# Patient Record
Sex: Female | Born: 1965
Health system: Southern US, Community
[De-identification: ages and names within clinical notes are randomized; demographics above are authoritative.]

## PROBLEM LIST (undated history)

## (undated) DIAGNOSIS — L209 Atopic dermatitis, unspecified: Secondary | ICD-10-CM

## (undated) DIAGNOSIS — D1803 Hemangioma of intra-abdominal structures: Secondary | ICD-10-CM

## (undated) DIAGNOSIS — N8003 Adenomyosis of the uterus: Secondary | ICD-10-CM

## (undated) DIAGNOSIS — G43909 Migraine, unspecified, not intractable, without status migrainosus: Secondary | ICD-10-CM

## (undated) DIAGNOSIS — I10 Essential (primary) hypertension: Secondary | ICD-10-CM

## (undated) DIAGNOSIS — R110 Nausea: Secondary | ICD-10-CM

## (undated) DIAGNOSIS — M7650 Patellar tendinitis, unspecified knee: Secondary | ICD-10-CM

## (undated) DIAGNOSIS — Z8742 Personal history of other diseases of the female genital tract: Secondary | ICD-10-CM

## (undated) DIAGNOSIS — I808 Phlebitis and thrombophlebitis of other sites: Secondary | ICD-10-CM

## (undated) DIAGNOSIS — E7800 Pure hypercholesterolemia, unspecified: Secondary | ICD-10-CM

## (undated) DIAGNOSIS — N8000 Endometriosis of the uterus, unspecified: Secondary | ICD-10-CM

## (undated) DIAGNOSIS — R140 Abdominal distension (gaseous): Secondary | ICD-10-CM

## (undated) DIAGNOSIS — E559 Vitamin D deficiency, unspecified: Secondary | ICD-10-CM

## (undated) DIAGNOSIS — G4730 Sleep apnea, unspecified: Secondary | ICD-10-CM

## (undated) DIAGNOSIS — K589 Irritable bowel syndrome without diarrhea: Secondary | ICD-10-CM

## (undated) DIAGNOSIS — R7303 Prediabetes: Secondary | ICD-10-CM

## (undated) DIAGNOSIS — R079 Chest pain, unspecified: Secondary | ICD-10-CM

## (undated) DIAGNOSIS — K769 Liver disease, unspecified: Secondary | ICD-10-CM

## (undated) DIAGNOSIS — Z8632 Personal history of gestational diabetes: Secondary | ICD-10-CM

## (undated) DIAGNOSIS — Z780 Asymptomatic menopausal state: Secondary | ICD-10-CM

## (undated) DIAGNOSIS — K5900 Constipation, unspecified: Secondary | ICD-10-CM

## (undated) DIAGNOSIS — Z7251 High risk heterosexual behavior: Secondary | ICD-10-CM

## (undated) DIAGNOSIS — K219 Gastro-esophageal reflux disease without esophagitis: Secondary | ICD-10-CM

## (undated) DIAGNOSIS — R202 Paresthesia of skin: Secondary | ICD-10-CM

## (undated) DIAGNOSIS — N809 Endometriosis, unspecified: Secondary | ICD-10-CM

## (undated) DIAGNOSIS — N393 Stress incontinence (female) (male): Secondary | ICD-10-CM

## (undated) DIAGNOSIS — Z9289 Personal history of other medical treatment: Secondary | ICD-10-CM

## (undated) DIAGNOSIS — R232 Flushing: Secondary | ICD-10-CM

## (undated) DIAGNOSIS — E119 Type 2 diabetes mellitus without complications: Secondary | ICD-10-CM

## (undated) DIAGNOSIS — N281 Cyst of kidney, acquired: Secondary | ICD-10-CM

## (undated) HISTORY — DX: Stress incontinence (female) (male): N39.3

## (undated) HISTORY — DX: Adenomyosis of the uterus: N80.03

## (undated) HISTORY — DX: Endometriosis of uterus: N80.0

## (undated) HISTORY — DX: Phlebitis and thrombophlebitis of other sites: I80.8

## (undated) HISTORY — DX: Essential (primary) hypertension: I10

## (undated) HISTORY — DX: Liver disease, unspecified: K76.9

## (undated) HISTORY — DX: Migraine, unspecified, not intractable, without status migrainosus: G43.909

## (undated) HISTORY — DX: Gastro-esophageal reflux disease without esophagitis: K21.9

## (undated) HISTORY — DX: Cyst of kidney, acquired: N28.1

## (undated) HISTORY — DX: Sleep apnea, unspecified: G47.30

## (undated) HISTORY — DX: Endometriosis of the uterus, unspecified: N80.00

## (undated) HISTORY — DX: Pure hypercholesterolemia, unspecified: E78.00

## (undated) HISTORY — DX: Personal history of other medical treatment: Z92.89

## (undated) HISTORY — DX: Asymptomatic menopausal state: Z78.0

## (undated) HISTORY — DX: Type 2 diabetes mellitus without complications: E11.9

## (undated) HISTORY — DX: Flushing: R23.2

## (undated) HISTORY — PX: OTHER SURGICAL HISTORY: SHX169

## (undated) HISTORY — DX: Irritable bowel syndrome, unspecified: K58.9

## (undated) HISTORY — DX: Constipation, unspecified: K59.00

## (undated) HISTORY — DX: Personal history of gestational diabetes: Z86.32

## (undated) HISTORY — DX: Patellar tendinitis, unspecified knee: M76.50

## (undated) HISTORY — DX: Atopic dermatitis, unspecified: L20.9

## (undated) HISTORY — DX: Personal history of other diseases of the female genital tract: Z87.42

## (undated) HISTORY — DX: Endometriosis, unspecified: N80.9

## (undated) HISTORY — DX: Prediabetes: R73.03

## (undated) HISTORY — DX: Chest pain, unspecified: R07.9

## (undated) HISTORY — PX: TUBAL LIGATION: SHX77

## (undated) HISTORY — DX: Vitamin D deficiency, unspecified: E55.9

## (undated) HISTORY — DX: Hemangioma of intra-abdominal structures: D18.03

---
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
## 1997-11-19 ENCOUNTER — Encounter: Admission: RE | Admit: 1997-11-19 | Discharge: 1997-11-19 | Payer: Self-pay | Admitting: Family Medicine

## 1997-12-11 ENCOUNTER — Encounter: Admission: RE | Admit: 1997-12-11 | Discharge: 1997-12-11 | Payer: Self-pay | Admitting: Family Medicine

## 1998-01-01 ENCOUNTER — Encounter: Admission: RE | Admit: 1998-01-01 | Discharge: 1998-01-01 | Payer: Self-pay | Admitting: Family Medicine

## 1998-01-06 ENCOUNTER — Encounter: Admission: RE | Admit: 1998-01-06 | Discharge: 1998-01-06 | Payer: Self-pay | Admitting: Family Medicine

## 1998-05-14 ENCOUNTER — Encounter: Admission: RE | Admit: 1998-05-14 | Discharge: 1998-05-14 | Payer: Self-pay | Admitting: Family Medicine

## 2000-04-04 DIAGNOSIS — Z9289 Personal history of other medical treatment: Secondary | ICD-10-CM

## 2000-04-04 HISTORY — DX: Personal history of other medical treatment: Z92.89

## 2000-05-05 HISTORY — PX: COLONOSCOPY: SHX174

## 2000-12-03 HISTORY — PX: CARDIOVASCULAR STRESS TEST: SHX262

## 2000-12-03 HISTORY — PX: SPIROMETRY: SHX456

## 2002-06-06 ENCOUNTER — Encounter: Admission: RE | Admit: 2002-06-06 | Discharge: 2002-06-06 | Payer: Self-pay | Admitting: Family Medicine

## 2002-06-19 ENCOUNTER — Encounter: Admission: RE | Admit: 2002-06-19 | Discharge: 2002-06-19 | Payer: Self-pay | Admitting: Family Medicine

## 2002-07-04 ENCOUNTER — Encounter: Admission: RE | Admit: 2002-07-04 | Discharge: 2002-07-04 | Payer: Self-pay | Admitting: Family Medicine

## 2002-07-11 ENCOUNTER — Encounter: Admission: RE | Admit: 2002-07-11 | Discharge: 2002-07-11 | Payer: Self-pay | Admitting: Family Medicine

## 2002-07-25 ENCOUNTER — Encounter: Admission: RE | Admit: 2002-07-25 | Discharge: 2002-07-25 | Payer: Self-pay | Admitting: Family Medicine

## 2002-11-11 ENCOUNTER — Encounter: Admission: RE | Admit: 2002-11-11 | Discharge: 2002-11-11 | Payer: Self-pay | Admitting: Family Medicine

## 2002-12-05 ENCOUNTER — Encounter: Admission: RE | Admit: 2002-12-05 | Discharge: 2002-12-05 | Payer: Self-pay | Admitting: Family Medicine

## 2003-08-07 ENCOUNTER — Encounter: Payer: Self-pay | Admitting: Family Medicine

## 2003-08-07 ENCOUNTER — Encounter: Admission: RE | Admit: 2003-08-07 | Discharge: 2003-08-07 | Payer: Self-pay | Admitting: Family Medicine

## 2004-01-15 ENCOUNTER — Ambulatory Visit: Payer: Self-pay | Admitting: Family Medicine

## 2005-02-02 ENCOUNTER — Encounter (INDEPENDENT_AMBULATORY_CARE_PROVIDER_SITE_OTHER): Payer: Self-pay | Admitting: *Deleted

## 2005-02-02 LAB — CONVERTED CEMR LAB

## 2005-02-04 ENCOUNTER — Ambulatory Visit: Payer: Self-pay | Admitting: Family Medicine

## 2005-02-21 ENCOUNTER — Encounter: Payer: Self-pay | Admitting: Family Medicine

## 2005-02-21 ENCOUNTER — Ambulatory Visit: Payer: Self-pay | Admitting: Family Medicine

## 2005-03-31 ENCOUNTER — Ambulatory Visit: Payer: Self-pay | Admitting: Family Medicine

## 2005-04-05 ENCOUNTER — Ambulatory Visit: Payer: Self-pay | Admitting: Sports Medicine

## 2005-04-19 ENCOUNTER — Encounter: Admission: RE | Admit: 2005-04-19 | Discharge: 2005-04-19 | Payer: Self-pay | Admitting: Sports Medicine

## 2006-02-20 ENCOUNTER — Ambulatory Visit: Payer: Self-pay | Admitting: Family Medicine

## 2006-06-01 DIAGNOSIS — E739 Lactose intolerance, unspecified: Secondary | ICD-10-CM | POA: Insufficient documentation

## 2006-06-01 DIAGNOSIS — I10 Essential (primary) hypertension: Secondary | ICD-10-CM | POA: Insufficient documentation

## 2006-06-01 DIAGNOSIS — G43909 Migraine, unspecified, not intractable, without status migrainosus: Secondary | ICD-10-CM | POA: Insufficient documentation

## 2006-06-01 DIAGNOSIS — K580 Irritable bowel syndrome with diarrhea: Secondary | ICD-10-CM | POA: Insufficient documentation

## 2006-06-01 DIAGNOSIS — K589 Irritable bowel syndrome without diarrhea: Secondary | ICD-10-CM | POA: Insufficient documentation

## 2006-06-01 DIAGNOSIS — E7800 Pure hypercholesterolemia, unspecified: Secondary | ICD-10-CM | POA: Insufficient documentation

## 2006-06-01 HISTORY — DX: Migraine, unspecified, not intractable, without status migrainosus: G43.909

## 2006-06-02 ENCOUNTER — Encounter (INDEPENDENT_AMBULATORY_CARE_PROVIDER_SITE_OTHER): Payer: Self-pay | Admitting: *Deleted

## 2006-10-02 ENCOUNTER — Telehealth (INDEPENDENT_AMBULATORY_CARE_PROVIDER_SITE_OTHER): Payer: Self-pay | Admitting: *Deleted

## 2006-10-09 ENCOUNTER — Ambulatory Visit: Payer: Self-pay | Admitting: Family Medicine

## 2006-10-09 ENCOUNTER — Ambulatory Visit (HOSPITAL_COMMUNITY): Admission: RE | Admit: 2006-10-09 | Discharge: 2006-10-09 | Payer: Self-pay | Admitting: Family Medicine

## 2006-10-11 ENCOUNTER — Encounter: Payer: Self-pay | Admitting: Family Medicine

## 2006-12-18 ENCOUNTER — Encounter: Payer: Self-pay | Admitting: *Deleted

## 2007-02-05 ENCOUNTER — Telehealth: Payer: Self-pay | Admitting: Family Medicine

## 2007-02-15 ENCOUNTER — Ambulatory Visit: Payer: Self-pay | Admitting: Family Medicine

## 2007-02-15 ENCOUNTER — Encounter: Payer: Self-pay | Admitting: Family Medicine

## 2007-02-15 LAB — CONVERTED CEMR LAB
Calcium: 9.7 mg/dL (ref 8.4–10.5)
Chlamydia, DNA Probe: NEGATIVE
GC Probe Amp, Genital: NEGATIVE
Hepatitis B Surface Ag: NEGATIVE
Potassium: 3.3 meq/L — ABNORMAL LOW (ref 3.5–5.3)
Whiff Test: POSITIVE

## 2007-02-16 ENCOUNTER — Encounter: Payer: Self-pay | Admitting: Family Medicine

## 2007-02-19 ENCOUNTER — Encounter: Payer: Self-pay | Admitting: Family Medicine

## 2007-06-04 ENCOUNTER — Emergency Department (HOSPITAL_COMMUNITY): Admission: EM | Admit: 2007-06-04 | Discharge: 2007-06-04 | Payer: Self-pay | Admitting: Emergency Medicine

## 2007-10-25 ENCOUNTER — Telehealth: Payer: Self-pay | Admitting: *Deleted

## 2007-10-26 ENCOUNTER — Ambulatory Visit: Payer: Self-pay | Admitting: Family Medicine

## 2007-11-01 ENCOUNTER — Encounter: Payer: Self-pay | Admitting: *Deleted

## 2008-01-11 ENCOUNTER — Encounter: Admission: RE | Admit: 2008-01-11 | Discharge: 2008-01-11 | Payer: Self-pay | Admitting: Family Medicine

## 2008-02-05 ENCOUNTER — Telehealth: Payer: Self-pay | Admitting: *Deleted

## 2008-02-22 DIAGNOSIS — E559 Vitamin D deficiency, unspecified: Secondary | ICD-10-CM | POA: Insufficient documentation

## 2008-02-22 LAB — CONVERTED CEMR LAB
Albumin: 4.6 g/dL
Alkaline Phosphatase: 44 units/L
CO2: 22 meq/L
Chlamydia, DNA Probe: NEGATIVE
Cholesterol: 256 mg/dL
Creatinine, Ser: 0.71 mg/dL
Glucose, Bld: 78 mg/dL
HDL: 85 mg/dL
Hep A IgM: NEGATIVE
Platelets: 259 10*3/uL
Potassium: 4.1 meq/L
RBC: 4.63 M/uL
RPR Ser Ql: NONREACTIVE
Sodium: 138 meq/L
T3 Uptake Ratio: 31 %
Total Protein: 8.1 g/dL
Triglycerides: 60 mg/dL

## 2008-03-03 ENCOUNTER — Ambulatory Visit: Payer: Self-pay | Admitting: Family Medicine

## 2008-09-04 ENCOUNTER — Ambulatory Visit: Payer: Self-pay | Admitting: Family Medicine

## 2008-09-04 ENCOUNTER — Telehealth (INDEPENDENT_AMBULATORY_CARE_PROVIDER_SITE_OTHER): Payer: Self-pay | Admitting: *Deleted

## 2008-09-04 ENCOUNTER — Telehealth: Payer: Self-pay | Admitting: *Deleted

## 2008-09-10 ENCOUNTER — Ambulatory Visit: Payer: Self-pay | Admitting: Family Medicine

## 2008-09-10 ENCOUNTER — Telehealth: Payer: Self-pay | Admitting: Family Medicine

## 2008-10-02 ENCOUNTER — Encounter: Payer: Self-pay | Admitting: Family Medicine

## 2008-10-02 ENCOUNTER — Ambulatory Visit: Payer: Self-pay | Admitting: Family Medicine

## 2009-06-30 ENCOUNTER — Telehealth: Payer: Self-pay | Admitting: Family Medicine

## 2009-07-15 ENCOUNTER — Encounter: Payer: Self-pay | Admitting: Family Medicine

## 2009-07-15 ENCOUNTER — Encounter: Admission: RE | Admit: 2009-07-15 | Discharge: 2009-07-15 | Payer: Self-pay | Admitting: Obstetrics and Gynecology

## 2009-07-31 ENCOUNTER — Ambulatory Visit: Payer: Self-pay | Admitting: Family Medicine

## 2009-07-31 ENCOUNTER — Encounter: Payer: Self-pay | Admitting: Family Medicine

## 2009-07-31 DIAGNOSIS — R5381 Other malaise: Secondary | ICD-10-CM | POA: Insufficient documentation

## 2009-07-31 DIAGNOSIS — R5383 Other fatigue: Secondary | ICD-10-CM

## 2009-07-31 DIAGNOSIS — M549 Dorsalgia, unspecified: Secondary | ICD-10-CM | POA: Insufficient documentation

## 2009-08-03 DIAGNOSIS — R7309 Other abnormal glucose: Secondary | ICD-10-CM | POA: Insufficient documentation

## 2009-08-03 LAB — CONVERTED CEMR LAB
Albumin: 4.2 g/dL (ref 3.5–5.2)
BUN: 15 mg/dL (ref 6–23)
Calcium: 9.4 mg/dL (ref 8.4–10.5)
Chloride: 103 meq/L (ref 96–112)
Creatinine, Ser: 0.95 mg/dL (ref 0.40–1.20)
MCHC: 34.1 g/dL (ref 30.0–36.0)
Potassium: 3.6 meq/L (ref 3.5–5.3)
Sodium: 139 meq/L (ref 135–145)
TSH: 0.471 microintl units/mL (ref 0.350–4.500)
Total Protein: 7.2 g/dL (ref 6.0–8.3)
WBC: 6.5 10*3/uL (ref 4.0–10.5)

## 2010-05-02 LAB — CONVERTED CEMR LAB
ALT: 16 units/L (ref 0–35)
BUN: 11 mg/dL (ref 6–23)
Bilirubin Urine: NEGATIVE
Chloride: 104 meq/L (ref 96–112)
Ketones, urine, test strip: NEGATIVE
Potassium: 4.3 meq/L (ref 3.5–5.3)
Total Protein: 7.5 g/dL (ref 6.0–8.3)
pH: 7

## 2010-05-04 NOTE — Progress Notes (Signed)
Summary: Rx Req  Phone Note Refill Request Call back at [phone] Message from:  Patient  Refills Requested: Medication #1:  HYDROCHLOROTHIAZIDE 25 MG TABS 1 tab by mouth daily. PT USES [facility] IN [place].  Initial call taken by: Clydell Hakim,  June 30, 2009 3:49 PM  Follow-up for Phone Call        to pcp Follow-up by: Theresia Lo RN,  June 30, 2009 4:04 PM    Prescriptions: HYDROCHLOROTHIAZIDE 25 MG TABS (HYDROCHLOROTHIAZIDE) 1 tab by mouth daily.  #30 x 5   Entered and Authorized by:   Tawanna Cooler Morrill Bomkamp MD   Signed by:   Tawanna Cooler Leibish Mcgregor MD on 07/01/2009   Method used:   Electronically to        [facility]. [phone]* (retail)       [address]       [place], [place]  [address]       Ph: [phone] or [phone]       Fax: [phone]   RxID:   [id number]

## 2010-05-04 NOTE — Assessment & Plan Note (Signed)
Summary: Mondor's Disease,tcb   Vital Signs:  Patient profile:   45 year old female Height:      64.5 inches Weight:      166 pounds BMI:     28.16 BSA:     1.82 Temp:     98.3 degrees F Pulse rate:   89 / minute BP sitting:   131 / 88  Vitals Entered By: Jone Baseman [name] (July 31, 2009 1:38 PM) CC: f/u mammogram, upper back pain, fatigue, HTN Is Patient Diabetic? No Pain Assessment Patient in pain? no        Primary Care Provider:  TODD MCDIARMID MD  CC:  f/u mammogram, upper back pain, fatigue, and HTN.  History of Present Illness: mammogram: dx with mondors disease and also rash on breasts.  rash is chronic - improved in past with cream which she doesn't remember the name of.  typically rash is itching and skin looks darker in the area of the rash. no longer having breast pain.  was instructed by mammographer to take asa 325 once daily.  she wishes to stop this if possible.  initially had severe breast pain which prompted assessment in the first place.  denies fevers, overall feeling well with regards to breast health.  knows she has 6 mo follow up of mammogram.   upper back pain: briefly mentioned by patient.  intermittent for about the past 4 months.  occurs randomly, lasts about 2 hrs or so at a time.  probably occurs every other day. denies association with food.  no associated nausea or fevers.  never had this before.  pain is kind of under shoulder blade on right side.  no associated abdominal pain.  no associated shortness of breath.    fatigue: also has noticed for several months.  remembers feeling like this when vitamin D level was low in past.  requesting lab work today to see if again vitamin D deficiency or other laboratory cause.  she denies feeling depressed.  HTN: taking HCTZ as prescribed.  states at home she has noticed some elevation of blood pressure but is suprised is fairly good today.  hasn't previously been keeping an eye on BP at home. denies any  chest pain or dizziness or leg swelling assocaited with antihypertensive meds.  Habits & Providers  Alcohol-Tobacco-Diet     Tobacco Status: never  Current Medications (verified): 1)  Hydrochlorothiazide 25 Mg Tabs (Hydrochlorothiazide) .[name].. 1 Tab By Mouth Daily. 2)  Nystatin 100000 Unit/gm Crea (Nystatin) .... Apply To Affected Area Two Times A Day Until 2-3 Days After Healed and Then As Needed. Disp 120g  Allergies (verified): 1)  Ace Inhibitors  Past History:  Past medical, surgical, family and social histories (including risk factors) reviewed for relevance to current acute and chronic problems.  Past Medical History: Reviewed history from 03/03/2008 and no changes required. Candida vaginitis and intertrigo, Eczema, nonspecific Dx by Vaughan Sine, possibly secondary to ACE Inhibitor [place] 10-yr CVE Risk <= 1%,  Gestational diabetes,  Hx colitis on flex sig Hodges Fam Prac. 2/02,  Overwieght, Snoring Serum Vitamen D 14 ng/mL LOW  (32-100) HSV I & II IgG < 0.9 which means Negative (11/09) FSH =  19.9 mIU/mL c/w ovulation (11/09) LH = 15.2 mIU/mL c/w ovulation (11/09)  Gyn care at [facility]  Past Surgical History: Reviewed history from 10/09/2006 and no changes required. Colonoscopy_DrGupta,Int hemorr. - 05/05/2000,  CXR normal - 12/03/2000,  Flexible Sigmoidoscopy - 05/05/2000, HBV vaccination 3-series completed `93 -  01/03/1992, Lipids 02/20/06 TC=239, LDL=157, HDL=59, Trig=72 - 02/21/2006 PFTs normal - 12/03/2000,  Stress Echocargiogram - 12/03/2000,  Tubal ligation -,  Ur. Bladder `stretched` -  Family History: Reviewed history from 03/03/2008 and no changes required. Aunt & first cousin with colorectal cancer,  Fa died Pancreatic Cancer at age 34 w/ Permanent PM in place; Family History Diabetes 1st degree relative: one brother with DMT2  Social History: Reviewed history from 09/10/2008 and no changes required. Married at age 19yrs old, [id number].    Patient has never had any other sexual partner other than husband though relation has not been mutually monogaqmous. 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 no tobacco no alcohol no illicit drugs  Review of Systems       per HPI  Physical Exam  General:  Well-developed,well-nourished,in no acute distress; alert,appropriate and cooperative throughout examination VS noted Head:  normocephalic and atraumatic.   Breasts:  No mass, nodules, thickening, tenderness, bulging, retraction, inflamation, nipple discharge or skin changes noted.   Lungs:  Normal respiratory effort, chest expands symmetrically. Lungs are clear to auscultation, no crackles or wheezes. Heart:  Normal rate and regular rhythm. S1 and S2 normal without gallop, murmur, click, rub or other extra sounds. Abdomen:  Bowel sounds positive,abdomen soft and non-tender without masses, organomegaly or hernias noted. Msk:  back nontender.  unable to elicit pain patient describes.  Skin:  intergrigo rash under breasts and between breasts bilaterally with associated hyperpigmentation.  Psych:  Oriented X3, normally interactive, good eye contact, not anxious appearing, and not depressed appearing.     Impression & Recommendations:  Problem # 1:  MONDOR'S DISEASE, RIGHT BREAST (ICD-451.89) Assessment Improved can stop ASA.  f/u mammogram as scheduled.  no other screening indicated at this time based on literature.  asa was for pain only.  Orders: [facility]- Est  Level 4 ([address]109)  Problem # 2:  HYPERTENSION, BENIGN SYSTEMIC (ICD-401.1) Assessment: Unchanged  currently at goal.  monitor at home to make sure stays there and doesn't need additional meds. check cmet today as well Her updated medication list for this problem includes:    Hydrochlorothiazide 25 Mg Tabs (Hydrochlorothiazide) .[name]... 1 tab by mouth daily.  Orders: Comp Met-[facility] [phone]) [facility]- Est  Level 4 (99214)  BP today: 131/88 Prior BP:  124/84 (10/02/2008)  Prior 10 Yr Risk Heart Disease: 2 % (10/02/2008)  Labs Reviewed: K+: 4.1 (02/22/2008) Creat: : 0.71 (02/22/2008)   Chol: 256 (02/22/2008)   HDL: 85 (02/22/2008)   LDL: 159 (02/22/2008)   TG: 60 (02/22/2008)  Problem # 3:  FATIGUE (ICD-780.79) Assessment: New check lab work as requested for first step in eval.   Orders: CBC-[facility] (91478) TSH-[facility] ([id number]) [facility]- Est  Level 4 (57846)  Problem # 4:  BACK PAIN, UPPER (ICD-724.5) Assessment: New ? if related to possible gall bladder disease.  have asked her to keep diary of when this occurs and see if there is anything she can relate it to. she agreed.  return if worsens acutely.  Orders: [facility]- Est  Level 4 ([address]295)  Complete Medication List: 1)  Hydrochlorothiazide 25 Mg Tabs (Hydrochlorothiazide) .[name].. 1 tab by mouth daily. 2)  Nystatin 100000 Unit/gm Crea (Nystatin) .... Apply to affected area two times a day until 2-3 days after healed and then as needed. disp 120g  Other Orders: Vit D, 25 [facility] ([id number])  Patient Instructions: 1)  Use the cream for your breast until 2-3 days after rash resolves and then as needed.  2)  Keep an eye on when your back pain occurs and see if you can relate it to anything.   3)  It is okay to stop the aspirin and to go back to work.  4)  We will call you if there is anything abnormal with your lab work today.  5)  It was nice to meet you. Prescriptions: NYSTATIN 100000 UNIT/GM CREA (NYSTATIN) apply to affected area two times a day until 2-3 days after healed and then as needed. Disp 120g  #120 x 3   Entered and Authorized by:   Ancil Boozer  MD   Signed by:   Ancil Boozer  MD on 07/31/2009   Method used:   Electronically to        [facility]. [phone]* (retail)       [address]       [place], [place]  [address]       Ph: [phone] or [phone]       Fax: [phone]   RxID:   [id number]   Prevention & Chronic  Care Immunizations   Influenza vaccine: Not documented    Tetanus booster: 02/03/2003: Done.   Tetanus booster due: 02/02/2013    Pneumococcal vaccine: Not documented  Other Screening   Pap smear: normal  (02/04/2008)   Pap smear due: 02/04/2011    Mammogram: abnormal  (07/27/2009)   Mammogram due: 01/26/2010   Smoking status: never  (07/31/2009)  Lipids   Total Cholesterol: 256  (02/22/2008)   LDL: 159  (02/22/2008)   LDL Direct: Not documented   HDL: 85  (02/22/2008)   Triglycerides: 60  (02/22/2008)    SGOT (AST): 24  (02/22/2008)   SGPT (ALT): 15  (02/22/2008) CMP ordered    Alkaline phosphatase: 44  (02/22/2008)   Total bilirubin: 1.3  (02/22/2008)    Lipid flowsheet reviewed?: Yes   Progress toward LDL goal: Unchanged  Hypertension   Last Blood Pressure: 131 / 88  (07/31/2009)   Serum creatinine: 0.71  (02/22/2008)   Serum potassium 4.1  (02/22/2008) CMP ordered     Hypertension flowsheet reviewed?: Yes   Progress toward BP goal: At goal  Self-Management Support :   Personal Goals (by the next clinic visit) :      Personal blood pressure goal: 140/90  (07/31/2009)     Personal LDL goal: 130  (07/31/2009)    Hypertension self-management support: Not documented    Hypertension self-management support not done because: Good outcomes  (07/31/2009)    Lipid self-management support: Not documented     Lipid self-management support not done because: Not indicated  (07/31/2009)    Self-management comments: patient knows she is due for FLP - plans to schedule appt to get labwork to see if medications are indicated at this point in time

## 2010-05-10 ENCOUNTER — Encounter: Payer: Self-pay | Admitting: *Deleted

## 2010-05-31 ENCOUNTER — Encounter: Payer: Self-pay | Admitting: Family Medicine

## 2010-05-31 ENCOUNTER — Ambulatory Visit (INDEPENDENT_AMBULATORY_CARE_PROVIDER_SITE_OTHER): Payer: PRIVATE HEALTH INSURANCE | Admitting: Family Medicine

## 2010-05-31 VITALS — BP 160/82 | HR 78 | Temp 98.3°F | Ht 64.0 in | Wt 161.8 lb

## 2010-05-31 DIAGNOSIS — N951 Menopausal and female climacteric states: Secondary | ICD-10-CM

## 2010-05-31 DIAGNOSIS — E739 Lactose intolerance, unspecified: Secondary | ICD-10-CM

## 2010-05-31 DIAGNOSIS — E559 Vitamin D deficiency, unspecified: Secondary | ICD-10-CM

## 2010-05-31 DIAGNOSIS — E7800 Pure hypercholesterolemia, unspecified: Secondary | ICD-10-CM

## 2010-05-31 DIAGNOSIS — I10 Essential (primary) hypertension: Secondary | ICD-10-CM

## 2010-05-31 DIAGNOSIS — N760 Acute vaginitis: Secondary | ICD-10-CM | POA: Insufficient documentation

## 2010-05-31 DIAGNOSIS — Z124 Encounter for screening for malignant neoplasm of cervix: Secondary | ICD-10-CM

## 2010-05-31 DIAGNOSIS — Z01419 Encounter for gynecological examination (general) (routine) without abnormal findings: Secondary | ICD-10-CM

## 2010-05-31 LAB — POCT WET PREP (WET MOUNT)

## 2010-05-31 MED ORDER — ERGOCALCIFEROL 1.25 MG (50000 UT) PO CAPS: 50000.0000 [IU] | ORAL_CAPSULE | ORAL | Status: DC

## 2010-05-31 MED ORDER — HYDROCHLOROTHIAZIDE 25 MG PO TABS: 25.0000 mg | ORAL_TABLET | Freq: Every day | ORAL | Status: DC

## 2010-05-31 NOTE — Patient Instructions (Addendum)
Please come by at your convenience to our [facility] lab for electrolytes and blood sugar test.   If your blood pressures are averaging over 140 /90, please let Dr Adalie Mand know, a low dose of a calcium channel blocker like amlodipine would be a good medication we could add to your HCTZ antihypertensive medication.   If your pre-menopausal symptoms become bad enough that you want to consider medication treatment, please let Dr Korinne Greenstein know.   Once you complete your load of ergocalciferol (Vitamin D2) for eight weeks, then start cholcalciferol Vitamin D of 1000 units a day by mouth.  It is available over-the-counter.   Dr Tayshun Gappa will call you with the results of your wet prep today.

## 2010-06-01 ENCOUNTER — Encounter: Payer: Self-pay | Admitting: Family Medicine

## 2010-06-01 NOTE — Assessment & Plan Note (Addendum)
Given family history of diabetes in both parents and patient's personal history of gestational diabetes and glucose intolerance, will check random serum glucose.

## 2010-06-01 NOTE — Progress Notes (Signed)
  Subjective:     Melanie Cole is a 45 y.o. female and is here for a comprehensive physical exam. The patient reports intermittent menstrual period for last year.  Last menses about three months ago. Having vasomotor symptoms. . CHRONIC HYPERTENSION  Disease Monitoring  Blood pressure range: on avergage 14 0/85 to 90 at home  Chest pain: no   Dyspnea: no   Claudication: no   Medication compliance: yes, but has not taken HCTZ consistently because she has difficulty taking diuretic when working b/c of difficulty getting to [place]. Medication Side Effects  Lightheadedness: no   Urinary frequency: yes   Edema: no     History   Social History  . Marital Status: Married    Spouse Name: N/A    Number of Children: N/A  . Years of Education: N/A   Occupational History  . Registered Nurse    Social History Main Topics  . Smoking status: Never Smoker   . Smokeless tobacco: Not on file  . Alcohol Use: No  . Drug Use: No  . Sexually Active: Not on file   Other Topics Concern  . Not on file   Social History Narrative   Married at age 54yrs old, [phone]. 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 Pap Smear  02/04/2011  . Tetanus/tdap  02/02/2013    The following portions of the patient's history were reviewed and updated as appropriate: allergies, current medications, past family history, past medical history, past social history, past surgical history and problem list.  Review of Systems A comprehensive review of systems was negative except for: Genitourinary: positive for abnormal menstrual periods, hot flashes and vaginal dryness   Objective:    BP 160/82  Pulse 78  Temp(Src) 98.3 F (36.8 C) (Oral)  Ht 5\' 4"  (1.626 m)  Wt 161 lb 12.8 oz (73.392 kg)  BMI 27.77 kg/m2  LMP 03/23/2010 General appearance: alert and no distress Head: Normocephalic, without obvious abnormality,  atraumatic Throat: normal findings: oropharynx pink & moist without lesions or evidence of thrush Neck: no adenopathy, no carotid bruit, no JVD, supple, symmetrical, trachea midline and thyroid not enlarged, symmetric, no tenderness/mass/nodules Lungs: clear to auscultation bilaterally Breasts: normal appearance, no masses or tenderness, Inspection negative, No nipple retraction or dimpling, No nipple discharge or bleeding, No axillary or supraclavicular adenopathy, Normal to palpation without dominant masses Heart: regular rate and rhythm, S1, S2 normal, no murmur, click, rub or gallop Abdomen: soft, non-tender; bowel sounds normal; no masses,  no organomegaly Pelvic: cervix normal in appearance, external genitalia normal, no adnexal masses or tenderness, no cervical motion tenderness, rectovaginal septum normal, uterus normal size, shape, and consistency and vagina normal without discharge    Assessment:    Healthy female exam. In Climacteric phase (pre-menopausal as evidenced by oligomenorrhea and vasomotor symptoms     Plan:     See After Visit Summary for Counseling Recommendations

## 2010-06-01 NOTE — Assessment & Plan Note (Signed)
Patient has not been taking Vitamin D supplement for some time.  Recommend that re-load with Ergocalciferol 50,000 units weekly for 8 weeks then 1000 units daily of OTC cholcalciferol indefinitely.

## 2010-06-01 NOTE — Assessment & Plan Note (Signed)
Patient reports average home BP of around 140/85.  She will continue to monitor at home, and if elevated > 140/90 on average she will contact our office for consideration of starting Amlodipine. Will check BMET given HCTZ therapy.

## 2010-06-02 ENCOUNTER — Other Ambulatory Visit: Payer: PRIVATE HEALTH INSURANCE

## 2010-06-02 DIAGNOSIS — I10 Essential (primary) hypertension: Secondary | ICD-10-CM

## 2010-06-02 LAB — BASIC METABOLIC PANEL
Creat: 0.8 mg/dL (ref 0.40–1.20)
Sodium: 138 mEq/L (ref 135–145)

## 2010-06-02 LAB — CONVERTED CEMR LAB
CO2: 25 meq/L (ref 19–32)
Calcium: 9.3 mg/dL (ref 8.4–10.5)
Chloride: 103 meq/L (ref 96–112)
Glucose, Bld: 80 mg/dL (ref 70–99)
Sodium: 138 meq/L (ref 135–145)

## 2010-06-03 ENCOUNTER — Encounter: Payer: Self-pay | Admitting: Family Medicine

## 2010-06-15 ENCOUNTER — Telehealth: Payer: Self-pay | Admitting: Family Medicine

## 2010-06-15 DIAGNOSIS — I10 Essential (primary) hypertension: Secondary | ICD-10-CM

## 2010-06-15 NOTE — Telephone Encounter (Signed)
Melanie Cole returned your call during her break from Orientation and wanted you to call her when possible and lv msg about meds.

## 2010-06-15 NOTE — Telephone Encounter (Signed)
Pt requesting to be put on an additional bp med, says it was discussed at last visit but pt told MD she wanted to try to work on it herself. Pts last 2 bp readings were 152/97 & 153/99, pt not feeling well.

## 2010-06-15 NOTE — Telephone Encounter (Signed)
Will route to Dr. McDiarmid.

## 2010-06-15 NOTE — Telephone Encounter (Signed)
Returned call.  No answer nor VM to leave a message.  Will try again later.

## 2010-06-15 NOTE — Telephone Encounter (Signed)
Patient saw Dr. Perley Jain on 2/27 where they discussed adding Amlodipine to her medication regime.  At the time patient declined but now feels that she needs it due to recent high BP readings.  BP yesterday was 152/97 and today was 153/99.  Pt also c/o HA and dizziness but she also reported these same sx to him on the 27th.   New sx over th past 3 weeks include intermittent, mild chest pain and left arm pain.  Most recent episode was last night and lasted about 5 minutes.  Expressed my concern over this and wanted her to come in to be evaluated.  Stated that she could not due to starting a new job 2 weeks ago.  Told her that she needed to present to [facility] or ED immediately if sx returned and worsened.  In the meantime I would route this note to Dr. McDiarmid and would make sure he sees it first thing tomorrow am.  Pt agreeable.

## 2010-06-16 ENCOUNTER — Telehealth: Payer: Self-pay | Admitting: Family Medicine

## 2010-06-16 MED ORDER — AMLODIPINE BESYLATE 5 MG PO TABS: 5.0000 mg | ORAL_TABLET | Freq: Every day | ORAL | Status: DC

## 2010-06-16 NOTE — Telephone Encounter (Signed)
Left message on pt's phone [phone]) that Rx for Amlodipine 5 mg sent to pharmacy where she gets her HCTZ.  If BP is still in 150s/90s range on amlodipine 5 mg daily after a week, then ask pt to self-titrate to two 5 mg tablets daily and to let us know of the increase in dose. If chest pain worsens or fails to improve, I asked patient to let us know.  If chest pain persists more than 10 minutes, she should go to ED. Pt had a reassuring Stress Echocardiogram but it was nearly 10 years ago.

## 2010-06-16 NOTE — Telephone Encounter (Signed)
Read patient  message from MD form this AM and she will call back in a week to let us know how BP is running.

## 2010-06-16 NOTE — Telephone Encounter (Signed)
Requesting to speak with RN re: her bp being high, spoke with RN yesterday.

## 2010-06-18 ENCOUNTER — Telehealth: Payer: Self-pay | Admitting: Family Medicine

## 2010-06-18 NOTE — Telephone Encounter (Signed)
Wants to know if she can come in today for EKG and labs - had talked with nurse this week about this.

## 2010-06-18 NOTE — Telephone Encounter (Signed)
Called and left message on voicemail for patient to call back. Advised on message if she is having chect pain she needs to go to ER. Advised she will need appointment with MD for  EKG and labs and at this current time we do have 2 work in appointment available today but to call back soon.

## 2010-06-18 NOTE — Telephone Encounter (Signed)
No answer at home phone.  Mobile phone was a Materials engineer. Left message asking Ms Whirley to increase her amlodipine to 2 tablets of Amlodipine 5 mg tablets to see if her blood pressure and headache will improve. I asked her to go to Urgent care or Ed  if she is having persistent chest pain. She can call on by Monday to let us know if she would like to be put into my Monday, 3/19, afternoon clinic. It would be OK to double book her an appointment in that clinic.

## 2010-06-18 NOTE — Telephone Encounter (Signed)
Spoke with patient and she advises that yesterday she did experience chest pain off and on. Today BP reading was 152/98.  Today is third day of amlopidine.  States today she has a tension pressure sensation behind eye. Offered work in appointment today  but she cannot come until after 4:00 . Advised again of Dr. McDiarmid's message from a few days ago and advised to go to ER or Urgent Care if symptoms persist .

## 2010-07-04 ENCOUNTER — Other Ambulatory Visit: Payer: Self-pay | Admitting: Family Medicine

## 2010-07-04 NOTE — Telephone Encounter (Signed)
Refill request

## 2010-07-05 ENCOUNTER — Other Ambulatory Visit: Payer: Self-pay | Admitting: Family Medicine

## 2010-07-05 MED ORDER — HYDROCHLOROTHIAZIDE 25 MG PO TABS: 25.0000 mg | ORAL_TABLET | Freq: Every day | ORAL | Status: DC

## 2010-07-05 NOTE — Telephone Encounter (Signed)
Refill request

## 2010-07-08 ENCOUNTER — Telehealth: Payer: Self-pay | Admitting: Family Medicine

## 2010-07-08 DIAGNOSIS — I10 Essential (primary) hypertension: Secondary | ICD-10-CM

## 2010-07-08 MED ORDER — AMLODIPINE BESYLATE 5 MG PO TABS: 10.0000 mg | ORAL_TABLET | Freq: Every day | ORAL | Status: DC

## 2010-07-08 NOTE — Telephone Encounter (Signed)
Pt was increased on her Amlodipine and now is about out, please call in another refill to [facility]  Dr Levonne Lapping. Is on vacation next week.

## 2010-07-08 NOTE — Telephone Encounter (Signed)
Patient states she is now taking two of the 5 mg amlopidine daily.  Chest pain has improved , headache are just mild now. BP ranging 140/84 she states. Rx refilled by Dr. Deirdre Priest and advised patient to follow up with MD.

## 2010-08-09 ENCOUNTER — Encounter: Payer: Self-pay | Admitting: Family Medicine

## 2010-08-09 ENCOUNTER — Ambulatory Visit (INDEPENDENT_AMBULATORY_CARE_PROVIDER_SITE_OTHER): Payer: 59 | Admitting: Family Medicine

## 2010-08-09 VITALS — BP 152/95 | HR 83 | Temp 98.0°F | Wt 162.3 lb

## 2010-08-09 DIAGNOSIS — I10 Essential (primary) hypertension: Secondary | ICD-10-CM

## 2010-08-09 DIAGNOSIS — R519 Headache, unspecified: Secondary | ICD-10-CM

## 2010-08-09 DIAGNOSIS — R51 Headache: Secondary | ICD-10-CM

## 2010-08-09 MED ORDER — METOPROLOL SUCCINATE ER 50 MG PO TB24: 50.0000 mg | ORAL_TABLET | Freq: Every day | ORAL | Status: DC

## 2010-08-09 NOTE — Patient Instructions (Addendum)
Your BP in Left Arm was 142/95, right Arm was 150/100. Your weight was 162 pounds. BMI is 28.9%.  Start Metoprolol XL 50 mg daily at bedtime to help with blood pressure and see if it helps decrease the frequency and intensity of your headaches.  Continue your Amlodipine and Hydrochlorothiazide.   Keep a Blood Pressure diary with readings scattered throughout the day.   Call the [facility] the day before the day you want to come by for your fasting cholesterol panel.([phone])

## 2010-08-10 ENCOUNTER — Encounter: Payer: Self-pay | Admitting: Family Medicine

## 2010-08-10 DIAGNOSIS — R519 Headache, unspecified: Secondary | ICD-10-CM | POA: Insufficient documentation

## 2010-08-10 NOTE — Progress Notes (Signed)
  Subjective:    Patient ID: Melanie Cole, female    DOB: [birth year], 45 y.o.   MRN: [id number]  HPI  CHRONIC HYPERTENSION  Disease Monitoring  Blood pressure range: Home monitor variable with 120 -150/ 80 - 110  Chest pain: no, improved with increase in amlodipine to 10 mg daily   Dyspnea: no   Claudication: no   Medication compliance: yes  Medication Side Effects  Lightheadedness: no   Urinary frequency: no   Edema: no     Preventitive Healthcare:  Exercise: yes   Diet Pattern: works on watching weight  Salt Restriction: yes  HEADACHE   Onset: several months, started before Amlodipine tx initiated Location: Sharp brief pains parietal, temporal,and frontal  & aching pain behind both eyes  Frequency: three times a week.  Aching pain can last for several hours Precipitating factors: aching headache may have worsened with Amlodipine therapy initiation Prior treatment: none  Associated Symptoms Nausea/vomiting: no  Photophobia/phonophobia: no  Tearing of eyes: no  Sinus pain/pressure: no  Can continue working with pain but prefers to lay down No worsening of headache with activity  Sleep may or may not relieve headache Has had only a handful of headaches in entire life until several months ago.   Personal stressors: yes  Relation to menstrual cycle: no.  Menses have become irregular.  Red Flags Fever: no  Neck pain/stiffness: no  Vision/speech/swallow/hearing difficulty: no  Focal weakness/numbness: no  Altered mental status: no  Trauma: no  New type of headache: yes  Anticoagulant use: no  H/o cancer/HIV/Pregnancy: no        Review of Systems See HPI Medications, past medical history,  family history, social history were reviewed and updated.      Objective:   Physical Exam  Constitutional: She appears well-developed and well-nourished. No distress.  HENT:  Head: Normocephalic.    Right Ear: External ear normal.  Left Ear: External ear normal.    Nose: Nose normal.  Mouth/Throat: Oropharynx is clear and moist.  Eyes: Conjunctivae and EOM are normal. Pupils are equal, round, and reactive to light.  Fundoscopic exam:      The right eye shows no papilledema.       The left eye shows no papilledema.    Cardiovascular: Normal rate, regular rhythm, normal heart sounds and intact distal pulses.   Pulmonary/Chest: Effort normal and breath sounds normal.  Musculoskeletal: She exhibits no edema.  Neurological: She is alert. She has normal strength. She is not disoriented. No cranial nerve deficit. Coordination and gait normal.  Psychiatric: She has a normal mood and affect. Her speech is normal and behavior is normal. Thought content normal. Cognition and memory are normal.          Assessment & Plan:

## 2010-08-10 NOTE — Assessment & Plan Note (Addendum)
Improved but inadequate BP control currently on maximum daily dosages of Amlodipine and HCTZ.  Will add Metoprolol XL 50 mg daily for BP control and possible prophylaxis of headache (likely TTH but may have migrainous component).  RTC in 3 weeks for recheck of BP and medication tolerance.  Amlodipine could be worsening the pre-existing headache, which will need to be kept in mind as a possible component of a multifactorial headache.  Melanie Cole is to come in to [facility] in near future for fasting Lipid panel.

## 2010-08-10 NOTE — Assessment & Plan Note (Addendum)
Historical findings of recurrent, non-throbbing, bilateral, mild-to-moderate, non-disabling headache less than 15 days a month without red flag findings fits the IHS criteria for episodic tension-type headache.    Plan: OTC Analgesic (less than 15 times a month).  Avoid caffeine. Avoid skipping meals.  While likely to have a neutral effect on tension headaches, will start metoprolol XL 50 mg daily for her hypertension that could have benefit if there is a migrainous component to headaches.   Interestingly, her problem list contains migraine that entered her list from an old EMR from before 05/2006.

## 2010-08-26 ENCOUNTER — Other Ambulatory Visit: Payer: 59

## 2010-08-27 ENCOUNTER — Other Ambulatory Visit: Payer: 59

## 2010-08-27 DIAGNOSIS — I10 Essential (primary) hypertension: Secondary | ICD-10-CM

## 2010-08-27 LAB — LIPID PANEL
HDL: 80 mg/dL (ref >39)
LDL Cholesterol: 187 mg/dL — ABNORMAL HIGH (ref 0–99)

## 2010-08-27 NOTE — Progress Notes (Signed)
flp done today Melanie Cole 

## 2010-08-29 ENCOUNTER — Other Ambulatory Visit: Payer: Self-pay | Admitting: Family Medicine

## 2010-08-29 DIAGNOSIS — I10 Essential (primary) hypertension: Secondary | ICD-10-CM

## 2010-08-30 NOTE — Telephone Encounter (Signed)
Refill request

## 2010-08-31 ENCOUNTER — Encounter: Payer: Self-pay | Admitting: Family Medicine

## 2010-08-31 MED ORDER — AMLODIPINE BESYLATE 10 MG PO TABS: 10.0000 mg | ORAL_TABLET | Freq: Every day | ORAL | Status: DC

## 2010-08-31 NOTE — Telephone Encounter (Signed)
This encounter was created in error - please disregard.

## 2010-09-06 ENCOUNTER — Ambulatory Visit (INDEPENDENT_AMBULATORY_CARE_PROVIDER_SITE_OTHER): Payer: 59 | Admitting: Family Medicine

## 2010-09-06 ENCOUNTER — Encounter: Payer: Self-pay | Admitting: Family Medicine

## 2010-09-06 DIAGNOSIS — R232 Flushing: Secondary | ICD-10-CM

## 2010-09-06 DIAGNOSIS — R109 Unspecified abdominal pain: Secondary | ICD-10-CM

## 2010-09-06 DIAGNOSIS — R1084 Generalized abdominal pain: Secondary | ICD-10-CM

## 2010-09-06 DIAGNOSIS — I10 Essential (primary) hypertension: Secondary | ICD-10-CM

## 2010-09-06 DIAGNOSIS — R0609 Other forms of dyspnea: Secondary | ICD-10-CM

## 2010-09-06 DIAGNOSIS — R0989 Other specified symptoms and signs involving the circulatory and respiratory systems: Secondary | ICD-10-CM

## 2010-09-06 DIAGNOSIS — R0600 Dyspnea, unspecified: Secondary | ICD-10-CM

## 2010-09-06 DIAGNOSIS — N915 Oligomenorrhea, unspecified: Secondary | ICD-10-CM

## 2010-09-06 DIAGNOSIS — N951 Menopausal and female climacteric states: Secondary | ICD-10-CM

## 2010-09-06 DIAGNOSIS — R1031 Right lower quadrant pain: Secondary | ICD-10-CM

## 2010-09-06 DIAGNOSIS — G8929 Other chronic pain: Secondary | ICD-10-CM | POA: Insufficient documentation

## 2010-09-07 ENCOUNTER — Telehealth: Payer: Self-pay | Admitting: *Deleted

## 2010-09-07 ENCOUNTER — Other Ambulatory Visit: Payer: 59

## 2010-09-07 ENCOUNTER — Encounter: Payer: Self-pay | Admitting: Family Medicine

## 2010-09-07 DIAGNOSIS — R232 Flushing: Secondary | ICD-10-CM

## 2010-09-07 DIAGNOSIS — N915 Oligomenorrhea, unspecified: Secondary | ICD-10-CM

## 2010-09-07 DIAGNOSIS — R0600 Dyspnea, unspecified: Secondary | ICD-10-CM

## 2010-09-07 DIAGNOSIS — R1031 Right lower quadrant pain: Secondary | ICD-10-CM | POA: Insufficient documentation

## 2010-09-07 LAB — LIPASE: Lipase: 35 U/L (ref 0–75)

## 2010-09-07 LAB — CBC
MCH: 31 pg (ref 26.0–34.0)
MCHC: 33.4 g/dL (ref 30.0–36.0)
MCV: 92.8 fL (ref 78.0–100.0)
Platelets: 255 10*3/uL (ref 150–400)
RBC: 4.32 MIL/uL (ref 3.87–5.11)
RDW: 13.6 % (ref 11.5–15.5)

## 2010-09-07 LAB — COMPREHENSIVE METABOLIC PANEL
ALT: 11 U/L (ref 0–35)
Albumin: 4.4 g/dL (ref 3.5–5.2)
Alkaline Phosphatase: 54 U/L (ref 39–117)
Glucose, Bld: 102 mg/dL — ABNORMAL HIGH (ref 70–99)
Potassium: 3.5 mEq/L (ref 3.5–5.3)
Sodium: 137 mEq/L (ref 135–145)
Total Bilirubin: 0.8 mg/dL (ref 0.3–1.2)
Total Protein: 7.1 g/dL (ref 6.0–8.3)

## 2010-09-07 NOTE — Progress Notes (Signed)
Subjective:    Patient ID: Melanie Cole, female    DOB: 1965/06/12, 45 y.o.   MRN: [id number]  HPI ABDOMINAL PAINS  1) Location: midabdomen Onset: Febraury 2012  Radiation: none Severity: mild Quality: pressure or rolling sensation Progression: intermittent to now daily Better with: nothing Worse with: nothing Feels bloated.   2)   Location: RLQ  Onset: last week  Radiation: none Severity: moderate Quality: aching Duration: one week  Better with: nothing Worse with: nothing   Symptoms Nausea/Vomiting: no  Diarrhea: no  Constipation: no  Melena/BRBPR: no  Hematemesis: no  Anorexia: no  Fever/Chills: no  Dysuria: no  Rash: no  Wt loss: no, Weight gain of ~ 6 pounds over last month  EtOH use: no  NSAIDs/ASA: no  LMP: Last month, irregular menses Vaginal bleeding: no  STD risk/hx: no, pt is in monogamous relation with husband and she is fairly sure it is currently mutually monogamous   Past Surgeries: Colonoscopy  Dr Chales Abrahams,  Internal hemorrhoids(+). - 05/05/2000;  TUBAL LIGATION; Bladder "Stretched"  Pt has IBS listed on problem list from tranisition to [employer] in 2008.  She reports a history of Uterine adenomyosis. FH significant for her Father dying from Pancreatic Cancer at age 37.   Dyspnea Onset several months ago.  Began with an episode while in [place] that seem to last about an hour. It has progressed to several times a week. She is able to cope with the events by leaning forward and trying to relax.  The current episodes usually last less than 10 minutes.  There is no clear precipitant.  It can occur while not engaged with any stressful activity, like just watching television.  She denies any anxiety or stressors in her life.  Assocaited symptoms of nausea and dizziness and hot flasshes.  Her menses have become irregular, with the most recent mense before her May menses was in February.  She is having nightly hot flashes that interfere with sleep.  PMH  significant for using an Albuterol inhaler for possible Asthma back in late 90s to earyl 2000s.   CHRONIC HYPERTENSION  Disease Monitoring  Blood pressure range: 9 home BP readings with SBP 123 to 154 ( mean 138) and DBP 80 to 99 (Mean 88)  Chest pain: no   Dyspnea: yes, see above     Medication compliance: yes. Started Metoprolol 50 mg XL daily.  Headaches have resolved.  Medication Side Effects  Lightheadedness: no   Urinary frequency: no   Edema: no  Preventitive Healthcare:  Exercise: yes          Review of Systems  Respiratory: Positive for shortness of breath (Intermittent-brief (<10 minutes)).   Gastrointestinal: Positive for abdominal distention.  Genitourinary: Negative for dysuria, urgency, frequency, vaginal bleeding, vaginal discharge and vaginal pain.   See HPI     Objective:   Physical Exam  Constitutional: She appears well-developed and well-nourished. No distress.  Eyes: Conjunctivae and EOM are normal. Pupils are equal, round, and reactive to light.  Neck: No thyromegaly present.  Cardiovascular: Normal rate, regular rhythm, normal heart sounds and intact distal pulses.   No murmur heard. Pulmonary/Chest: Effort normal and breath sounds normal.  Abdominal: Soft. Normal appearance, normal aorta and bowel sounds are normal. She exhibits no distension, no pulsatile liver, no fluid wave, no abdominal bruit and no pulsatile midline mass. There is no hepatosplenomegaly. There is tenderness in the right lower quadrant. There is no rigidity, no rebound, no guarding, no CVA  tenderness and negative Murphy's sign. No hernia.  Lymphadenopathy:       Right: No inguinal adenopathy present.       Left: No inguinal adenopathy present.  Skin: Skin is warm, dry and intact. No rash noted.  Psychiatric: She has a normal mood and affect. Her speech is normal and behavior is normal. Cognition and memory are normal.          Assessment & Plan:

## 2010-09-07 NOTE — Assessment & Plan Note (Signed)
Onset in February 12. Intermittent, relatively brief episodes of self-resolving dyspnea with associated autononmic symptoms of nausea, dizziness and hot flashes.  While patient may have used an Abuterol inhaler in past, she does not recall hx of asthma.  She is in the Climacteric period.  I suspect a functional process like Panic Disorder.  Melanie Cole seemed resistant to the diagnosis of Panic Disorder but would read about it to see if it applies.  Will check TSH, CMET, CBC.  SSRI would be a good option for both a possible Panic Disorder and Vasomotor symptoms of climacteric.  She was ressitant to trial SSRI.

## 2010-09-07 NOTE — Assessment & Plan Note (Signed)
Adequate glycemic control. Tolerating medications.  No new end-organ damage.  Continue current medications.  

## 2010-09-07 NOTE — Progress Notes (Signed)
Labs done today Melanie Cole 

## 2010-09-07 NOTE — Telephone Encounter (Signed)
Called pt. Left message on pt's voice mail: APPT AT [facility] 09-14-10 AT 12 noon. ARRIVE AT 11:45 AM AT [facility]. OUTPATIENT ADMITTING. PICK UP CONTRAST FEW DAYS BEFORE YOUR APPOINTMENT. Waiting for pt to call back to confirm message .Arlyss Repress

## 2010-09-07 NOTE — Assessment & Plan Note (Addendum)
Patient is concerned for cancer as origin or her chronic periumbilical pain, especially because her father had pancreatic cancer at age 45. Pt has hisotry of IBS in her prbolem list.  She Denies change in stool conssistency or relief with defecation.  She had seen a GI doc, Dr Chales Abrahams, who performed an essentially unremarkable colonoscopy in 2002.  Will check CMET, CBC.  IgA antiglutaminase titer for celiac disease.  Suspect a benign function origin of pain.  Will check Abdominopelvic CT with Contrast to look for structural abnormality of chronic pariumbilical pain and more recent RLQ pain (About one weeks duration).

## 2010-09-07 NOTE — Telephone Encounter (Signed)
Called pt and lmvm: APPT CHANGED PER PT'S REQUEST. CT SCAN 09-09-10 AT 2:15PM. PICK UP CONTRAST DAY BEFORE APPT. Lorenda Hatchet, Renato Battles

## 2010-09-07 NOTE — Telephone Encounter (Signed)
Pt called back and I informed her of her appt. Lorenda Hatchet, Renato Battles

## 2010-09-08 ENCOUNTER — Encounter: Payer: Self-pay | Admitting: Family Medicine

## 2010-09-08 NOTE — Telephone Encounter (Signed)
Patient returned call and was notified of appointment.Busick, Rodena Medin

## 2010-09-08 NOTE — Telephone Encounter (Signed)
Left message for patient to return call.Busick, Rodena Medin  Prior authorization [phone]

## 2010-09-09 ENCOUNTER — Ambulatory Visit (HOSPITAL_COMMUNITY)
Admission: RE | Admit: 2010-09-09 | Discharge: 2010-09-09 | Disposition: A | Discharge status: Home / Self Care | Payer: 59 | Source: Ambulatory Visit | Attending: Family Medicine | Admitting: Family Medicine

## 2010-09-09 DIAGNOSIS — R109 Unspecified abdominal pain: Secondary | ICD-10-CM

## 2010-09-09 DIAGNOSIS — K7689 Other specified diseases of liver: Secondary | ICD-10-CM | POA: Insufficient documentation

## 2010-09-09 DIAGNOSIS — R1033 Periumbilical pain: Secondary | ICD-10-CM | POA: Insufficient documentation

## 2010-09-09 DIAGNOSIS — R1031 Right lower quadrant pain: Secondary | ICD-10-CM | POA: Insufficient documentation

## 2010-09-09 MED ORDER — IOHEXOL 300 MG/ML  SOLN
80.0000 mL | Freq: Once | INTRAMUSCULAR | Status: AC | PRN
  Administered 2010-09-09: 80 mL via INTRAVENOUS

## 2010-09-10 ENCOUNTER — Telehealth: Payer: Self-pay | Admitting: Family Medicine

## 2010-09-10 DIAGNOSIS — D1803 Hemangioma of intra-abdominal structures: Secondary | ICD-10-CM

## 2010-09-10 HISTORY — DX: Hemangioma of intra-abdominal structures: D18.03

## 2010-09-10 NOTE — Telephone Encounter (Signed)
Left message about normal abdominopelvic CT.

## 2010-09-13 ENCOUNTER — Telehealth: Payer: Self-pay | Admitting: Family Medicine

## 2010-09-13 NOTE — Telephone Encounter (Signed)
Has a few questions about results from last week  What is the size of the hermangioma?  Thinks her sx could be related

## 2010-09-13 NOTE — Telephone Encounter (Signed)
Spoke with patientt and told her the size of hemangioma, she still has further questions that she wants to only address with MD. Will forward to him.

## 2010-09-14 ENCOUNTER — Other Ambulatory Visit (HOSPITAL_COMMUNITY): Payer: 59

## 2010-12-27 LAB — I-STAT 8, (EC8 V) (CONVERTED LAB)
Acid-Base Excess: 1
Bicarbonate: 26.8 — ABNORMAL HIGH
Chloride: 106
TCO2: 28
pCO2, Ven: 45.3
pH, Ven: 7.379 — ABNORMAL HIGH

## 2010-12-27 LAB — URINALYSIS, ROUTINE W REFLEX MICROSCOPIC
Glucose, UA: NEGATIVE
Ketones, ur: NEGATIVE
pH: 6.5

## 2010-12-27 LAB — POCT I-STAT CREATININE
Creatinine, Ser: 0.8
Operator id: 294501

## 2011-01-26 ENCOUNTER — Other Ambulatory Visit: Payer: Self-pay | Admitting: Family Medicine

## 2011-01-26 DIAGNOSIS — N63 Unspecified lump in unspecified breast: Secondary | ICD-10-CM

## 2011-02-11 ENCOUNTER — Ambulatory Visit
Admission: RE | Admit: 2011-02-11 | Discharge: 2011-02-11 | Disposition: A | Discharge status: Home / Self Care | Payer: 59 | Source: Ambulatory Visit | Attending: Family Medicine | Admitting: Family Medicine

## 2011-02-11 DIAGNOSIS — N63 Unspecified lump in unspecified breast: Secondary | ICD-10-CM

## 2011-06-23 ENCOUNTER — Other Ambulatory Visit: Payer: Self-pay | Admitting: Family Medicine

## 2011-11-28 ENCOUNTER — Ambulatory Visit (INDEPENDENT_AMBULATORY_CARE_PROVIDER_SITE_OTHER): Payer: 59 | Admitting: Family Medicine

## 2011-11-28 ENCOUNTER — Encounter: Payer: Self-pay | Admitting: Family Medicine

## 2011-11-28 VITALS — BP 127/80 | HR 74 | Ht 64.0 in | Wt 162.0 lb

## 2011-11-28 DIAGNOSIS — I10 Essential (primary) hypertension: Secondary | ICD-10-CM

## 2011-11-28 DIAGNOSIS — E7800 Pure hypercholesterolemia, unspecified: Secondary | ICD-10-CM

## 2011-11-28 DIAGNOSIS — G43909 Migraine, unspecified, not intractable, without status migrainosus: Secondary | ICD-10-CM

## 2011-11-28 DIAGNOSIS — Z0000 Encounter for general adult medical examination without abnormal findings: Secondary | ICD-10-CM

## 2011-11-28 DIAGNOSIS — E559 Vitamin D deficiency, unspecified: Secondary | ICD-10-CM

## 2011-11-28 DIAGNOSIS — N912 Amenorrhea, unspecified: Secondary | ICD-10-CM

## 2011-11-28 DIAGNOSIS — N951 Menopausal and female climacteric states: Secondary | ICD-10-CM

## 2011-11-28 MED ORDER — HYDROCHLOROTHIAZIDE 25 MG PO TABS: 25.0000 mg | ORAL_TABLET | Freq: Every day | ORAL | Status: DC

## 2011-11-28 MED ORDER — MEDROXYPROGESTERONE ACETATE 10 MG PO TABS: 10.0000 mg | ORAL_TABLET | Freq: Every day | ORAL | Status: DC

## 2011-11-28 MED ORDER — AMLODIPINE BESYLATE 10 MG PO TABS
10.0000 mg | ORAL_TABLET | Freq: Every day | ORAL | Status: DC
Start: 2011-11-28 — End: 2012-12-17

## 2011-11-28 NOTE — Assessment & Plan Note (Signed)
The patient strongly desires Provera. Doubt she will have much effect from it, and should have a very few side effects.

## 2011-11-28 NOTE — Progress Notes (Addendum)
  Subjective:    Patient ID: Melanie Cole, female    DOB: [birth year], 46 y.o.   MRN: [id number]  HPI Patient comes in today for annual exam. She was last seen in the office in May of last year. Patient's history of hypertension and she had a flare last year where her blood pressure became much worse and she required additional Norvasc at 10. Metoprolol was added seen after at 50 mg the patient began to see feel very lethargic and fatigued and stopped both of these medications. 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 Approximately 2 weeks ago she developed significant headache and felt there was a band tightening around her head. She also developed chest pain radiating through to her back. She checked her blood pressures which were 175/113, 167/107, 150/98. She has resume her Norvasc and her blood pressures have come down dramatically. Additionally, the patient has had no symptoms of headache or chest pain since her blood pressure has returned to normal.  The patient also complained of hot flashes. Patient reports having a cycle in January and then spotting in June. Her last CPE an [facility] was checked which was 50. The patient desires additional Provera to see if that will make her have a cycle. She reports being too young to be in menopause.  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 She reports similar symptoms on the left breast medially at 3:00.  Review of Systems  Constitutional: Negative for fever and chills.  HENT: Negative for nosebleeds, congestion and rhinorrhea.   Eyes: Negative for visual disturbance.  Respiratory: Negative for chest tightness and shortness of breath.   Cardiovascular: Negative for chest pain.  Gastrointestinal: Negative for nausea, vomiting, abdominal pain, diarrhea, constipation, blood in stool and abdominal distention.  Genitourinary: Negative for dysuria, frequency,  vaginal bleeding and menstrual problem.  Musculoskeletal: Negative for arthralgias.  Skin: Negative for rash.  Neurological: Negative for dizziness and headaches.  Psychiatric/Behavioral: Negative for behavioral problems, disturbed wake/sleep cycle and dysphoric mood.  All other systems reviewed and are negative.       Objective:   Physical Exam  Vitals reviewed. Constitutional: She is oriented to person, place, and time. She appears well-developed and well-nourished. No distress.  HENT:  Head: Normocephalic and atraumatic.  Eyes: Pupils are equal, round, and reactive to light. No scleral icterus.  Neck: Normal range of motion. Neck supple. No thyromegaly present.  Cardiovascular: Normal rate and regular rhythm.   No murmur heard. Pulses:      Dorsalis pedis pulses are 2+ on the right side, and 2+ on the left side.  Pulmonary/Chest: Effort normal and breath sounds normal. She has no wheezes. Right breast exhibits skin change and tenderness. Right breast exhibits no inverted nipple, no mass and no nipple discharge. Left breast exhibits no inverted nipple, no mass, no nipple discharge, no skin change (cordlike tender mass felt medially.) and no tenderness.    Abdominal: Soft. She exhibits no distension and no mass. There is no tenderness.  Musculoskeletal: Normal range of motion. She exhibits no edema.  Neurological: She is alert and oriented to person, place, and time.  Skin: Skin is warm and dry. No rash noted.  Psychiatric: She has a normal mood and affect. Her behavior is normal. Thought content normal.          Assessment & Plan:  Schedule mammogram. Does not need Pap smear this year.

## 2011-11-28 NOTE — Assessment & Plan Note (Addendum)
Recent spike in blood pressure, unclear etiology. Continue calcium channel blocker. Return in 3 months for her blood pressure check.

## 2011-11-28 NOTE — Patient Instructions (Addendum)
Hypertension As your heart beats, it forces blood through your arteries. This force is your blood pressure. If the pressure is too high, it is called hypertension (HTN) or high blood pressure. HTN is dangerous because you may have it and not know it. High blood pressure may mean that your heart has to work harder to pump blood. Your arteries may be narrow or stiff. The extra work puts you at risk for heart disease, stroke, and other problems.  Blood pressure consists of two numbers, a higher number over a lower, 110/72, for example. It is stated as "110 over 72." The ideal is below 120 for the top number (systolic) and under 80 for the bottom (diastolic). Write down your blood pressure today. You should pay close attention to your blood pressure if you have certain conditions such as:  Heart failure.   Prior heart attack.   Diabetes   Chronic kidney disease.   Prior stroke.   Multiple risk factors for heart disease.  To see if you have HTN, your blood pressure should be measured while you are seated with your arm held at the level of the heart. It should be measured at least twice. A one-time elevated blood pressure reading (especially in the Emergency Department) does not mean that you need treatment. There may be conditions in which the blood pressure is different between your right and left arms. It is important to see your caregiver soon for a recheck. Most people have essential hypertension which means that there is not a specific cause. This type of high blood pressure may be lowered by changing lifestyle factors such as:  Stress.   Smoking.   Lack of exercise.   Excessive weight.   Drug/tobacco/alcohol use.   Eating less salt.  Most people do not have symptoms from high blood pressure until it has caused damage to the body. Effective treatment can often prevent, delay or reduce that damage. TREATMENT  When a cause has been identified, treatment for high blood pressure is  directed at the cause. There are a large number of medications to treat HTN. These fall into several categories, and your caregiver will help you select the medicines that are best for you. Medications may have side effects. You should review side effects with your caregiver. If your blood pressure stays high after you have made lifestyle changes or started on medicines,   Your medication(s) may need to be changed.   Other problems may need to be addressed.   Be certain you understand your prescriptions, and know how and when to take your medicine.   Be sure to follow up with your caregiver within the time frame advised (usually within two weeks) to have your blood pressure rechecked and to review your medications.   If you are taking more than one medicine to lower your blood pressure, make sure you know how and at what times they should be taken. Taking two medicines at the same time can result in blood pressure that is too low.  SEEK IMMEDIATE MEDICAL CARE IF:  You develop a severe headache, blurred or changing vision, or confusion.   You have unusual weakness or numbness, or a faint feeling.   You have severe chest or abdominal pain, vomiting, or breathing problems.  MAKE SURE YOU:   Understand these instructions.   Will watch your condition.   Will get help right away if you are not doing well or get worse.  Document Released: 03/21/2005 Document Revised: 03/10/2011 Document Reviewed:   11/09/2007 [employer] Patient Information 2012 [employer].Preventive Care for Adults, Female A healthy lifestyle and preventive care can promote health and wellness. Preventive health guidelines for women include the following key practices.  A routine yearly physical is a good way to check with your caregiver about your health and preventive screening. It is a chance to share any concerns and updates on your health, and to receive a thorough exam.   Visit your dentist for a routine exam and  preventive care every 6 months. Brush your teeth twice a day and floss once a day. Good oral hygiene prevents tooth decay and gum disease.   The frequency of eye exams is based on your age, health, family medical history, use of contact lenses, and other factors. Follow your caregiver's recommendations for frequency of eye exams.   Eat a healthy diet. Foods like vegetables, fruits, whole grains, low-fat dairy products, and lean protein foods contain the nutrients you need without too many calories. Decrease your intake of foods high in solid fats, added sugars, and salt. Eat the right amount of calories for you.Get information about a proper diet from your caregiver, if necessary.   Regular physical exercise is one of the most important things you can do for your health. Most adults should get at least 150 minutes of moderate-intensity exercise (any activity that increases your heart rate and causes you to sweat) each week. In addition, most adults need muscle-strengthening exercises on 2 or more days a week.   Maintain a healthy weight. The body mass index (BMI) is a screening tool to identify possible weight problems. It provides an estimate of body fat based on height and weight. Your caregiver can help determine your BMI, and can help you achieve or maintain a healthy weight.For adults 20 years and older:   A BMI below 18.5 is considered underweight.   A BMI of 18.5 to 24.9 is normal.   A BMI of 25 to 29.9 is considered overweight.   A BMI of 30 and above is considered obese.   Maintain normal blood lipids and cholesterol levels by exercising and minimizing your intake of saturated fat. Eat a balanced diet with plenty of fruit and vegetables. Blood tests for lipids and cholesterol should begin at age 20 and be repeated every 5 years. If your lipid or cholesterol levels are high, you are over 50, or you are at high risk for heart disease, you may need your cholesterol levels checked more  frequently.Ongoing high lipid and cholesterol levels should be treated with medicines if diet and exercise are not effective.   If you smoke, find out from your caregiver how to quit. If you do not use tobacco, do not start.   If you are pregnant, do not drink alcohol. If you are breastfeeding, be very cautious about drinking alcohol. If you are not pregnant and choose to drink alcohol, do not exceed 1 drink per day. One drink is considered to be 12 ounces (355 mL) of beer, 5 ounces (148 mL) of wine, or 1.5 ounces (44 mL) of liquor.   Avoid use of street drugs. Do not share needles with anyone. Ask for help if you need support or instructions about stopping the use of drugs.   High blood pressure causes heart disease and increases the risk of stroke. Your blood pressure should be checked at least every 1 to 2 years. Ongoing high blood pressure should be treated with medicines if weight loss and exercise are not effective.     If you are 55 to 46 years old, ask your caregiver if you should take aspirin to prevent strokes.   Diabetes screening involves taking a blood sample to check your fasting blood sugar level. This should be done once every 3 years, after age 45, if you are within normal weight and without risk factors for diabetes. Testing should be considered at a younger age or be carried out more frequently if you are overweight and have at least 1 risk factor for diabetes.   Breast cancer screening is essential preventive care for women. You should practice "breast self-awareness." This means understanding the normal appearance and feel of your breasts and may include breast self-examination. Any changes detected, no matter how small, should be reported to a caregiver. Women in their 20s and 30s should have a clinical breast exam (CBE) by a caregiver as part of a regular health exam every 1 to 3 years. After age 40, women should have a CBE every year. Starting at age 40, women should consider  having a mammography (breast X-ray test) every year. Women who have a family history of breast cancer should talk to their caregiver about genetic screening. Women at a high risk of breast cancer should talk to their caregivers about having magnetic resonance imaging (MRI) and a mammography every year.   The Pap test is a screening test for cervical cancer. A Pap test can show cell changes on the cervix that might become cervical cancer if left untreated. A Pap test is a procedure in which cells are obtained and examined from the lower end of the uterus (cervix).   Women should have a Pap test starting at age 21.   Between ages 21 and 29, Pap tests should be repeated every 2 years.   Beginning at age 30, you should have a Pap test every 3 years as long as the past 3 Pap tests have been normal.   Some women have medical problems that increase the chance of getting cervical cancer. Talk to your caregiver about these problems. It is especially important to talk to your caregiver if a new problem develops soon after your last Pap test. In these cases, your caregiver may recommend more frequent screening and Pap tests.   The above recommendations are the same for women who have or have not gotten the vaccine for human papillomavirus (HPV).   If you had a hysterectomy for a problem that was not cancer or a condition that could lead to cancer, then you no longer need Pap tests. Even if you no longer need a Pap test, a regular exam is a good idea to make sure no other problems are starting.   If you are between ages 65 and 70, and you have had normal Pap tests going back 10 years, you no longer need Pap tests. Even if you no longer need a Pap test, a regular exam is a good idea to make sure no other problems are starting.   If you have had past treatment for cervical cancer or a condition that could lead to cancer, you need Pap tests and screening for cancer for at least 20 years after your treatment.    If Pap tests have been discontinued, risk factors (such as a new sexual partner) need to be reassessed to determine if screening should be resumed.   The HPV test is an additional test that may be used for cervical cancer screening. The HPV test looks for the virus that can cause   the cell changes on the cervix. The cells collected during the Pap test can be tested for HPV. The HPV test could be used to screen women aged 30 years and older, and should be used in women of any age who have unclear Pap test results. After the age of 30, women should have HPV testing at the same frequency as a Pap test.   Colorectal cancer can be detected and often prevented. Most routine colorectal cancer screening begins at the age of 50 and continues through age 75. However, your caregiver may recommend screening at an earlier age if you have risk factors for colon cancer. On a yearly basis, your caregiver may provide home test kits to check for hidden blood in the stool. Use of a small camera at the end of a tube, to directly examine the colon (sigmoidoscopy or colonoscopy), can detect the earliest forms of colorectal cancer. Talk to your caregiver about this at age 50, when routine screening begins. Direct examination of the colon should be repeated every 5 to 10 years through age 75, unless early forms of pre-cancerous polyps or small growths are found.   Hepatitis C blood testing is recommended for all people born from 1945 through 1965 and any individual with known risks for hepatitis C.   Practice safe sex. Use condoms and avoid high-risk sexual practices to reduce the spread of sexually transmitted infections (STIs). STIs include gonorrhea, chlamydia, syphilis, trichomonas, herpes, HPV, and human immunodeficiency virus (HIV). Herpes, HIV, and HPV are viral illnesses that have no cure. They can result in disability, cancer, and death. Sexually active women aged 25 and younger should be checked for chlamydia. Older  women with new or multiple partners should also be tested for chlamydia. Testing for other STIs is recommended if you are sexually active and at increased risk.   Osteoporosis is a disease in which the bones lose minerals and strength with aging. This can result in serious bone fractures. The risk of osteoporosis can be identified using a bone density scan. Women ages 65 and over and women at risk for fractures or osteoporosis should discuss screening with their caregivers. Ask your caregiver whether you should take a calcium supplement or vitamin D to reduce the rate of osteoporosis.   Menopause can be associated with physical symptoms and risks. Hormone replacement therapy is available to decrease symptoms and risks. You should talk to your caregiver about whether hormone replacement therapy is right for you.   Use sunscreen with sun protection factor (SPF) of 30 or more. Apply sunscreen liberally and repeatedly throughout the day. You should seek shade when your shadow is shorter than you. Protect yourself by wearing long sleeves, pants, a wide-brimmed hat, and sunglasses year round, whenever you are outdoors.   Once a month, do a whole body skin exam, using a mirror to look at the skin on your back. Notify your caregiver of new moles, moles that have irregular borders, moles that are larger than a pencil eraser, or moles that have changed in shape or color.   Stay current with required immunizations.   Influenza. You need a dose every fall (or winter). The composition of the flu vaccine changes each year, so being vaccinated once is not enough.   Pneumococcal polysaccharide. You need 1 to 2 doses if you smoke cigarettes or if you have certain chronic medical conditions. You need 1 dose at age 65 (or older) if you have never been vaccinated.   Tetanus, diphtheria, pertussis (Tdap,   Td). Get 1 dose of Tdap vaccine if you are younger than age 65, are over 65 and have contact with an infant, are a  healthcare worker, are pregnant, or simply want to be protected from whooping cough. After that, you need a Td booster dose every 10 years. Consult your caregiver if you have not had at least 3 tetanus and diphtheria-containing shots sometime in your life or have a deep or dirty wound.   HPV. You need this vaccine if you are a woman age 26 or younger. The vaccine is given in 3 doses over 6 months.   Measles, mumps, rubella (MMR). You need at least 1 dose of MMR if you were born in 1957 or later. You may also need a second dose.   Meningococcal. If you are age 19 to 21 and a first-year college student living in a residence hall, or have one of several medical conditions, you need to get vaccinated against meningococcal disease. You may also need additional booster doses.   Zoster (shingles). If you are age 60 or older, you should get this vaccine.   Varicella (chickenpox). If you have never had chickenpox or you were vaccinated but received only 1 dose, talk to your caregiver to find out if you need this vaccine.   Hepatitis A. You need this vaccine if you have a specific risk factor for hepatitis A virus infection or you simply wish to be protected from this disease. The vaccine is usually given as 2 doses, 6 to 18 months apart.   Hepatitis B. You need this vaccine if you have a specific risk factor for hepatitis B virus infection or you simply wish to be protected from this disease. The vaccine is given in 3 doses, usually over 6 months.  Preventive Services / Frequency Ages 19 to 39  Blood pressure check.** / Every 1 to 2 years.   Lipid and cholesterol check.** / Every 5 years beginning at age 20.   Clinical breast exam.** / Every 3 years for women in their 20s and 30s.   Pap test.** / Every 2 years from ages 21 through 29. Every 3 years starting at age 30 through age 65 or 70 with a history of 3 consecutive normal Pap tests.   HPV screening.** / Every 3 years from ages 30 through ages 65  to 70 with a history of 3 consecutive normal Pap tests.   Hepatitis C blood test.** / For any individual with known risks for hepatitis C.   Skin self-exam. / Monthly.   Influenza immunization.** / Every year.   Pneumococcal polysaccharide immunization.** / 1 to 2 doses if you smoke cigarettes or if you have certain chronic medical conditions.   Tetanus, diphtheria, pertussis (Tdap, Td) immunization. / A one-time dose of Tdap vaccine. After that, you need a Td booster dose every 10 years.   HPV immunization. / 3 doses over 6 months, if you are 26 and younger.   Measles, mumps, rubella (MMR) immunization. / You need at least 1 dose of MMR if you were born in 1957 or later. You may also need a second dose.   Meningococcal immunization. / 1 dose if you are age 19 to 21 and a first-year college student living in a residence hall, or have one of several medical conditions, you need to get vaccinated against meningococcal disease. You may also need additional booster doses.   Varicella immunization.** / Consult your caregiver.   Hepatitis A immunization.** / Consult your caregiver.   2 doses, 6 to 18 months apart.   Hepatitis B immunization.** / Consult your caregiver. 3 doses usually over 6 months.  Ages 40 to 64  Blood pressure check.** / Every 1 to 2 years.   Lipid and cholesterol check.** / Every 5 years beginning at age 20.   Clinical breast exam.** / Every year after age 40.   Mammogram.** / Every year beginning at age 40 and continuing for as long as you are in good health. Consult with your caregiver.   Pap test.** / Every 3 years starting at age 30 through age 65 or 70 with a history of 3 consecutive normal Pap tests.   HPV screening.** / Every 3 years from ages 30 through ages 65 to 70 with a history of 3 consecutive normal Pap tests.   Fecal occult blood test (FOBT) of stool. / Every year beginning at age 50 and continuing until age 75. You may not need to do this test if you  get a colonoscopy every 10 years.   Flexible sigmoidoscopy or colonoscopy.** / Every 5 years for a flexible sigmoidoscopy or every 10 years for a colonoscopy beginning at age 50 and continuing until age 75.   Hepatitis C blood test.** / For all people born from 1945 through 1965 and any individual with known risks for hepatitis C.   Skin self-exam. / Monthly.   Influenza immunization.** / Every year.   Pneumococcal polysaccharide immunization.** / 1 to 2 doses if you smoke cigarettes or if you have certain chronic medical conditions.   Tetanus, diphtheria, pertussis (Tdap, Td) immunization.** / A one-time dose of Tdap vaccine. After that, you need a Td booster dose every 10 years.   Measles, mumps, rubella (MMR) immunization. / You need at least 1 dose of MMR if you were born in 1957 or later. You may also need a second dose.   Varicella immunization.** / Consult your caregiver.   Meningococcal immunization.** / Consult your caregiver.   Hepatitis A immunization.** / Consult your caregiver. 2 doses, 6 to 18 months apart.   Hepatitis B immunization.** / Consult your caregiver. 3 doses, usually over 6 months.  Ages 65 and over  Blood pressure check.** / Every 1 to 2 years.   Lipid and cholesterol check.** / Every 5 years beginning at age 20.   Clinical breast exam.** / Every year after age 40.   Mammogram.** / Every year beginning at age 40 and continuing for as long as you are in good health. Consult with your caregiver.   Pap test.** / Every 3 years starting at age 30 through age 65 or 70 with a 3 consecutive normal Pap tests. Testing can be stopped between 65 and 70 with 3 consecutive normal Pap tests and no abnormal Pap or HPV tests in the past 10 years.   HPV screening.** / Every 3 years from ages 30 through ages 65 or 70 with a history of 3 consecutive normal Pap tests. Testing can be stopped between 65 and 70 with 3 consecutive normal Pap tests and no abnormal Pap or HPV tests  in the past 10 years.   Fecal occult blood test (FOBT) of stool. / Every year beginning at age 50 and continuing until age 75. You may not need to do this test if you get a colonoscopy every 10 years.   Flexible sigmoidoscopy or colonoscopy.** / Every 5 years for a flexible sigmoidoscopy or every 10 years for a colonoscopy beginning at age 50 and continuing   until age 75.   Hepatitis C blood test.** / For all people born from 1945 through 1965 and any individual with known risks for hepatitis C.   Osteoporosis screening.** / A one-time screening for women ages 65 and over and women at risk for fractures or osteoporosis.   Skin self-exam. / Monthly.   Influenza immunization.** / Every year.   Pneumococcal polysaccharide immunization.** / 1 dose at age 65 (or older) if you have never been vaccinated.   Tetanus, diphtheria, pertussis (Tdap, Td) immunization. / A one-time dose of Tdap vaccine if you are over 65 and have contact with an infant, are a healthcare worker, or simply want to be protected from whooping cough. After that, you need a Td booster dose every 10 years.   Varicella immunization.** / Consult your caregiver.   Meningococcal immunization.** / Consult your caregiver.   Hepatitis A immunization.** / Consult your caregiver. 2 doses, 6 to 18 months apart.   Hepatitis B immunization.** / Check with your caregiver. 3 doses, usually over 6 months.  ** Family history and personal history of risk and conditions may change your caregiver's recommendations. Document Released: 05/17/2001 Document Revised: 03/10/2011 Document Reviewed: 08/16/2010 [employer] Patient Information 2012 [employer]. 

## 2012-01-03 ENCOUNTER — Other Ambulatory Visit: Payer: Self-pay | Admitting: Radiation Oncology

## 2012-01-03 ENCOUNTER — Other Ambulatory Visit: Payer: Self-pay | Admitting: Family Medicine

## 2012-01-03 DIAGNOSIS — Z1231 Encounter for screening mammogram for malignant neoplasm of breast: Secondary | ICD-10-CM

## 2012-02-13 ENCOUNTER — Ambulatory Visit
Admission: RE | Admit: 2012-02-13 | Discharge: 2012-02-13 | Disposition: A | Discharge status: Home / Self Care | Payer: 59 | Source: Ambulatory Visit | Attending: Family Medicine | Admitting: Family Medicine

## 2012-02-13 DIAGNOSIS — Z1231 Encounter for screening mammogram for malignant neoplasm of breast: Secondary | ICD-10-CM

## 2012-03-05 ENCOUNTER — Ambulatory Visit (INDEPENDENT_AMBULATORY_CARE_PROVIDER_SITE_OTHER): Payer: 59 | Admitting: Family Medicine

## 2012-03-05 ENCOUNTER — Other Ambulatory Visit (HOSPITAL_COMMUNITY)
Admission: RE | Admit: 2012-03-05 | Discharge: 2012-03-05 | Disposition: A | Discharge status: Home / Self Care | Payer: 59 | Source: Ambulatory Visit | Attending: Family Medicine | Admitting: Family Medicine

## 2012-03-05 ENCOUNTER — Encounter: Payer: Self-pay | Admitting: Family Medicine

## 2012-03-05 VITALS — BP 128/82 | HR 73 | Temp 98.2°F | Ht 64.0 in | Wt 170.0 lb

## 2012-03-05 DIAGNOSIS — F329 Major depressive disorder, single episode, unspecified: Secondary | ICD-10-CM

## 2012-03-05 DIAGNOSIS — R5381 Other malaise: Secondary | ICD-10-CM

## 2012-03-05 DIAGNOSIS — Z113 Encounter for screening for infections with a predominantly sexual mode of transmission: Secondary | ICD-10-CM | POA: Insufficient documentation

## 2012-03-05 DIAGNOSIS — Z23 Encounter for immunization: Secondary | ICD-10-CM

## 2012-03-05 DIAGNOSIS — F341 Dysthymic disorder: Secondary | ICD-10-CM

## 2012-03-05 DIAGNOSIS — N898 Other specified noninflammatory disorders of vagina: Secondary | ICD-10-CM

## 2012-03-05 DIAGNOSIS — R5383 Other fatigue: Secondary | ICD-10-CM

## 2012-03-05 DIAGNOSIS — E559 Vitamin D deficiency, unspecified: Secondary | ICD-10-CM

## 2012-03-05 DIAGNOSIS — M791 Myalgia, unspecified site: Secondary | ICD-10-CM

## 2012-03-05 DIAGNOSIS — Z01419 Encounter for gynecological examination (general) (routine) without abnormal findings: Secondary | ICD-10-CM | POA: Insufficient documentation

## 2012-03-05 DIAGNOSIS — IMO0001 Reserved for inherently not codable concepts without codable children: Secondary | ICD-10-CM

## 2012-03-05 DIAGNOSIS — Z124 Encounter for screening for malignant neoplasm of cervix: Secondary | ICD-10-CM

## 2012-03-05 DIAGNOSIS — Z2089 Contact with and (suspected) exposure to other communicable diseases: Secondary | ICD-10-CM

## 2012-03-05 DIAGNOSIS — Z202 Contact with and (suspected) exposure to infections with a predominantly sexual mode of transmission: Secondary | ICD-10-CM

## 2012-03-05 HISTORY — DX: Major depressive disorder, single episode, unspecified: F32.9

## 2012-03-05 MED ORDER — BUPROPION HCL ER (XL) 150 MG PO TB24: 150.0000 mg | ORAL_TABLET | Freq: Every day | ORAL | Status: DC

## 2012-03-05 NOTE — Patient Instructions (Addendum)
Depression, Adult Depression refers to feeling sad, low, down in the dumps, blue, gloomy, or empty. In general, there are two kinds of depression: 1. Depression that we all experience from time to time because of upsetting life experiences, including the loss of a job or the ending of a relationship (normal sadness or normal grief). This kind of depression is considered normal, is short lived, and resolves within a few days to 2 weeks. (Depression experienced after the loss of a loved one is called bereavement. Bereavement often lasts longer than 2 weeks but normally gets better with time.) 2. Clinical depression, which lasts longer than normal sadness or normal grief or interferes with your ability to function at home, at work, and in school. It also interferes with your personal relationships. It affects almost every aspect of your life. Clinical depression is an illness. Symptoms of depression also can be caused by conditions other than normal sadness and grief or clinical depression. Examples of these conditions are listed as follows:  Physical illness Some physical illnesses, including underactive thyroid gland (hypothyroidism), severe anemia, specific types of cancer, diabetes, uncontrolled seizures, heart and lung problems, strokes, and chronic pain are commonly associated with symptoms of depression.  Side effects of some prescription medicine In some people, certain types of prescription medicine can cause symptoms of depression.  Substance abuse Abuse of alcohol and illicit drugs can cause symptoms of depression. SYMPTOMS Symptoms of normal sadness and normal grief include the following:  Feeling sad or crying for short periods of time.  Not caring about anything (apathy).  Difficulty sleeping or sleeping too much.  No longer able to enjoy the things you used to enjoy.  Desire to be by oneself all the time (social isolation).  Lack of energy or motivation.  Difficulty  concentrating or remembering.  Change in appetite or weight.  Restlessness or agitation. Symptoms of clinical depression include the same symptoms of normal sadness or normal grief and also the following symptoms:  Feeling sad or crying all the time.  Feelings of guilt or worthlessness.  Feelings of hopelessness or helplessness.  Thoughts of suicide or the desire to harm yourself (suicidal ideation).  Loss of touch with reality (psychotic symptoms). Seeing or hearing things that are not real (hallucinations) or having false beliefs about your life or the people around you (delusions and paranoia). DIAGNOSIS  The diagnosis of clinical depression usually is based on the severity and duration of the symptoms. Your caregiver also will ask you questions about your medical history and substance use to find out if physical illness, use of prescription medicine, or substance abuse is causing your depression. Your caregiver also may order blood tests. TREATMENT  Typically, normal sadness and normal grief do not require treatment. However, sometimes antidepressant medicine is prescribed for bereavement to ease the depressive symptoms until they resolve. The treatment for clinical depression depends on the severity of your symptoms but typically includes antidepressant medicine, counseling with a mental health professional, or a combination of both. Your caregiver will help to determine what treatment is best for you. Depression caused by physical illness usually goes away with appropriate medical treatment of the illness. If prescription medicine is causing depression, talk with your caregiver about stopping the medicine, decreasing the dose, or substituting another medicine. Depression caused by abuse of alcohol or illicit drugs abuse goes away with abstinence from these substances. Some adults need professional help in order to stop drinking or using drugs. SEEK IMMEDIATE CARE IF:  You have   thoughts  about hurting yourself or others.  You lose touch with reality (have psychotic symptoms).  You are taking medicine for depression and have a serious side effect. FOR MORE INFORMATION National Alliance on Mental Illness: www.nami.[employer]: http://www.maynard.net/ Document Released: 03/18/2000 Document Revised: 09/20/2011 Document Reviewed: 06/20/2011 [facility] Patient Information 2013 [place], [place]. Vitamin D Deficiency Vitamin D is an important vitamin that your body needs. Having too little of it in your body is called a deficiency. A very bad deficiency can make your bones soft and can cause a condition called rickets.  Vitamin D is important to your body for different reasons, such as:   It helps your body absorb 2 minerals called calcium and phosphorus.  It helps make your bones healthy.  It may prevent some diseases, such as diabetes and multiple sclerosis.  It helps your muscles and heart. You can get vitamin D in several ways. It is a natural part of some foods. The vitamin is also added to some dairy products and cereals. Some people take vitamin D supplements. Also, your body makes vitamin D when you are in the sun. It changes the sun's rays into a form of the vitamin that your body can use. CAUSES   Not eating enough foods that contain vitamin D.  Not getting enough sunlight.  Having certain digestive system diseases that make it hard to absorb vitamin D. These diseases include Crohn's disease, chronic pancreatitis, and cystic fibrosis.  Having a surgery in which part of the stomach or small intestine is removed.  Being obese. Fat cells pull vitamin D out of your blood. That means that obese people may not have enough vitamin D left in their blood and in other body tissues.  Having chronic kidney or liver disease. RISK FACTORS Risk factors are things that make you more likely to develop a vitamin D deficiency. They include:  Being  older.  Not being able to get outside very much.  Living in a nursing home.  Having had broken bones.  Having weak or thin bones (osteoporosis).  Having a disease or condition that changes how your body absorbs vitamin D.  Having dark skin.  Some medicines such as seizure medicines or steroids.  Being overweight or obese. SYMPTOMS Mild cases of vitamin D deficiency may not have any symptoms. If you have a very bad case, symptoms may include:  Bone pain.  Muscle pain.  Falling often.  Broken bones caused by a minor injury, due to osteoporosis. DIAGNOSIS A blood test is the best way to tell if you have a vitamin D deficiency. TREATMENT Vitamin D deficiency can be treated in different ways. Treatment for vitamin D deficiency depends on what is causing it. Options include:  Taking vitamin D supplements.  Taking a calcium supplement. Your caregiver will suggest what dose is best for you. HOME CARE INSTRUCTIONS  Take any supplements that your caregiver prescribes. Follow the directions carefully. Take only the suggested amount.  Have your blood tested 2 months after you start taking supplements.  Eat foods that contain vitamin D. Healthy choices include:  Fortified dairy products, cereals, or juices. Fortified means vitamin D has been added to the food. Check the label on the package to be sure.  Fatty fish like salmon or trout.  Eggs.  Oysters.  Spend some time in the sun. Most people should get out in the sun without sunblock for 10 to 15 minutes at a time. Do this 3 times a  week. People who have had skin cancer should not do this. Ask your caregiver how long you should be in the sun. Do not use a tanning bed.  Keep your weight at a healthy level. Lose weight if you need to.  Keep all follow-up appointments. Your caregiver will need to perform blood tests to make sure your vitamin D deficiency is going away. SEEK MEDICAL CARE IF:  You have any questions about  your treatment.  You continue to have symptoms of vitamin D deficiency.  You have nausea or vomiting.  You are constipated.  You feel confused.  You have severe abdominal or back pain. MAKE SURE YOU:  Understand these instructions.  Will watch your condition.  Will get help right away if you are not doing well or get worse. Document Released: 06/13/2011 Document Reviewed: 06/13/2011 [facility] Patient Information 2013 [place], [place]. Fatigue Fatigue is a feeling of tiredness, lack of energy, lack of motivation, or feeling tired all the time. Having enough rest, good nutrition, and reducing stress will normally reduce fatigue. Consult your caregiver if it persists. The nature of your fatigue will help your caregiver to find out its cause. The treatment is based on the cause.  CAUSES  There are many causes for fatigue. Most of the time, fatigue can be traced to one or more of your habits or routines. Most causes fit into one or more of three general areas. They are: Lifestyle problems  Sleep disturbances.  Overwork.  Physical exertion.  Unhealthy habits.  Poor eating habits or eating disorders.  Alcohol and/or drug use .  Lack of proper nutrition (malnutrition). Psychological problems  Stress and/or anxiety problems.  Depression.  Grief.  Boredom. Medical Problems or Conditions  Anemia.  Pregnancy.  Thyroid gland problems.  Recovery from major surgery.  Continuous pain.  Emphysema or asthma that is not well controlled  Allergic conditions.  Diabetes.  Infections (such as mononucleosis).  Obesity.  Sleep disorders, such as sleep apnea.  Heart failure or other heart-related problems.  Cancer.  Kidney disease.  Liver disease.  Effects of certain medicines such as antihistamines, cough and cold remedies, prescription pain medicines, heart and blood pressure medicines, drugs used for treatment of cancer, and some antidepressants. SYMPTOMS  The  symptoms of fatigue include:   Lack of energy.  Lack of drive (motivation).  Drowsiness.  Feeling of indifference to the surroundings. DIAGNOSIS  The details of how you feel help guide your caregiver in finding out what is causing the fatigue. You will be asked about your present and past health condition. It is important to review all medicines that you take, including prescription and non-prescription items. A thorough exam will be done. You will be questioned about your feelings, habits, and normal lifestyle. Your caregiver may suggest blood tests, urine tests, or other tests to look for common medical causes of fatigue.  TREATMENT  Fatigue is treated by correcting the underlying cause. For example, if you have continuous pain or depression, treating these causes will improve how you feel. Similarly, adjusting the dose of certain medicines will help in reducing fatigue.  HOME CARE INSTRUCTIONS   Try to get the required amount of good sleep every night.  Eat a healthy and nutritious diet, and drink enough water throughout the day.  Practice ways of relaxing (including yoga or meditation).  Exercise regularly.  Make plans to change situations that cause stress. Act on those plans so that stresses decrease over time. Keep your work and personal routine  reasonable.  Avoid street drugs and minimize use of alcohol.  Start taking a daily multivitamin after consulting your caregiver. SEEK MEDICAL CARE IF:   You have persistent tiredness, which cannot be accounted for.  You have fever.  You have unintentional weight loss.  You have headaches.  You have disturbed sleep throughout the night.  You are feeling sad.  You have constipation.  You have dry skin.  You have gained weight.  You are taking any new or different medicines that you suspect are causing fatigue.  You are unable to sleep at night.  You develop any unusual swelling of your legs or other parts of your  body. SEEK IMMEDIATE MEDICAL CARE IF:   You are feeling confused.  Your vision is blurred.  You feel faint or pass out.  You develop severe headache.  You develop severe abdominal, pelvic, or back pain.  You develop chest pain, shortness of breath, or an irregular or fast heartbeat.  You are unable to pass a normal amount of urine.  You develop abnormal bleeding such as bleeding from the rectum or you vomit blood.  You have thoughts about harming yourself or committing suicide.  You are worried that you might harm someone else. MAKE SURE YOU:   Understand these instructions.  Will watch your condition.  Will get help right away if you are not doing well or get worse. Document Released: 01/16/2007 Document Revised: 06/13/2011 Document Reviewed: 01/16/2007 [facility] Patient Information 2013 [place], [place].

## 2012-03-05 NOTE — Assessment & Plan Note (Signed)
Check TSH and Vit D ?

## 2012-03-05 NOTE — Assessment & Plan Note (Signed)
Check level 

## 2012-03-05 NOTE — Progress Notes (Signed)
  Subjective:    Patient ID: [name], female    DOB: Jun 17, 1965, 46 y.o.   MRN: [id number]  HPI  Here for multiple indications today. 1.  F/u BP ok, 2.  F/u Mondor's and nml mammogram several months ago. 3.  Possible STD exposure by husband with long-term affair, desires full w/u and now with some depression, feels tearful, tired, over-eating, cautious.  In therapy. 4.  Extreme fatigue, debilitating. 5.  Needs Vit d level. 6.  Arthralgia and Myalgias- new exercise routine, with knee, elbow, calf pain. 7.  Continued hot flashes.  Tried Provera without success.  No cycle x 6 months.  Review of Systems  Constitutional:       See HPI  All other systems reviewed and are negative.       Objective:   Physical Exam  Vitals reviewed. Constitutional: She appears well-developed and well-nourished.  HENT:  Head: Normocephalic and atraumatic.  Eyes: No scleral icterus.  Neck: Neck supple.  Cardiovascular: Normal rate and regular rhythm.   No murmur heard. Pulmonary/Chest: Effort normal and breath sounds normal.  Abdominal: Soft. There is no tenderness.  Genitourinary: Vagina normal. Uterus is deviated (retroverted). Cervix exhibits no motion tenderness. Right adnexum displays no mass and no tenderness. Left adnexum displays no mass and no tenderness.          Assessment & Plan:

## 2012-03-05 NOTE — Assessment & Plan Note (Signed)
Full STD testing.

## 2012-03-05 NOTE — Assessment & Plan Note (Signed)
Joint pain and myalgias, r/o autoimmune process.

## 2012-03-06 LAB — HEPATITIS B SURFACE ANTIGEN: Hepatitis B Surface Ag: NEGATIVE

## 2012-03-06 LAB — RPR

## 2012-03-06 LAB — RHEUMATOID FACTOR: Rhuematoid fact SerPl-aCnc: <10 IU/mL (ref <=14)

## 2012-03-07 ENCOUNTER — Encounter: Payer: Self-pay | Admitting: *Deleted

## 2012-03-08 ENCOUNTER — Encounter: Payer: Self-pay | Admitting: *Deleted

## 2012-03-09 ENCOUNTER — Encounter: Payer: Self-pay | Admitting: Family Medicine

## 2012-03-12 ENCOUNTER — Telehealth: Payer: Self-pay | Admitting: Family Medicine

## 2012-03-12 NOTE — Telephone Encounter (Signed)
Copy of labs mailed to pt.

## 2012-03-12 NOTE — Telephone Encounter (Signed)
Patient is calling because she was in last Monday and had some labs done.  She got the letter with her results, but she is used to getting a detailed, actual copy of the lab results.  She would like that sent to her in the mail.

## 2012-03-29 IMAGING — CT CT ABD-PELV W/ CM
2 of 5 series · 14 of 32 positions shown, 19 images · IV contrast (water/omni  & 80ml omni 300)
Comparison: None.

CLINICAL DATA: Peri umbilical and right lower quadrant pain

CT ABDOMEN AND PELVIS WITH CONTRAST
TECHNIQUE: Multidetector CT imaging of the abdomen and pelvis was
performed following the standard protocol during bolus
administration of intravenous contrast.
Contrast: 80 ml Omni 300

[Series 2: routine abdomen · axial · 0.70mm/px · z∈[-394,-89]mm · 7 of 83 slices shown, 12 images]
[im 11/83  soft-tissue]
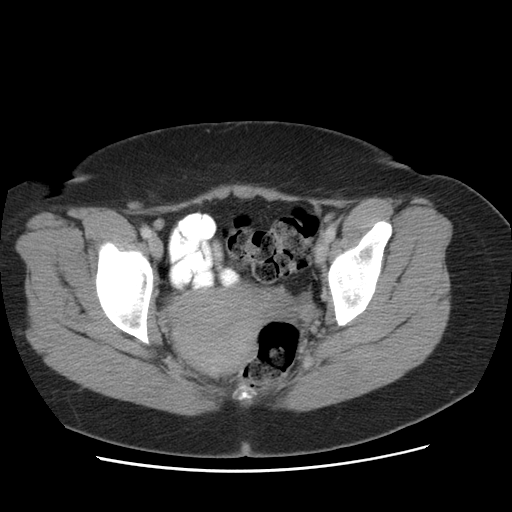
[im 11/83  bone]
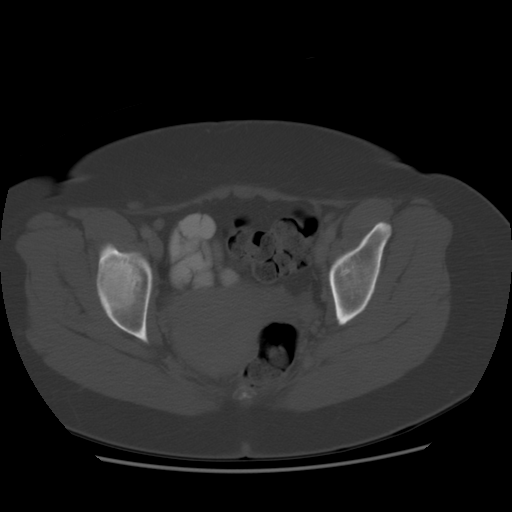
[im 21/83  soft-tissue]
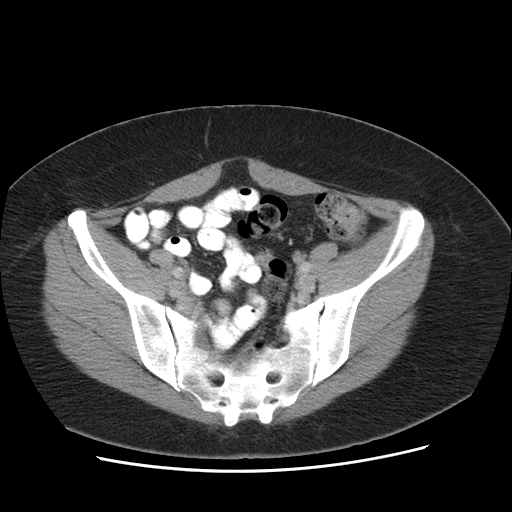
[im 31/83  soft-tissue]
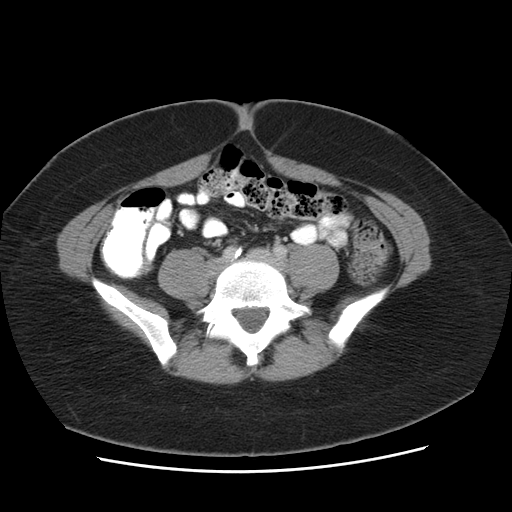
[im 42/83  soft-tissue]
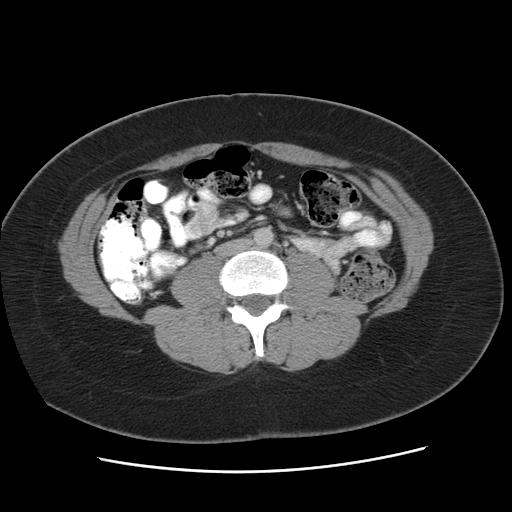
[im 42/83  lung]
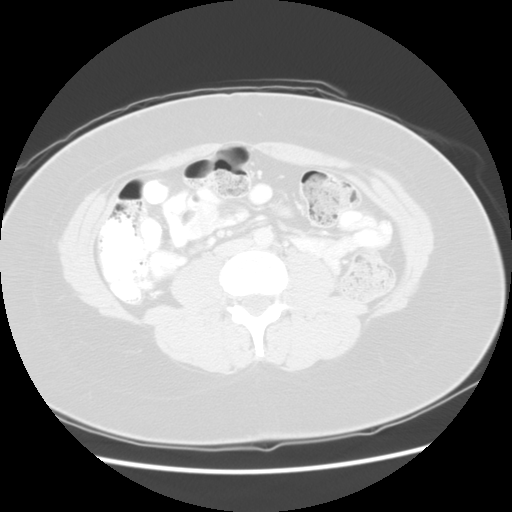
[im 52/83  soft-tissue]
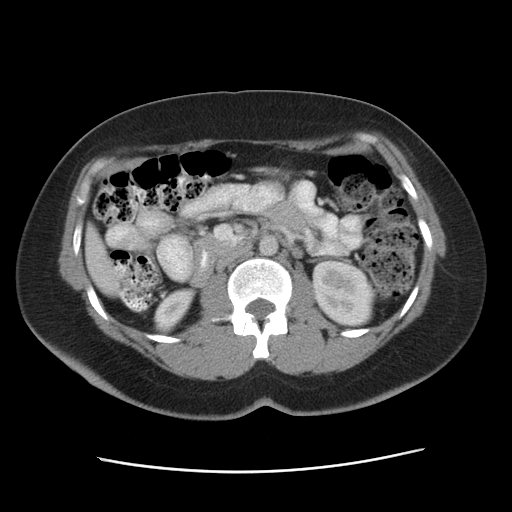
[im 52/83  lung]
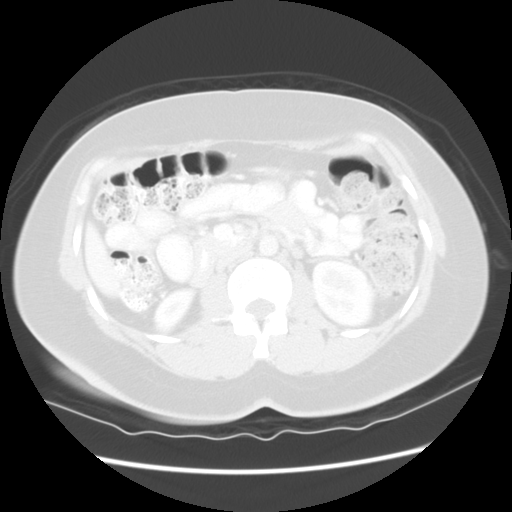
[im 62/83  soft-tissue]
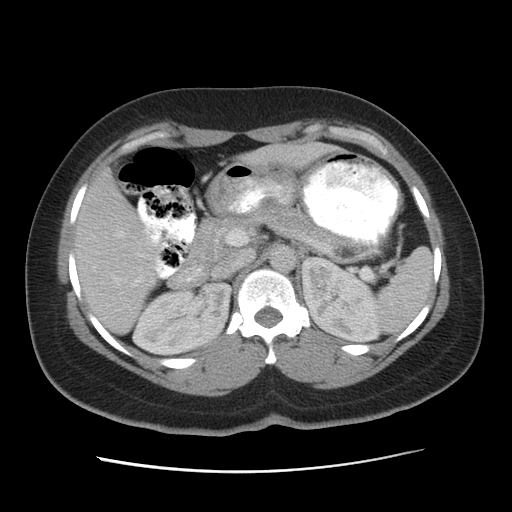
[im 62/83  lung]
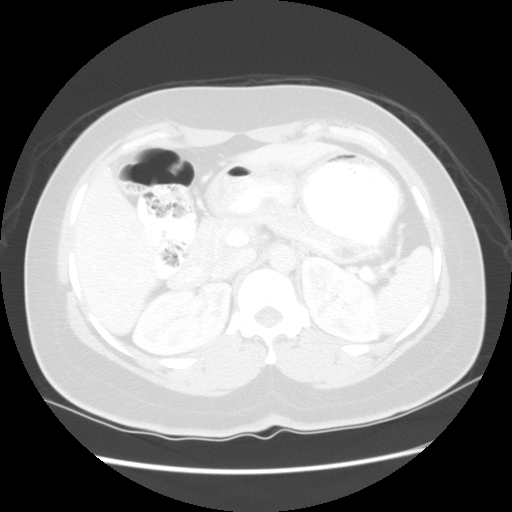
[im 72/83  soft-tissue]
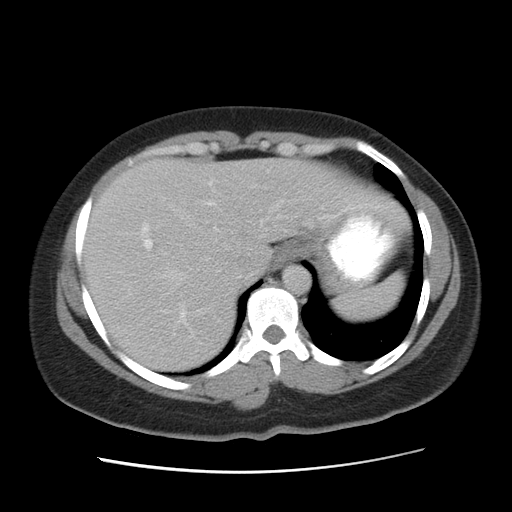
[im 72/83  lung]
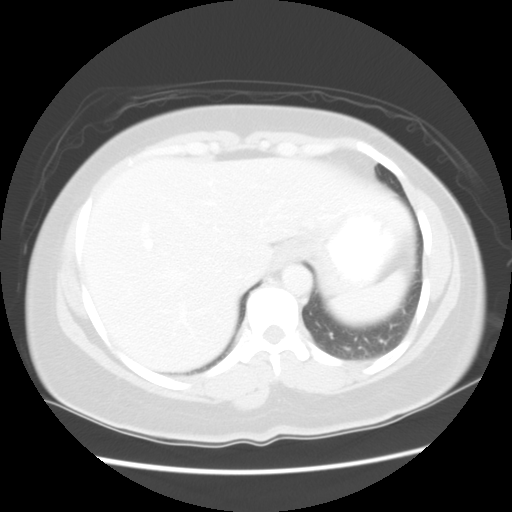

[Series 401: sagittals · sagittal · 0.92mm/px · 7 of 97 slices shown]
[im 11/97  soft-tissue]
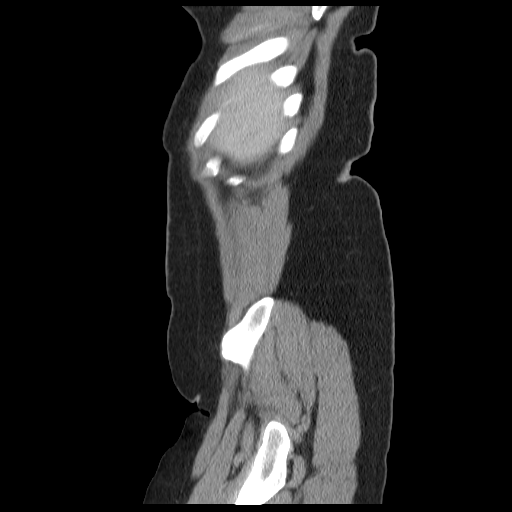
[im 22/97  soft-tissue]
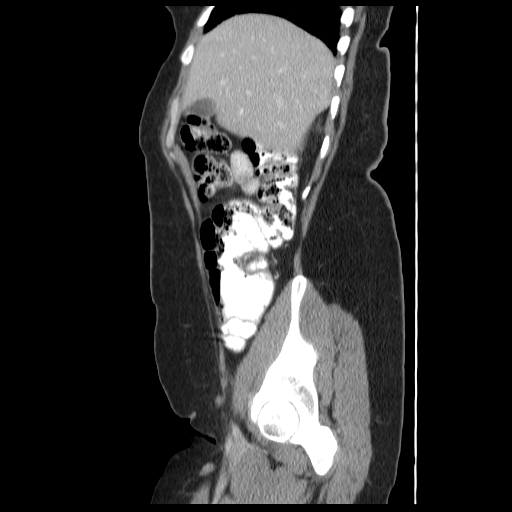
[im 33/97  soft-tissue]
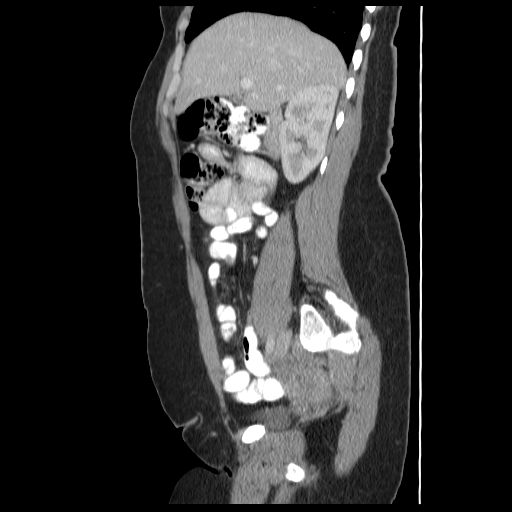
[im 43/97  soft-tissue]
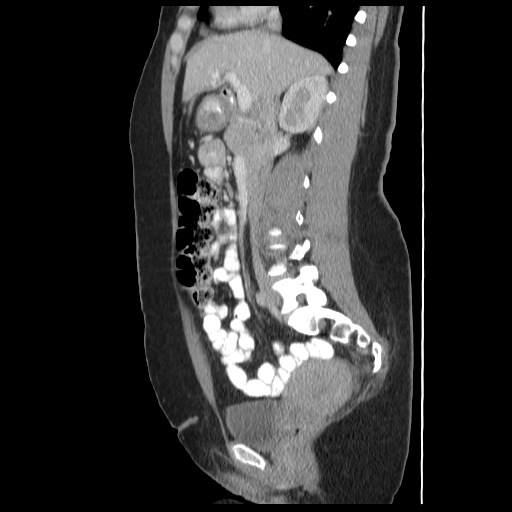
[im 54/97  soft-tissue]
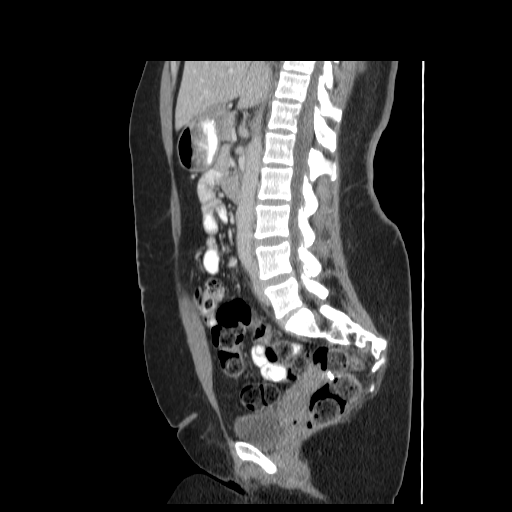
[im 65/97  soft-tissue]
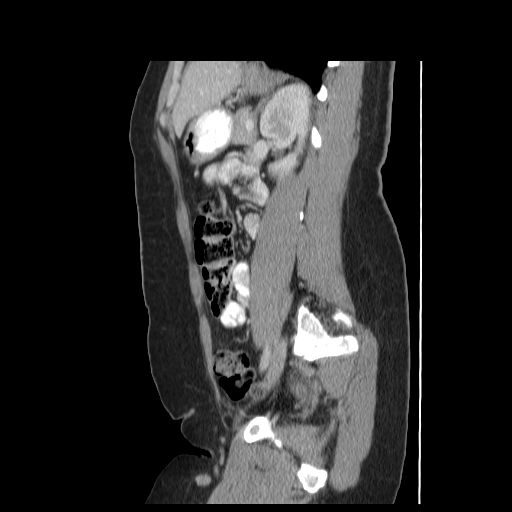
[im 75/97  soft-tissue]
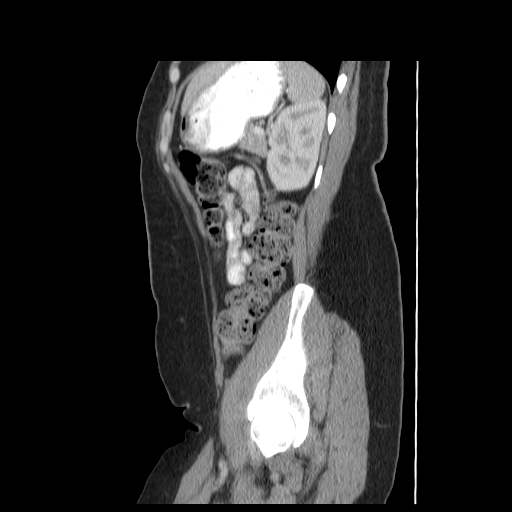

[14 of 32 positions shown; findings below may reference images not displayed]

FINDINGS: The lung bases are clear.  There is a 1.8 cm round lesion
in the left lobe of the liver.  The gallbladder, spleen, pancreas,
adrenal glands, both kidneys have a normal appearance.  A 5 mm left
renal cortical cyst is noted.  The urinary bladder, uterus, and
adnexal regions have an unremarkable appearance.  The bowel is
normal appearing with no evidence of gross inflammation or
obstruction.  The appendix is not seen in the right lower quadrant
but there are no secondary signs of inflammation.  There is no
evidence of abdominal wall hernia.  There is no adenopathy, free
fluid, or pneumoperitoneum.  The osseous structures are
unremarkable.
IMPRESSION: Incidental note is made of a 1.8 cm round mass in the left lobe of
the liver which is likely a solitary cavernous hemangioma.  Follow-
up with ultrasound in 8 - 12 weeks is recommended to document
stability.

The remainder of the exam is negative.

## 2012-08-14 ENCOUNTER — Encounter: Payer: Self-pay | Admitting: Family Medicine

## 2012-08-14 ENCOUNTER — Telehealth: Payer: Self-pay | Admitting: Family Medicine

## 2012-08-14 NOTE — Telephone Encounter (Signed)
Pt is asking to be activated in her My Chart - pls send her activation letter

## 2012-08-20 NOTE — Telephone Encounter (Signed)
Pt informed that I have printed a letter and mailed it to her today. Melanie Cole, [name]

## 2012-12-17 ENCOUNTER — Ambulatory Visit (INDEPENDENT_AMBULATORY_CARE_PROVIDER_SITE_OTHER): Payer: 59 | Admitting: Family Medicine

## 2012-12-17 ENCOUNTER — Encounter: Payer: Self-pay | Admitting: Family Medicine

## 2012-12-17 VITALS — BP 133/78 | HR 78 | Temp 98.6°F | Wt 175.0 lb

## 2012-12-17 DIAGNOSIS — M7650 Patellar tendinitis, unspecified knee: Secondary | ICD-10-CM

## 2012-12-17 DIAGNOSIS — E1159 Type 2 diabetes mellitus with other circulatory complications: Secondary | ICD-10-CM | POA: Insufficient documentation

## 2012-12-17 DIAGNOSIS — N951 Menopausal and female climacteric states: Secondary | ICD-10-CM

## 2012-12-17 DIAGNOSIS — M7651 Patellar tendinitis, right knee: Secondary | ICD-10-CM

## 2012-12-17 DIAGNOSIS — I10 Essential (primary) hypertension: Secondary | ICD-10-CM

## 2012-12-17 DIAGNOSIS — L2089 Other atopic dermatitis: Secondary | ICD-10-CM

## 2012-12-17 DIAGNOSIS — L209 Atopic dermatitis, unspecified: Secondary | ICD-10-CM

## 2012-12-17 MED ORDER — TRIAMCINOLONE ACETONIDE 0.5 % EX OINT: TOPICAL_OINTMENT | Freq: Two times a day (BID) | CUTANEOUS | Status: DC

## 2012-12-17 MED ORDER — DICLOFENAC SODIUM 1 % TD GEL: 2.0000 g | Freq: Four times a day (QID) | TRANSDERMAL | Status: DC

## 2012-12-17 NOTE — Progress Notes (Signed)
  Subjective:    Patient ID: Melanie Cole, female    DOB: 09/04/1965, 47 y.o.   MRN: [id number]  HPI  Physical exam for work insurance. - Form completed during visit. - Pt plans to get her screening work as it will not cost her anything.  Getting Lipids and random glucose.  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  - No smoking.  - Pt back living with her husband.  They have completed prolonged Marital Counseling.   CHRONIC HYPERTENSION  Disease Monitoring  Blood pressure range: running in 120 to mid 130s/ 70's at home.   Chest pain: no  Dyspnea: no   Claudication: no   Medication compliance: yes  Medication Side Effects  Lightheadedness: yes   Urinary frequency: no   Edema: yes, trace at ankles, but decreases with leg elevation    Preventitive Healthcare:  Exercise: no   Diet Pattern: High simple carbohydrate content   RASH  Location: left anterior elbow and right popliteal area Onset: month  Course: stable Self-treated with: nothing             Improvement with treatment: nothing  History Pruritis: yes  Tenderness: no  New medications/antibiotics: no  Tick/insect/pet exposure: no  Recent travel: no  New detergent, new clothing, or other topical exposure: no   Red Flags Feeling ill: no  Fever: no  Mouth lesions: no  Facial/tongue swelling/difficulty breathing:  no  Diabetic or immunocompromised: no History of similar rash in past when using ACEI   Right Knee pain Onset: last 2 to 3 months Location: anterior lateral knee Quality: burn and ache Severity: moderate Function: interferes with kneeling Duration: 2 to 3 months Pattern: intermittent flares Course: stable Radiation: no Relief: nothing Precipitant: kneeling or prolonged sitting Associated Symptoms: none Trauma (Acute or Chronic): no acute trauma. Works in Nursing in patient care Prior  Diagnostic Testing or Treatments: none Relevant PMH/PSH: no immunocompromising conditions.    Review of Systems No fever, No joint swelling. No limb weakness No paraesthesias.     Objective:   Physical Exam VS reviewed. Right knee No gross deformity, ecchymoses, swelling.  No Crepitation. TTP just lateral to the distal patella tendon insertion site. FROM. Negative ant/post drawers. Negative valgus/varus testing. Negative lachmanns. Negative mcmurrays, patellar apprehension, NV intact distally.  GEN: Alert, Cooperative, Groomed, NAD HEENT: PERRL; EAC bilaterally not occluded, TM's translucent with normal LM, (+) LR; COR: RRR, No M/G/R, No JVD, Normal PMI size and location LUNGS: BCTA, No Acc mm use, speaking in full sentences EXT: No peripheral leg edema. Feet without deformity or lesions. Palpable bilateral pedal pulses.  SKIN: lichenification and line accentuation left antebubital fossa with mild to moderate background erythema. Gait: Normal speed, No significant path deviation, Step through +,  Psych: Normal affect/thought/speech/language         Assessment & Plan:

## 2012-12-17 NOTE — Patient Instructions (Addendum)
Perform exercises to stretch iliotibial band of right leg daily Use Diclofenac gel to painful area of right knee four times a day  Use the Triamcinolone ointment daily to itching rashes  Keep skin moisturized with either creams or ointments.  Avoid lotion moisturizers.  Consider Consulting with Dr Annamaria Boots [facility] [phone]) if you are considering a hysterectomy, she can review the various treatment options besides hysterectomy including their benefits and risks.

## 2012-12-18 ENCOUNTER — Encounter: Payer: Self-pay | Admitting: Family Medicine

## 2012-12-18 DIAGNOSIS — L209 Atopic dermatitis, unspecified: Secondary | ICD-10-CM | POA: Insufficient documentation

## 2012-12-18 DIAGNOSIS — M7650 Patellar tendinitis, unspecified knee: Secondary | ICD-10-CM | POA: Insufficient documentation

## 2012-12-18 HISTORY — DX: Atopic dermatitis, unspecified: L20.9

## 2012-12-18 HISTORY — DX: Patellar tendinitis, unspecified knee: M76.50

## 2012-12-18 NOTE — Assessment & Plan Note (Signed)
Topical Voltaren gel Exercises RTC if does not improve or if worsens.

## 2012-12-18 NOTE — Assessment & Plan Note (Signed)
Intermittent menstrual cycles.  No intermenstrual bleed. Prior work up by GYN (Dr Dareen Piano) including TVUS and endometrial biopsy did not find evidence of malignancy Pt recalls being told she could consider a hysterectomy. Pt had some level of disatisfaction with the GYN consultation and ask for other GYN physicians with who she could consult. I made a recommendation and recommended that she ask GYN for full discussion of the variety of treatment options for treatment of climacteric irregular cycles

## 2012-12-18 NOTE — Assessment & Plan Note (Signed)
Adequate blood pressure control.  No evidence of new end organ damage.  Tolerating medication without significant adverse effects.  Plan to continue current blood pressure regiment.   

## 2012-12-18 NOTE — Assessment & Plan Note (Signed)
Primarily in left antecubital fossa. History of burning sensation with steroid topical creams in past. Rx Triamcinolone ointment daily until eczematous changes are resolved, then focus on using protective barrier emollient creams or ointments.

## 2012-12-31 ENCOUNTER — Other Ambulatory Visit: Payer: Self-pay | Admitting: Family Medicine

## 2013-03-06 ENCOUNTER — Other Ambulatory Visit: Payer: Self-pay | Admitting: Family Medicine

## 2013-03-06 MED ORDER — AMLODIPINE BESYLATE 10 MG PO TABS: 10.0000 mg | ORAL_TABLET | Freq: Every day | ORAL | Status: DC

## 2013-08-16 ENCOUNTER — Other Ambulatory Visit: Payer: Self-pay

## 2013-08-16 DIAGNOSIS — Z1231 Encounter for screening mammogram for malignant neoplasm of breast: Secondary | ICD-10-CM

## 2013-09-09 ENCOUNTER — Encounter (INDEPENDENT_AMBULATORY_CARE_PROVIDER_SITE_OTHER): Payer: Self-pay

## 2013-09-09 ENCOUNTER — Ambulatory Visit
Admission: RE | Admit: 2013-09-09 | Discharge: 2013-09-09 | Disposition: A | Discharge status: Home / Self Care | Payer: 59 | Source: Ambulatory Visit

## 2013-09-09 DIAGNOSIS — Z1231 Encounter for screening mammogram for malignant neoplasm of breast: Secondary | ICD-10-CM

## 2013-11-04 ENCOUNTER — Other Ambulatory Visit: Payer: Self-pay | Admitting: *Deleted

## 2013-11-04 DIAGNOSIS — I10 Essential (primary) hypertension: Secondary | ICD-10-CM

## 2013-11-05 MED ORDER — HYDROCHLOROTHIAZIDE 25 MG PO TABS: 25.0000 mg | ORAL_TABLET | Freq: Every day | ORAL | Status: DC

## 2013-12-12 ENCOUNTER — Encounter: Payer: Self-pay | Admitting: Family Medicine

## 2013-12-12 ENCOUNTER — Ambulatory Visit (INDEPENDENT_AMBULATORY_CARE_PROVIDER_SITE_OTHER): Payer: 59 | Admitting: Family Medicine

## 2013-12-12 ENCOUNTER — Ambulatory Visit (HOSPITAL_COMMUNITY)
Admission: RE | Admit: 2013-12-12 | Discharge: 2013-12-12 | Disposition: A | Discharge status: Home / Self Care | Payer: 59 | Source: Ambulatory Visit | Attending: Family Medicine | Admitting: Family Medicine

## 2013-12-12 VITALS — BP 138/88 | HR 68 | Ht 64.0 in | Wt 177.0 lb

## 2013-12-12 DIAGNOSIS — I10 Essential (primary) hypertension: Secondary | ICD-10-CM | POA: Diagnosis present

## 2013-12-12 DIAGNOSIS — Z9189 Other specified personal risk factors, not elsewhere classified: Secondary | ICD-10-CM

## 2013-12-12 DIAGNOSIS — N951 Menopausal and female climacteric states: Secondary | ICD-10-CM

## 2013-12-12 DIAGNOSIS — R1032 Left lower quadrant pain: Secondary | ICD-10-CM

## 2013-12-12 DIAGNOSIS — Z789 Other specified health status: Secondary | ICD-10-CM

## 2013-12-12 DIAGNOSIS — E7800 Pure hypercholesterolemia, unspecified: Secondary | ICD-10-CM | POA: Insufficient documentation

## 2013-12-12 DIAGNOSIS — R079 Chest pain, unspecified: Secondary | ICD-10-CM

## 2013-12-12 DIAGNOSIS — R109 Unspecified abdominal pain: Secondary | ICD-10-CM

## 2013-12-12 DIAGNOSIS — E559 Vitamin D deficiency, unspecified: Secondary | ICD-10-CM

## 2013-12-12 DIAGNOSIS — Z23 Encounter for immunization: Secondary | ICD-10-CM

## 2013-12-12 DIAGNOSIS — E785 Hyperlipidemia, unspecified: Secondary | ICD-10-CM | POA: Diagnosis not present

## 2013-12-12 DIAGNOSIS — R0789 Other chest pain: Secondary | ICD-10-CM

## 2013-12-12 HISTORY — DX: Pure hypercholesterolemia, unspecified: E78.00

## 2013-12-12 LAB — LIPID PANEL
CHOL/HDL RATIO: 3.8 ratio
CHOLESTEROL: 240 mg/dL — AB (ref 0–200)
HDL: 63 mg/dL (ref >39)
LDL Cholesterol: 160 mg/dL — ABNORMAL HIGH (ref 0–99)
Triglycerides: 87 mg/dL (ref <150)
VLDL: 17 mg/dL (ref 0–40)

## 2013-12-12 LAB — BASIC METABOLIC PANEL
BUN: 11 mg/dL (ref 6–23)
CALCIUM: 9.2 mg/dL (ref 8.4–10.5)
CO2: 26 mEq/L (ref 19–32)
Chloride: 103 mEq/L (ref 96–112)
Creat: 0.62 mg/dL (ref 0.50–1.10)
GLUCOSE: 86 mg/dL (ref 70–99)
POTASSIUM: 4.2 meq/L (ref 3.5–5.3)
SODIUM: 138 meq/L (ref 135–145)

## 2013-12-12 LAB — POCT GLYCOSYLATED HEMOGLOBIN (HGB A1C): Hemoglobin A1C: 5.9

## 2013-12-12 NOTE — Patient Instructions (Signed)
I believe your left groin discomfort is related to a peripheral nerve neuralgia likely from physical activity called Ilioinguinal and Genitofemoral Neuralgias.  I believe your left chest pain is coming from the chest wall, not from internal organs of your chest.  If the pain persists for more than 20 minutes continuosly, take two aspirin and contact EMS.    Recommend using a topical capsaicin cream three times a day to the painful area of groin and left breast area to help deplete the pain neurotransmitter, Substance P.  It takes several weeks for the cream to do its work.    Start with the OTC Capsaicin 0.025% cream three times a day to affected areas for one week then change to the more potent OTC Capsaicin 0.075% cream three times a day for two months.   You can restart this sequence of Capsaicin any time the pain recurs.   We are checking your blood sugar, renal function, electrolytes and A1c today.  You EKG looked normal.    You received your Tetanus booster today.   Your blood pressure is under good control with just HCTZ daily.  It is okay to continue off the Amlodipine.   We will likely check a Bone Density test on your next annual visit because of your premature menopause state.  Remember to take at least 800 units of Vitamin D (cholcalciferol) daily to increase strength, build bone and prevent falls.  You should also be taking in at least 1000 mg of calcium daily, either through calcium supplements and dairy food intake.

## 2013-12-16 ENCOUNTER — Encounter: Payer: Self-pay | Admitting: Family Medicine

## 2013-12-16 DIAGNOSIS — E119 Type 2 diabetes mellitus without complications: Secondary | ICD-10-CM | POA: Insufficient documentation

## 2013-12-16 DIAGNOSIS — R232 Flushing: Secondary | ICD-10-CM | POA: Insufficient documentation

## 2013-12-16 DIAGNOSIS — R079 Chest pain, unspecified: Secondary | ICD-10-CM | POA: Insufficient documentation

## 2013-12-16 DIAGNOSIS — Z9189 Other specified personal risk factors, not elsewhere classified: Secondary | ICD-10-CM | POA: Insufficient documentation

## 2013-12-16 DIAGNOSIS — Z8742 Personal history of other diseases of the female genital tract: Secondary | ICD-10-CM | POA: Insufficient documentation

## 2013-12-16 DIAGNOSIS — R0789 Other chest pain: Secondary | ICD-10-CM | POA: Insufficient documentation

## 2013-12-16 DIAGNOSIS — R1032 Left lower quadrant pain: Secondary | ICD-10-CM | POA: Insufficient documentation

## 2013-12-16 DIAGNOSIS — Z780 Asymptomatic menopausal state: Secondary | ICD-10-CM | POA: Insufficient documentation

## 2013-12-16 HISTORY — DX: Flushing: R23.2

## 2013-12-16 HISTORY — DX: Chest pain, unspecified: R07.9

## 2013-12-16 HISTORY — DX: Personal history of other diseases of the female genital tract: Z87.42

## 2013-12-16 NOTE — Assessment & Plan Note (Addendum)
New finding with no immediate further workup Discussed risk of progression to DMT2 given her weight and family hisotroy of diabetes in both her mother and father.  Discussed role of 5 to 7% weight loss and 150 minutes of aerobic exercise weekly to reduce the risk of progression to DMT2.   Melanie Cole believed she had a good understanding of what she needed to do for diet and exercise.  She declined an offer for referral to Nurtritionist at this time.

## 2013-12-16 NOTE — Assessment & Plan Note (Signed)
Non-cardiac Chest Pain Suspect Chest Wall origin of pain. Recommend icing, and use of Capsaicin cream graduated dose increase. Red Flags reviewed.

## 2013-12-16 NOTE — Progress Notes (Signed)
   Subjective:    Patient ID: Melanie Cole, female    DOB: 18-Mar-1966, 48 y.o.   MRN: [id number]  HPI  CHEST PAIN Location: left parasternal Quality: sharp  Duration: seconds to a minute Onset (rest, exertion): rest or exertion Frequency: multiple times a day Course: Stable Radiation: no Better with: time Worse with: no precipitating movements.  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, iADL or ADLs Pattern: no pattern, occurs multiple times throughout the day Course: stable Radiation: no Relief: spontaneous Precipitant: pt temporally relates to starting home video work-outs recently Associated Symptoms: no back pain, no hematuria, (+) undiagnosed vaginal pain for which patient will be having a Dilation and Curretage by her Gyncologist at [facility] (Acute or Chronic): none recalled Prior Diagnostic Testing or Treatments: TVUS per pt in 2014 by her GYN showed slightly thicken endometrium with recent negative endometrial biopsy Relevant PMH/PSH: CT Abdomin (09/2010) for RLQ pain: No evidence of hernia.  Normal osseous structures. No adenopathy.  Normal bladder, uterus and adnexa appearances.   CHRONIC HYPERTENSION  Disease Monitoring  Blood pressure range: not checking outside office visit   Chest pain: yes, see above   Dyspnea: no   Claudication: no   Medication compliance: no, stopped taking amlodipine to reduce pill burden.   Still taking HCTZ daily  Medication Side Effects  Lightheadedness: no    Urinary frequency: no   Edema: no    Preventitive Healthcare:  Exercise: yes   Diet Pattern: regular  Salt Restriction: no     Review of Systems  Constitutional: Negative for fever, chills, activity change, appetite change and unexpected weight change.  Gastrointestinal: Negative for diarrhea, constipation and blood in stool.  Genitourinary: Positive for vaginal bleeding. Negative for dysuria, frequency, hematuria, vaginal discharge, vaginal pain and dyspareunia.  Musculoskeletal: Negative for joint swelling.  Skin: Negative for rash.  Psychiatric/Behavioral: Negative for dysphoric mood.       Objective:   Physical Exam  Constitutional: She appears well-nourished. No distress.  HENT:  Head: Normocephalic and atraumatic.  Right Ear: External ear normal.  Left Ear: External ear normal.  Eyes: Conjunctivae are normal. Pupils are equal, round, and reactive to light. No scleral icterus.  Neck: Normal range of motion. Neck supple. No thyromegaly present.  Cardiovascular: Normal rate, regular rhythm and normal heart sounds.   No murmur heard. Pulses:      Dorsalis pedis pulses are 1+ on the right side, and 1+ on the left side.       Posterior tibial pulses are 1+ on the right side, and 1+ on the left side.  Tender left parasternal ~ Rib 2-3 area.  No palpable fullness,  No overlying erythema.   Musculoskeletal:       Legs: Lymphadenopathy:    She has no cervical adenopathy.          Assessment & Plan:  [facility] Provider Health Screening Form completed and Fax'd to number on form.

## 2013-12-16 NOTE — Assessment & Plan Note (Signed)
New problem with no further workup Working explanation is a Ilioinguinal or Genitofemoral neuralgia from recent exercise increase. Recommend icing site after exercise and use of graduated Capsaicin cream application. Red flags reviewed.

## 2013-12-16 NOTE — Assessment & Plan Note (Signed)
Recommended restart of Vitamin D 1000 units daily with calcium increase from supplements and diet.

## 2013-12-16 NOTE — Assessment & Plan Note (Signed)
Lab Results  Component Value Date   CHOL 240* 12/12/2013   HDL 63 12/12/2013   LDLCALC 160* 12/12/2013   TRIG 87 12/12/2013   CHOLHDL 3.8 12/12/2013   ACC/AHA 10-year Cardiovascular Event Risk = 3.4% No indication for statin at this time. Patient is At-risk of Diabetes given A1c of 5.9% today.

## 2013-12-16 NOTE — Assessment & Plan Note (Addendum)
Adequate blood pressure control.  No evidence of new end organ damage.  Tolerating medication without significant adverse effects.  Plan to continue current blood pressure regiment HCTZ.  Did not recommend restart of amlodipine.

## 2013-12-19 ENCOUNTER — Other Ambulatory Visit: Payer: Self-pay | Admitting: Family Medicine

## 2014-03-05 ENCOUNTER — Other Ambulatory Visit: Payer: Self-pay | Admitting: Family Medicine

## 2014-04-08 ENCOUNTER — Encounter: Payer: Self-pay | Admitting: Family Medicine

## 2014-04-08 DIAGNOSIS — N393 Stress incontinence (female) (male): Secondary | ICD-10-CM

## 2014-04-08 HISTORY — DX: Stress incontinence (female) (male): N39.3

## 2014-04-22 ENCOUNTER — Other Ambulatory Visit: Payer: Self-pay | Admitting: Family Medicine

## 2014-06-09 ENCOUNTER — Other Ambulatory Visit: Payer: Self-pay | Admitting: *Deleted

## 2014-06-09 MED ORDER — HYDROCHLOROTHIAZIDE 25 MG PO TABS: 25.0000 mg | ORAL_TABLET | Freq: Every day | ORAL | Status: DC

## 2014-12-23 ENCOUNTER — Other Ambulatory Visit: Payer: Self-pay

## 2014-12-23 DIAGNOSIS — Z1231 Encounter for screening mammogram for malignant neoplasm of breast: Secondary | ICD-10-CM

## 2015-02-12 ENCOUNTER — Other Ambulatory Visit (HOSPITAL_COMMUNITY)
Admission: RE | Admit: 2015-02-12 | Discharge: 2015-02-12 | Disposition: A | Discharge status: Home / Self Care | Payer: 59 | Source: Ambulatory Visit | Attending: Family Medicine | Admitting: Family Medicine

## 2015-02-12 ENCOUNTER — Ambulatory Visit
Admission: RE | Admit: 2015-02-12 | Discharge: 2015-02-12 | Disposition: A | Discharge status: Home / Self Care | Payer: 59 | Source: Ambulatory Visit

## 2015-02-12 ENCOUNTER — Encounter: Payer: Self-pay | Admitting: Family Medicine

## 2015-02-12 ENCOUNTER — Ambulatory Visit (INDEPENDENT_AMBULATORY_CARE_PROVIDER_SITE_OTHER): Payer: 59 | Admitting: Family Medicine

## 2015-02-12 VITALS — BP 136/89 | HR 79 | Temp 98.1°F | Ht 64.0 in | Wt 176.4 lb

## 2015-02-12 DIAGNOSIS — Z124 Encounter for screening for malignant neoplasm of cervix: Secondary | ICD-10-CM | POA: Diagnosis not present

## 2015-02-12 DIAGNOSIS — R7303 Prediabetes: Secondary | ICD-10-CM | POA: Diagnosis not present

## 2015-02-12 DIAGNOSIS — Z01419 Encounter for gynecological examination (general) (routine) without abnormal findings: Secondary | ICD-10-CM | POA: Diagnosis not present

## 2015-02-12 DIAGNOSIS — K769 Liver disease, unspecified: Secondary | ICD-10-CM

## 2015-02-12 DIAGNOSIS — Z8742 Personal history of other diseases of the female genital tract: Secondary | ICD-10-CM

## 2015-02-12 DIAGNOSIS — Z1231 Encounter for screening mammogram for malignant neoplasm of breast: Secondary | ICD-10-CM

## 2015-02-12 DIAGNOSIS — Z79899 Other long term (current) drug therapy: Secondary | ICD-10-CM | POA: Diagnosis not present

## 2015-02-12 DIAGNOSIS — E559 Vitamin D deficiency, unspecified: Secondary | ICD-10-CM

## 2015-02-12 DIAGNOSIS — E7800 Pure hypercholesterolemia, unspecified: Secondary | ICD-10-CM

## 2015-02-12 DIAGNOSIS — R252 Cramp and spasm: Secondary | ICD-10-CM

## 2015-02-12 DIAGNOSIS — I10 Essential (primary) hypertension: Secondary | ICD-10-CM

## 2015-02-12 DIAGNOSIS — K7689 Other specified diseases of liver: Secondary | ICD-10-CM

## 2015-02-12 DIAGNOSIS — R160 Hepatomegaly, not elsewhere classified: Secondary | ICD-10-CM

## 2015-02-12 LAB — LIPID PANEL
Cholesterol: 259 mg/dL — ABNORMAL HIGH (ref 125–200)
HDL: 79 mg/dL (ref >=46)
LDL CALC: 165 mg/dL — AB (ref <130)
TRIGLYCERIDES: 74 mg/dL (ref <150)
Total CHOL/HDL Ratio: 3.3 Ratio (ref <=5.0)
VLDL: 15 mg/dL (ref <30)

## 2015-02-12 LAB — COMPREHENSIVE METABOLIC PANEL
ALBUMIN: 4.5 g/dL (ref 3.6–5.1)
ALK PHOS: 58 U/L (ref 33–115)
ALT: 13 U/L (ref 6–29)
AST: 18 U/L (ref 10–35)
BUN: 11 mg/dL (ref 7–25)
CALCIUM: 10.2 mg/dL (ref 8.6–10.2)
CO2: 28 mmol/L (ref 20–31)
Chloride: 98 mmol/L (ref 98–110)
Creat: 0.58 mg/dL (ref 0.50–1.10)
Glucose, Bld: 83 mg/dL (ref 65–99)
POTASSIUM: 4.3 mmol/L (ref 3.5–5.3)
Sodium: 138 mmol/L (ref 135–146)
TOTAL PROTEIN: 7.9 g/dL (ref 6.1–8.1)
Total Bilirubin: 0.8 mg/dL (ref 0.2–1.2)

## 2015-02-12 LAB — POCT GLYCOSYLATED HEMOGLOBIN (HGB A1C): HEMOGLOBIN A1C: 5.7

## 2015-02-12 LAB — MAGNESIUM: MAGNESIUM: 2 mg/dL (ref 1.5–2.5)

## 2015-02-12 MED ORDER — HYDROCHLOROTHIAZIDE 25 MG PO TABS: 25.0000 mg | ORAL_TABLET | Freq: Every day | ORAL | Status: DC

## 2015-02-12 NOTE — Patient Instructions (Signed)
Blood pressure control looks good.  Continue taking your HCTZ blood pressure medication daily. We are checking a Complete metabolic panel today to look for end organ damage from high blood pressure and electrolyte problems from the HCTZ. Checking Vitamin D level.   Restart Vitamin D 1000 units daily.  Use Over-the-Counter Vitamin D3 (Cholcalciferol is generic).  Go for Liver Ultrasound to follow up a likely liver hemangioma found incidentally on a abdominal CT in 2012.  Hemiangiomas are benign blood vessel collections that are common in the general population.  If there is a change in the liver finding from 2012, we will talk about any further workup recommended.   Your A1c of 5.7 % is still in the category of being at-risk for diabetes.  Continue your exercise regiment and weight control.

## 2015-02-13 ENCOUNTER — Encounter: Payer: Self-pay | Admitting: Family Medicine

## 2015-02-13 DIAGNOSIS — K769 Liver disease, unspecified: Secondary | ICD-10-CM | POA: Insufficient documentation

## 2015-02-13 HISTORY — DX: Liver disease, unspecified: K76.9

## 2015-02-13 LAB — VITAMIN D 25 HYDROXY (VIT D DEFICIENCY, FRACTURES): VIT D 25 HYDROXY: 25 ng/mL — AB (ref 30–100)

## 2015-02-13 NOTE — Assessment & Plan Note (Addendum)
Adequate blood pressure control.  No evidence of new end organ damage.  Tolerating medication without significant adverse effects.  Plan to continue current blood pressure regiment.  BMET for end organ damage and mont

## 2015-02-13 NOTE — Assessment & Plan Note (Addendum)
Established problem that has improved.  No recurence of bleed.  No hysterectomy performed.  Patient has not decided if to return to Dr Philis Pique for hysterectomy yet.

## 2015-02-13 NOTE — Progress Notes (Signed)
   Subjective:    Patient ID: Melanie Cole, female    DOB: [birth year], 49 y.o.   MRN: [id number]  HPI CHRONIC HYPERTENSION  Disease Monitoring  Blood pressure range: cheecks at home and work, usually in 130s/80s.   Chest pain: yes, brief, spontaneous resolve,   Dyspnea: no   Claudication: no   Medication compliance: yes, HCTZ  Medication Side Effects  Lightheadedness: no   Urinary frequency: no   Edema: no     Preventitive Healthcare:  Exercise: yes   Diet Pattern: good  Salt Restriction: no  Prediabetes Onset in 12/2013 by A1c 5.9 Disease Monitoring  Blood Sugar Ranges: not checking  Polyuria: no   Visual problems: no  Stable weight Taking aerobic exercise 30 to 60 minutes 3 days a week  Liver lesion - Incidental finding on abdomin ct in 2012 during work up for abdominal pain - no abdominal pain,  No jaundice, no dark urine. No nausea, no vomiting, no weight loss, no fever  Hyperlidemia - Longstanding issue - High LDL and High HDL - not on medication.   History of Post menopausal bleeding - Onset last year. - Consulted GYN near [facility] - Was considering hysterectomy, but did not follow up on it - no recurrence of vagianl bleeding.  Postmenopausal Vasomotor symptoms - Onset about 4 years ago - hot flashes throughout day and sometimes at night awaken from sleep - has not tried OTC medicaation - Not interested in Rx treatments.  Plans to "bear through it".   Urinary Incontinence - Onset 3 to 4 years ago - small voids with cough, lift - No urge to void with loss of urine - No dysuria, frequency, nocturioa - GYN who was going to do hysterectomy was planning a bladder sling procedure at the same time.  Neither has been doen.     Review of Systems     Objective:   Physical Exam  VS reviewed GEN: Alert, Cooperative, Groomed, NAD HEENT: PERRL; EAC bilaterally not occluded, TM's translucent with normal LM, (+) LR;                No cervical  LAN, No thyromegaly, No palpable masses COR: RRR, No M/G/R, No JVD, Normal PMI size and location LUNGS: BCTA, No Acc mm use, speaking in full sentences ABDOMEN: (+)BS, soft, NT, ND, No HSM, No palpable masses Genitalia:  Normal introitus for age, no external lesions, no vaginal discharge, mucosa pink and moist, no vaginal or cervical lesions, no vaginal atrophy, no friaility or hemorrhage, Pap obtained Half-speculum exam: valsalva without significent anterior wall descent.  No urine leakage. No urethra funneling.  EXT: No peripheral leg edema. Feet without deformity or lesions. Palpable bilateral pedal pulses.  SKIN: No lesion nor rashes of face/trunk/extremities Neuro: Moving all extremities spontaneously and symmetrically  Gait: Normal speed, No significant path deviation, Step through +,  Psych: Normal affect/thought/speech/language       Assessment & Plan:

## 2015-02-16 ENCOUNTER — Encounter: Payer: Self-pay | Admitting: Family Medicine

## 2015-02-16 LAB — CYTOLOGY - PAP

## 2015-03-05 ENCOUNTER — Ambulatory Visit
Admission: RE | Admit: 2015-03-05 | Discharge: 2015-03-05 | Disposition: A | Discharge status: Home / Self Care | Payer: 59 | Source: Ambulatory Visit | Attending: Family Medicine | Admitting: Family Medicine

## 2015-03-05 DIAGNOSIS — R160 Hepatomegaly, not elsewhere classified: Secondary | ICD-10-CM

## 2015-03-06 ENCOUNTER — Encounter: Payer: Self-pay | Admitting: Family Medicine

## 2015-12-10 ENCOUNTER — Ambulatory Visit (INDEPENDENT_AMBULATORY_CARE_PROVIDER_SITE_OTHER): Payer: 59 | Admitting: Family Medicine

## 2015-12-10 ENCOUNTER — Encounter: Payer: Self-pay | Admitting: Family Medicine

## 2015-12-10 VITALS — BP 155/97 | HR 73 | Temp 98.6°F | Wt 190.0 lb

## 2015-12-10 DIAGNOSIS — M609 Myositis, unspecified: Secondary | ICD-10-CM | POA: Diagnosis not present

## 2015-12-10 DIAGNOSIS — Z9189 Other specified personal risk factors, not elsewhere classified: Secondary | ICD-10-CM | POA: Diagnosis not present

## 2015-12-10 DIAGNOSIS — I10 Essential (primary) hypertension: Secondary | ICD-10-CM

## 2015-12-10 DIAGNOSIS — M791 Myalgia: Secondary | ICD-10-CM | POA: Diagnosis not present

## 2015-12-10 DIAGNOSIS — R202 Paresthesia of skin: Secondary | ICD-10-CM | POA: Diagnosis not present

## 2015-12-10 DIAGNOSIS — D1803 Hemangioma of intra-abdominal structures: Secondary | ICD-10-CM

## 2015-12-10 DIAGNOSIS — E049 Nontoxic goiter, unspecified: Secondary | ICD-10-CM | POA: Diagnosis not present

## 2015-12-10 DIAGNOSIS — R140 Abdominal distension (gaseous): Secondary | ICD-10-CM | POA: Insufficient documentation

## 2015-12-10 DIAGNOSIS — Z23 Encounter for immunization: Secondary | ICD-10-CM | POA: Diagnosis not present

## 2015-12-10 DIAGNOSIS — E559 Vitamin D deficiency, unspecified: Secondary | ICD-10-CM

## 2015-12-10 DIAGNOSIS — R110 Nausea: Secondary | ICD-10-CM | POA: Insufficient documentation

## 2015-12-10 DIAGNOSIS — R5383 Other fatigue: Secondary | ICD-10-CM

## 2015-12-10 DIAGNOSIS — E785 Hyperlipidemia, unspecified: Secondary | ICD-10-CM

## 2015-12-10 DIAGNOSIS — IMO0001 Reserved for inherently not codable concepts without codable children: Secondary | ICD-10-CM

## 2015-12-10 HISTORY — DX: Paresthesia of skin: R20.2

## 2015-12-10 HISTORY — DX: Abdominal distension (gaseous): R14.0

## 2015-12-10 LAB — CBC WITH DIFFERENTIAL/PLATELET
BASOS PCT: 0 %
Basophils Absolute: 0 cells/uL (ref 0–200)
EOS PCT: 2 %
Eosinophils Absolute: 110 cells/uL (ref 15–500)
HCT: 44.8 % (ref 35.0–45.0)
Hemoglobin: 15.4 g/dL (ref 11.7–15.5)
LYMPHS ABS: 2530 {cells}/uL (ref 850–3900)
LYMPHS PCT: 46 %
MCH: 31.1 pg (ref 27.0–33.0)
MCHC: 34.4 g/dL (ref 32.0–36.0)
MCV: 90.5 fL (ref 80.0–100.0)
MPV: 9.2 fL (ref 7.5–12.5)
Monocytes Absolute: 330 cells/uL (ref 200–950)
Monocytes Relative: 6 %
NEUTROS PCT: 46 %
Neutro Abs: 2530 cells/uL (ref 1500–7800)
Platelets: 326 10*3/uL (ref 140–400)
RBC: 4.95 MIL/uL (ref 3.80–5.10)
RDW: 13.4 % (ref 11.0–15.0)
WBC: 5.5 10*3/uL (ref 3.8–10.8)

## 2015-12-10 LAB — COMPLETE METABOLIC PANEL WITH GFR
ALBUMIN: 4.5 g/dL (ref 3.6–5.1)
ALK PHOS: 62 U/L (ref 33–115)
ALT: 21 U/L (ref 6–29)
AST: 21 U/L (ref 10–35)
BUN: 11 mg/dL (ref 7–25)
CALCIUM: 9.7 mg/dL (ref 8.6–10.2)
CO2: 26 mmol/L (ref 20–31)
CREATININE: 0.6 mg/dL (ref 0.50–1.10)
Chloride: 100 mmol/L (ref 98–110)
GFR, Est Non African American: >89 mL/min (ref >=60)
Glucose, Bld: 90 mg/dL (ref 65–99)
POTASSIUM: 4 mmol/L (ref 3.5–5.3)
Sodium: 139 mmol/L (ref 135–146)
Total Bilirubin: 0.6 mg/dL (ref 0.2–1.2)
Total Protein: 7.9 g/dL (ref 6.1–8.1)

## 2015-12-10 LAB — TSH: TSH: 0.71 mIU/L

## 2015-12-10 LAB — POCT GLYCOSYLATED HEMOGLOBIN (HGB A1C): HEMOGLOBIN A1C: 5.9

## 2015-12-10 LAB — POCT SEDIMENTATION RATE: POCT SED RATE: 21 mm/h (ref 0–22)

## 2015-12-10 NOTE — Patient Instructions (Addendum)
We will call with Ultrasound of liver and gallbladder cavernous hemangiomas to make sure they have not increased in size and not increased in number.  Start Amlodipine 5 mg tablet.  Take one tablet daily.  After 5 days of Amlodipine, if your home blood pressure measurements stay above 135/85 on average, increase to taking two Amlodipine tablets (10 mg) daily.  Keep a record of your blood pressure measurements and bring them with you to the next office visit with Dr Shray Hunley.    We are checking a CMET, CBC with Diff, TSH  For thyroid function and Antinuclear Antibody (ANA test) for rheumatoid-like autoimmune conditions that could cause the variety of complaints with which you are presenting.   Dr Earlyn Sylvan will call you if your tests are not good. Otherwise he will send you a letter.  If you sign up for MyChart online, you will be able to see your test results once Dr Syrena Burges has reviewed them.  If you do not hear from [place] with in 2 weeks please call our office

## 2015-12-10 NOTE — Progress Notes (Signed)
   Subjective:    Patient ID: Melanie Cole, female    DOB: November 05, 1965, 50 y.o.   MRN: [id number]  HPI CHRONIC HYPERTENSION  Disease Monitoring  Blood pressure range: has been checking at work with SBP up to 170s and DBPs up to around 105.   Chest pain: no  Dyspnea: no   Claudication: no   Medication compliance: yes, HCTZ 25 mg daily.  Takes the majority of the time  Medication Side Effects  Lightheadedness: no   Urinary frequency: no   Edema: no     Preventitive Healthcare:  Exercise: decreased   Diet Pattern: trying to eat well  Salt Restriction: no  Prediabetes Onset in 12/2013 by A1c 5.9 Disease Monitoring  Blood Sugar Ranges: not checking  Polyuria: no   Visual problems: no  Increased weight Decreased level of exercise  Liver lesion - Incidental finding on abdomin ct in 2012 during work up for abdominal pain - Abdominal US (03/05/15) with new lesion left lobe of liver  c/w hemangioma, one hyperechoic lesion right lobe c/w Cavernous hemanigoma, third irregular lesion adjacent to gallbladder which is also likely a cavernous hemangioma - no abdominal pain,  No jaundice, no dark urine. No nausea, no vomiting, no weight loss, no fever  Hyperlidemia - Longstanding issue - High LDL and High HDL - not on medication.   Variety of symptoms - intermittent abdominal bloating, intermittent tingling in both hands' fingertips. - Intermittent nausea without vomiting - unrelated to meals.  - intermittently very cold in rooms that others consider hot.  - shiney skin of shins, - weight gain while trying to restrict calories.  -  No diarrhea/constipation; no menses for over a year - (+) chronic intermittent flusing. No joint swelling. No dental or oral problems. - Pt denies stress in relationships, work, finances.  Denies sad mood or loss or pleasure in life.    SH: No smoking. Working in Diplomatic Services operational officer for [employer].  Occasional shifts as Therapist, sports at [employer].   Review of  Systems . No change in skin texture nor in skin No shortness of breath, no cough No dysuria (+) chronic stress incontinence.  No urgency.  No nocturia.      Objective:   Physical Exam  VS reviewed GEN: Alert, Cooperative, Groomed, NAD HEENT: PERRL; Palpable generous size thyroid with symmetry and no palpable masses EAC bilaterally not occluded, TM's translucent with normal LM, (+) LR;                No cervical LAN, No thyromegaly, No palpable masses COR: RRR, No M/G/R, No JVD, Normal PMI size and location LUNGS: BCTA, No Acc mm use, speaking in full sentences ABDOMEN: (+)BS, soft, NT, ND, No HSM, No palpable masses, no jaundice EXT: No peripheral leg edema. Feet without deformity or lesions. Palpable bilateral pedal pulses, bil flat feet.  SKIN: No lesion nor rashes of face/trunk/extremities Neuro: Moving all extremities spontaneously and symmetrically  Gait: Normal speed, No significant path deviation, Step through +,  Psych: Normal affect/thought/speech/language       Assessment & Plan:

## 2015-12-10 NOTE — Assessment & Plan Note (Signed)
Established problem Asymptomatic  Given irregularity of likely cavernous hemagnioma adjacent to the Gallbladder, will repeat US to assess for changes.

## 2015-12-10 NOTE — Assessment & Plan Note (Signed)
Established problem Uncontrollled Nearly 30 pound weight gain. Starting trainer-guided exercise program Start Amlodipine 5 mg tablet, one tablet daily.  If home BP not at goal < 135/85, then increase to amlodipine 5 mg tablet, take two tablets (10 mg) daily

## 2015-12-10 NOTE — Assessment & Plan Note (Signed)
New problem Checking CMET, CBC, EST, TSH, and ANA Will check SPEP/UPEP if abnormal protein or anemia

## 2015-12-10 NOTE — Assessment & Plan Note (Signed)
Checking lipid panel today. 

## 2015-12-10 NOTE — Assessment & Plan Note (Signed)
Established problem Uncontrollled as pt has not been taking vitamin d supplement Encouraged patient to start Vitamin D 1000 to 2000 mIU daily

## 2015-12-10 NOTE — Assessment & Plan Note (Signed)
New complaint Intermittent No relation to meals Recommend keep nausea/blotting diary. She was interested in GI referral.

## 2015-12-10 NOTE — Assessment & Plan Note (Signed)
Established problem Lab Results  Component Value Date   HGBA1C 5.9 12/10/2015  Stable A1c from last year Nearly 30 pound weight gain in last year.  Melanie Cole is planning start of trainer-guided exercise program.  Monitor every 6 to 12 months.

## 2015-12-11 ENCOUNTER — Telehealth: Payer: Self-pay | Admitting: Family Medicine

## 2015-12-11 DIAGNOSIS — I10 Essential (primary) hypertension: Secondary | ICD-10-CM

## 2015-12-11 LAB — ANA: ANA: NEGATIVE

## 2015-12-11 NOTE — Telephone Encounter (Signed)
Will forward to MD. Shifa Brisbon,[name]  

## 2015-12-11 NOTE — Telephone Encounter (Signed)
Pt is calling back because the pharmacy still has not received her amlodipine. She was suppose to start this medication and to also keep a record of her BP and to increase the medication if need be. Please call her and let her know what she is suppose to do. jw

## 2015-12-11 NOTE — Telephone Encounter (Signed)
Had appt with dr Wendy Poet.  He said he was putting her on a new medication-amlodipine.  It was not called into her pharmacy.  [facility] in [place]. Please advise

## 2015-12-12 ENCOUNTER — Encounter: Payer: Self-pay | Admitting: Family Medicine

## 2015-12-14 ENCOUNTER — Telehealth: Payer: Self-pay | Admitting: Family Medicine

## 2015-12-14 MED ORDER — AMLODIPINE BESYLATE 5 MG PO TABS: 5.0000 mg | ORAL_TABLET | Freq: Every day | ORAL | 1 refills | Status: DC

## 2015-12-14 NOTE — Telephone Encounter (Signed)
Please let Melanie Cole know that her new blood pressure medication, Amlodipine, has been sent to her pharmacy.  Please let her know that I apologize for the delay in sending in the new prescription in, it was my fault.

## 2015-12-14 NOTE — Telephone Encounter (Signed)
Had appt on sept 7 with dr Wendy Poet.  Additional  meds were added.  After multiple calls to the office and trips to the pharmacy, the medications have not been called in. She is not sure if the messages are even being sent and received by Dr McDiamid.  Please advise

## 2015-12-14 NOTE — Telephone Encounter (Signed)
Pt informed of rx sent to her pharmacy.

## 2015-12-16 ENCOUNTER — Encounter: Payer: Self-pay | Admitting: Family Medicine

## 2015-12-16 ENCOUNTER — Ambulatory Visit (HOSPITAL_COMMUNITY)
Admission: RE | Admit: 2015-12-16 | Discharge: 2015-12-16 | Disposition: A | Discharge status: Home / Self Care | Payer: 59 | Source: Ambulatory Visit | Attending: Family Medicine | Admitting: Family Medicine

## 2015-12-16 DIAGNOSIS — D1803 Hemangioma of intra-abdominal structures: Secondary | ICD-10-CM | POA: Diagnosis present

## 2015-12-16 DIAGNOSIS — R140 Abdominal distension (gaseous): Secondary | ICD-10-CM

## 2015-12-16 DIAGNOSIS — R110 Nausea: Secondary | ICD-10-CM

## 2015-12-16 DIAGNOSIS — K769 Liver disease, unspecified: Secondary | ICD-10-CM | POA: Insufficient documentation

## 2016-02-02 ENCOUNTER — Encounter: Payer: Self-pay | Admitting: Family Medicine

## 2016-02-09 ENCOUNTER — Encounter: Payer: Self-pay | Admitting: Family Medicine

## 2016-02-09 NOTE — Progress Notes (Signed)
Letter of verification  Dear Ms Yelinek,   You received immunization vaccination with Influenza,inj,Quad PF,36+ Mos on 12/10/15 at our office.    Please let me know if you need any other information or verification.

## 2016-02-11 ENCOUNTER — Other Ambulatory Visit: Payer: Self-pay | Admitting: Behavioral Health

## 2016-02-11 DIAGNOSIS — Z1231 Encounter for screening mammogram for malignant neoplasm of breast: Secondary | ICD-10-CM

## 2016-02-17 ENCOUNTER — Ambulatory Visit: Payer: 59

## 2016-05-09 ENCOUNTER — Other Ambulatory Visit: Payer: Self-pay | Admitting: Family Medicine

## 2016-06-23 ENCOUNTER — Ambulatory Visit
Admission: RE | Admit: 2016-06-23 | Discharge: 2016-06-23 | Disposition: A | Discharge status: Home / Self Care | Payer: 59 | Source: Ambulatory Visit | Attending: Family Medicine | Admitting: Family Medicine

## 2016-06-23 DIAGNOSIS — Z1231 Encounter for screening mammogram for malignant neoplasm of breast: Secondary | ICD-10-CM

## 2016-10-12 ENCOUNTER — Telehealth: Payer: Self-pay | Admitting: Family Medicine

## 2016-10-12 NOTE — Telephone Encounter (Signed)
Pt is calling because she has some issues with her BP, she has made an appointment for 11/10/16 and this is the soonest. She would like for the doctor to call her and discuss some options to do in the mean time. jw

## 2016-10-13 MED ORDER — DILTIAZEM HCL ER COATED BEADS 120 MG PO CP24: 120.0000 mg | ORAL_CAPSULE | Freq: Every day | ORAL | 1 refills | Status: DC

## 2016-10-13 NOTE — Telephone Encounter (Signed)
Continuing to have problems with BP.  She stopped taking amlodipine in April because of side effects. She is taking HCTZ but it is not controlling BP.  Could dr Wendy Poet call in something until she can get in for her appt in august?  The highest it has been is 188/102.  She is having headaches and vision problems. Please advise

## 2016-10-13 NOTE — Telephone Encounter (Signed)
Ms Pucci experience peripheral edema and intolerable dry mouth with amlodipine that resolved with stopping amlodipine.  Starting Cardizem CD 120 mg daily. Home BP measurements.  Bring to office visit in August. Call office if symptoms and signs recur.

## 2016-10-27 ENCOUNTER — Telehealth: Payer: Self-pay | Admitting: *Deleted

## 2016-10-27 NOTE — Telephone Encounter (Signed)
Pt called back and I read her the message that Dr. McDiarmid left for her. She understood and will let the doctor if increasing the dosage helps. jw

## 2016-10-27 NOTE — Telephone Encounter (Signed)
Pt said you changed her bp medicine and its still not controlling her bp. Wants to know what should be done. Her bp readings are:  7/17- 140/99 7/19- 139/99 7/21- 146/90 7/24- 173/110 7/25- 147/95  She does have som discomfort in her chest, some nausea and fatigue, but not all the time. Please advise. Melanie Cole Kennon Holter, [name]

## 2016-10-27 NOTE — Telephone Encounter (Signed)
Ask Ms Mullins to take two capsules of her Cardizem 120 mg a day, instead of one.  If this is dose is working, Dr McDiarmid will send in a Rx for Cardizem 240 mg pills.

## 2016-11-07 ENCOUNTER — Other Ambulatory Visit: Payer: Self-pay | Admitting: Family Medicine

## 2016-11-07 MED ORDER — DILTIAZEM HCL ER COATED BEADS 240 MG PO CP24: 240.0000 mg | ORAL_CAPSULE | Freq: Every day | ORAL | 2 refills | Status: DC

## 2016-11-07 NOTE — Telephone Encounter (Signed)
Patient is calling to request refill of:  Name of Medication(s):  Cardizem Last date of OV:   Pharmacy:  [facility]  Pt was told to double up on her dosage. She will be out tomorrow so she will need a new prescription called in with the new dosage.  Will route refill request to Clinic RN.  Discussed with patient policy to call pharmacy for future refills.  Also, discussed refills may take up to 48 hours to approve or deny.  Melanie Cole

## 2016-11-10 ENCOUNTER — Ambulatory Visit (INDEPENDENT_AMBULATORY_CARE_PROVIDER_SITE_OTHER): Payer: 59 | Admitting: Family Medicine

## 2016-11-10 ENCOUNTER — Encounter: Payer: Self-pay | Admitting: Family Medicine

## 2016-11-10 VITALS — BP 138/96 | HR 72 | Temp 99.4°F | Ht 64.0 in | Wt 193.0 lb

## 2016-11-10 DIAGNOSIS — I10 Essential (primary) hypertension: Secondary | ICD-10-CM

## 2016-11-10 DIAGNOSIS — E669 Obesity, unspecified: Secondary | ICD-10-CM

## 2016-11-10 DIAGNOSIS — Z9189 Other specified personal risk factors, not elsewhere classified: Secondary | ICD-10-CM | POA: Diagnosis not present

## 2016-11-10 DIAGNOSIS — R202 Paresthesia of skin: Secondary | ICD-10-CM

## 2016-11-10 DIAGNOSIS — M25559 Pain in unspecified hip: Secondary | ICD-10-CM | POA: Diagnosis not present

## 2016-11-10 DIAGNOSIS — L299 Pruritus, unspecified: Secondary | ICD-10-CM

## 2016-11-10 DIAGNOSIS — Z6833 Body mass index (BMI) 33.0-33.9, adult: Secondary | ICD-10-CM

## 2016-11-10 DIAGNOSIS — E7439 Other disorders of intestinal carbohydrate absorption: Secondary | ICD-10-CM | POA: Diagnosis not present

## 2016-11-10 LAB — POCT GLYCOSYLATED HEMOGLOBIN (HGB A1C): HEMOGLOBIN A1C: 6.2

## 2016-11-10 NOTE — Patient Instructions (Addendum)
I believe the dkin darkening under your breasts is from low-grade inflammation from moisture with irritation of the skin.  Consider using daily an over the control powder to control the moisture under the Breasts,  I like Zeasorb, but any powder that controls moisture will do.  I suspect the itching on your left breast is related to moisture as well.  See if using powder to keep the entire breast dry will help decrease the itching.  You can use over the counter cortisone dream daily for a couple weeks to speed up the resolution of the itching.  If you need to be seen qu If a red, raised area develops on your breast, let Dr Laterrica Libman know, as you may need a biopsy to see if it represents inflammatory breast disease.   I believe that you may have some osteoarthritis developing in the joint where the two sides of the pelvic bones, pubic symphysis, come together.  Try acetaminophen or ibuprofen.  I beleiive your finger numbness in your right hand is likely carpel tunnel syndrome.  Consider using a wrist support in from of your keyboard to rest your wrists on when typing.  Another option is to get a wrist support brace from drug store that has a metal band built into it that supports the underside of the wrist.  Use it when you are typing if you are having discomfort with typing.   If you need to be seen quicker than Dr Tajana Crotteau can see you, you may want to be seen by one of our faculty physicians, like Dr Chrisandra Netters or  Dr Domenic Schwab.   It is time to get an eye exam from a optometrist to screen for glaucoma and time to think about getting screened for colon cancer.  We can discuss your screening options for cancer at our next visit.   Weight watchers app.

## 2016-11-11 ENCOUNTER — Encounter: Payer: Self-pay | Admitting: Family Medicine

## 2016-11-11 DIAGNOSIS — E66811 Obesity, class 1: Secondary | ICD-10-CM | POA: Insufficient documentation

## 2016-11-11 DIAGNOSIS — M25559 Pain in unspecified hip: Secondary | ICD-10-CM | POA: Insufficient documentation

## 2016-11-11 DIAGNOSIS — E669 Obesity, unspecified: Secondary | ICD-10-CM | POA: Insufficient documentation

## 2016-11-11 DIAGNOSIS — L299 Pruritus, unspecified: Secondary | ICD-10-CM | POA: Insufficient documentation

## 2016-11-11 NOTE — Assessment & Plan Note (Signed)
Improved but still not at goal. After discussion of pharmacologic and nonpharmacologic mngt HTN, Ms Walthall chose to work on weight loss and lifestyle modifications. She is considering starting Weight Watchers. Will see her back in 6 weeks to see how her efforts are fairing. Meanwhile, continue Cardizem CD 240 mg daily and HCTZ 25 mg daily.

## 2016-11-11 NOTE — Assessment & Plan Note (Signed)
Working diagnosis is carpel tunnel syndrome in right hand/wrist.  Recommend wrist support during typing and cock-up wrist support with work.  Recheck on next visit in 6 weeks.

## 2016-11-11 NOTE — Progress Notes (Signed)
   Subjective:    Patient ID: Melanie Cole, female    DOB: 1965/09/04, 51 y.o.   MRN: [id number] ARRIELLE MCGINN is alone Sources of clinical information for visit is/are patient and past medical records. Nursing assessment for this office visit was reviewed with the patient for accuracy and revision.  Chaperoned by Newton Pigg during exam. HPI  Problem List Items Addressed This Visit      Medium   Essential hypertension, benign (Chronic) CHRONIC HYPERTENSION  Disease Monitoring  Blood pressure range: SBP 130-140, DB 85-95  Chest pain: no   Dyspnea: no   Claudication: no   Medication compliance: yes, taking HCTZ and Diltiazem CD 240 mg  Medication Side Effects  Lightheadedness: no   Urinary frequency: no   Edema: no    Preventitive Healthcare:  Exercise: yes, several times a day.  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 - Prior peripheral neuropathy workup was normal - symptom has lateralized to right hand, 3rd and 4th digit tingling. - no weakeess in hand.  Not worse at particular times. - works typing on [employer] much of day.     Other Visit Diagnoses    Glucose intolerance    -  Primary   Relevant Orders   HgB A1c (Completed)     Pelvic Pain Onset: month ago Location: just above midline pelvis anteriorly Quality: sharp, brief and a longer acting ache Severity: mild Function: no impairment Pattern: intermittent Course: stable Radiation: no Relief: nothing Precipitant: nothing Associated Symptoms: no dyusuria, urgency, frequency Trauma (Acute or Chronic): none Prior Diagnostic Testing or Treatments: none Relevant PMH/PSH: none    Breast itching - onset in last couple months - located upper quadrants of left breast - no change in skin of breat of areola/nipple.  - no nipple discharge or drainage.   SH: No smoking Review of Systems See hpi    Objective:   Physical Exam VS reviewed GEN: Alert, Cooperative,  Groomed, NAD COR: RRR, No M/G/R, No JVD, Normal PMI size and location LUNGS: BCTA, No Acc mm use, speaking in full sentences Breast: no palpable masses, no thickening of skin, no rash or erythema, no axiallary LAN nor [place] LAN bilaterally.  No expressible nipple/areola discharge ABDOMEN: (+)BS, soft, NT, ND, No HSM, No palpable masses TTP on mons pubis.  No edema or erythem.  No pain with resisted flexion adduction of thighs.  EXT: No peripheral leg edema. Feet without deformity or lesions. Palpable bilateral pedal pulses. Negative phalen and negative tinel.  Gait: Normal speed, No significant path deviation, Step through +,  Psych: Normal affect/thought/speech/language    Assessment & Plan:

## 2016-11-11 NOTE — Assessment & Plan Note (Signed)
Nonspecific itching of left upper breast without rash. Recommend keeping breast skin dry with powder, e.g., Zeasorb. May use OTC cortisone  Watch for skin changes

## 2016-11-11 NOTE — Assessment & Plan Note (Signed)
Lab Results  Component Value Date   HGBA1C 6.2 11/10/2016  Rising A1c. Lifestyle intervention planned.

## 2016-11-11 NOTE — Assessment & Plan Note (Signed)
Working explanation is arthritic changes in pubic symphisis. NSAIDS PRN

## 2016-12-22 ENCOUNTER — Encounter: Payer: Self-pay | Admitting: Family Medicine

## 2016-12-22 ENCOUNTER — Ambulatory Visit (INDEPENDENT_AMBULATORY_CARE_PROVIDER_SITE_OTHER): Payer: 59 | Admitting: Family Medicine

## 2016-12-22 VITALS — BP 120/78 | HR 71 | Temp 98.3°F | Ht 64.0 in | Wt 191.0 lb

## 2016-12-22 DIAGNOSIS — Z9189 Other specified personal risk factors, not elsewhere classified: Secondary | ICD-10-CM

## 2016-12-22 DIAGNOSIS — I10 Essential (primary) hypertension: Secondary | ICD-10-CM | POA: Diagnosis not present

## 2016-12-22 DIAGNOSIS — E669 Obesity, unspecified: Secondary | ICD-10-CM

## 2016-12-22 DIAGNOSIS — Z6833 Body mass index (BMI) 33.0-33.9, adult: Secondary | ICD-10-CM | POA: Diagnosis not present

## 2016-12-22 DIAGNOSIS — Z1212 Encounter for screening for malignant neoplasm of rectum: Secondary | ICD-10-CM

## 2016-12-22 DIAGNOSIS — Z1211 Encounter for screening for malignant neoplasm of colon: Secondary | ICD-10-CM

## 2016-12-22 DIAGNOSIS — L299 Pruritus, unspecified: Secondary | ICD-10-CM | POA: Diagnosis not present

## 2016-12-22 DIAGNOSIS — M25559 Pain in unspecified hip: Secondary | ICD-10-CM | POA: Diagnosis not present

## 2016-12-22 NOTE — Progress Notes (Signed)
   Subjective:    Patient ID: Melanie Cole, female    DOB: 07/15/1965, 51 y.o.   MRN: [id number]  HPI CHRONIC HYPERTENSION  Disease Monitoring  Blood pressure range: recent 145/90  Chest pain: no   Dyspnea: no   Claudication: no   Medication compliance: yes, Diltiazem XT 240 mg daily and HCTZ 25 Medication Side Effects  Lightheadedness: yes, occasionally   Urinary frequency: no   Edema: mild at end of day in both feet  Salt Restriction: No shaken salt.   Obesity Patient complains of obesity. Patient cites health as reasons for wanting to lose weight. Began using Weight Watchers app felt there was inadequate improvement after three weeks so stopped using it. Is going to gym now several times a week for aerobic activities.  Has been successful in weight loss in past with caloric restriction  Breast Itching - Improved with dessicant powder. - No further concerns.  Pelvic pain - thought to be pubic symphisis. - OTC NSAIDS helped.  No further pain.  SH: No smoking Review of Systems See HPI    Objective:   Physical Exam Vitals:   12/22/16 0859  BP: 120/78  Pulse: 71  Temp: 98.3 F (36.8 C)  SpO2: 99%   Wt Readings from Last 3 Encounters:  12/22/16 191 lb (86.6 kg)  11/10/16 193 lb (87.5 kg)  12/10/15 190 lb (86.2 kg)  Gen: NAD, groomed Cor: RRR, no M/G/R Lung: BCTA, no acc mm use Ext: no edema     Assessment & Plan:  See problem list

## 2016-12-22 NOTE — Progress Notes (Signed)
Patient declined flu vaccine here in the office because she plans on getting it with her employer through [facility]. Denys Salinger,[name]

## 2016-12-22 NOTE — Assessment & Plan Note (Signed)
Established problem that has improved.  Continue current NDHP CCB and thiazide therapy.  Continue weight loss work.

## 2016-12-22 NOTE — Assessment & Plan Note (Signed)
Established problem that has improved.  Down 2 pounds in about little over 5 weeks using Weight Watcher app. Melanie Cole feels discouraged with this amount of weight loss. She is interested in participating in a physician supervised weight management plan.  Referral for medical weight management at [facility], Dr Leafy Ro. Melanie Cole is going to see if her insurance will help cover some of the cost of involvement in this program.  See patient back in 3 months to follow up HTN and wieght.

## 2016-12-22 NOTE — Patient Instructions (Signed)
Your blood pressure looks really good.  I am not sure how much is from your two pounds of weight loss, but lets keep with the current medications and plan to work on further weight loss.  We made a referral to [facility] with Dennard Nip, MD.  Please look into whether your insurance with cover your consultation for Medical Nutritional Therapy for your hypertension and obesity.  Dr Maizie Garno will arrange your Colonosocopy in [place], and will try to get it with your prior gastroenterologist for Novemenber 16th.

## 2016-12-22 NOTE — Assessment & Plan Note (Signed)
Established problem that has improved.  Resolved.

## 2016-12-22 NOTE — Assessment & Plan Note (Signed)
Established problem that has improved.  Resolved

## 2016-12-30 ENCOUNTER — Telehealth: Payer: Self-pay | Admitting: *Deleted

## 2016-12-30 NOTE — Telephone Encounter (Signed)
Pt contacted and informed of form completion and fax. Pt was very appreciative.

## 2016-12-30 NOTE — Telephone Encounter (Signed)
Patient called stating her insurance company called late afternoon yesterday. They need a form completed and returned by Sunday 01/01/17.  She will fax the form over to attn: Dr. McDiarmid and Team of CMAs.  Please give her a call when completed.  Number on file is correct.  Derl Barrow, RN

## 2016-12-30 NOTE — Telephone Encounter (Signed)
Please let Ms Ketter know that form has been completed and fax'd.

## 2017-01-05 ENCOUNTER — Encounter: Payer: Self-pay | Admitting: Family Medicine

## 2017-03-29 ENCOUNTER — Other Ambulatory Visit: Payer: Self-pay | Admitting: Family Medicine

## 2017-03-29 DIAGNOSIS — I10 Essential (primary) hypertension: Secondary | ICD-10-CM

## 2017-03-29 MED ORDER — DILTIAZEM HCL ER COATED BEADS 240 MG PO CP24: 240.0000 mg | ORAL_CAPSULE | Freq: Every day | ORAL | 99 refills | Status: DC

## 2017-06-22 ENCOUNTER — Encounter (INDEPENDENT_AMBULATORY_CARE_PROVIDER_SITE_OTHER): Payer: 59

## 2017-06-27 ENCOUNTER — Ambulatory Visit (INDEPENDENT_AMBULATORY_CARE_PROVIDER_SITE_OTHER): Payer: 59 | Admitting: Family Medicine

## 2017-06-27 ENCOUNTER — Encounter (INDEPENDENT_AMBULATORY_CARE_PROVIDER_SITE_OTHER): Payer: Self-pay

## 2017-07-07 ENCOUNTER — Ambulatory Visit
Admission: RE | Admit: 2017-07-07 | Discharge: 2017-07-07 | Disposition: A | Discharge status: Home / Self Care | Payer: 59 | Source: Ambulatory Visit | Attending: Family Medicine | Admitting: Family Medicine

## 2017-07-07 ENCOUNTER — Other Ambulatory Visit: Payer: Self-pay | Admitting: Family Medicine

## 2017-07-07 DIAGNOSIS — Z139 Encounter for screening, unspecified: Secondary | ICD-10-CM

## 2017-09-21 ENCOUNTER — Other Ambulatory Visit: Payer: Self-pay | Admitting: Obstetrics and Gynecology

## 2017-09-21 ENCOUNTER — Encounter (INDEPENDENT_AMBULATORY_CARE_PROVIDER_SITE_OTHER): Payer: 59

## 2017-10-17 ENCOUNTER — Ambulatory Visit (INDEPENDENT_AMBULATORY_CARE_PROVIDER_SITE_OTHER): Payer: 59 | Admitting: Family Medicine

## 2017-10-20 ENCOUNTER — Other Ambulatory Visit: Payer: Self-pay | Admitting: Cardiology

## 2017-10-20 DIAGNOSIS — R079 Chest pain, unspecified: Secondary | ICD-10-CM

## 2017-10-27 ENCOUNTER — Encounter: Payer: Self-pay | Admitting: Family Medicine

## 2017-10-27 ENCOUNTER — Encounter (HOSPITAL_COMMUNITY)
Admission: RE | Admit: 2017-10-27 | Discharge: 2017-10-27 | Disposition: A | Discharge status: Home / Self Care | Payer: 59 | Source: Ambulatory Visit | Attending: Cardiology | Admitting: Cardiology

## 2017-10-27 ENCOUNTER — Encounter (HOSPITAL_COMMUNITY): Payer: Self-pay

## 2017-10-27 DIAGNOSIS — R079 Chest pain, unspecified: Secondary | ICD-10-CM | POA: Diagnosis not present

## 2017-10-27 LAB — LIPID PANEL
CHOLESTEROL: 246 mg/dL — AB (ref 0–200)
HDL: 51 mg/dL (ref >40)
LDL Cholesterol: 180 mg/dL — ABNORMAL HIGH (ref 0–99)
TRIGLYCERIDES: 75 mg/dL (ref <150)
Total CHOL/HDL Ratio: 4.8 RATIO
VLDL: 15 mg/dL (ref 0–40)

## 2017-10-27 MED ORDER — TECHNETIUM TC 99M TETROFOSMIN IV KIT
30.0000 | PACK | Freq: Once | INTRAVENOUS | Status: AC | PRN
  Administered 2017-10-27: 30 via INTRAVENOUS

## 2017-10-27 MED ORDER — TECHNETIUM TC 99M TETROFOSMIN IV KIT
10.0000 | PACK | Freq: Once | INTRAVENOUS | Status: AC | PRN
  Administered 2017-10-27: 10 via INTRAVENOUS

## 2017-10-27 NOTE — Progress Notes (Signed)
Initial ov with Dr Tressia Danas (GI) A/ Colon cancer screening Chronic idiopathic constipation Abdominal bloating GERD Chest pain, unspecified Obesity  P/  Screening colonoscopy Samples of Linzess 145 mcg and 290 mg: pt to let doctor know which dose best Ultra Flora Start omeprazole 40 mg Referral to Dr Terrence Dupont for cardiac eval

## 2017-12-06 ENCOUNTER — Encounter (INDEPENDENT_AMBULATORY_CARE_PROVIDER_SITE_OTHER): Payer: 59

## 2017-12-11 ENCOUNTER — Other Ambulatory Visit: Payer: Self-pay | Admitting: Family Medicine

## 2017-12-18 ENCOUNTER — Encounter (INDEPENDENT_AMBULATORY_CARE_PROVIDER_SITE_OTHER): Payer: Self-pay | Admitting: Family Medicine

## 2017-12-18 ENCOUNTER — Ambulatory Visit (INDEPENDENT_AMBULATORY_CARE_PROVIDER_SITE_OTHER): Payer: 59 | Admitting: Family Medicine

## 2017-12-18 VITALS — BP 117/79 | HR 67 | Temp 97.8°F | Ht 64.0 in | Wt 194.0 lb

## 2017-12-18 DIAGNOSIS — Z0289 Encounter for other administrative examinations: Secondary | ICD-10-CM

## 2017-12-18 DIAGNOSIS — R0602 Shortness of breath: Secondary | ICD-10-CM

## 2017-12-18 DIAGNOSIS — E669 Obesity, unspecified: Secondary | ICD-10-CM

## 2017-12-18 DIAGNOSIS — Z1331 Encounter for screening for depression: Secondary | ICD-10-CM | POA: Diagnosis not present

## 2017-12-18 DIAGNOSIS — R7303 Prediabetes: Secondary | ICD-10-CM

## 2017-12-18 DIAGNOSIS — R5383 Other fatigue: Secondary | ICD-10-CM | POA: Diagnosis not present

## 2017-12-18 DIAGNOSIS — I10 Essential (primary) hypertension: Secondary | ICD-10-CM | POA: Diagnosis not present

## 2017-12-18 DIAGNOSIS — Z9189 Other specified personal risk factors, not elsewhere classified: Secondary | ICD-10-CM

## 2017-12-18 DIAGNOSIS — Z6833 Body mass index (BMI) 33.0-33.9, adult: Secondary | ICD-10-CM

## 2017-12-19 LAB — COMPREHENSIVE METABOLIC PANEL
ALBUMIN: 4.6 g/dL (ref 3.5–5.5)
ALT: 20 IU/L (ref 0–32)
AST: 21 IU/L (ref 0–40)
Albumin/Globulin Ratio: 1.6 (ref 1.2–2.2)
Alkaline Phosphatase: 74 IU/L (ref 39–117)
BUN / CREAT RATIO: 24 — AB (ref 9–23)
BUN: 15 mg/dL (ref 6–24)
Bilirubin Total: 0.7 mg/dL (ref 0.0–1.2)
CALCIUM: 9.5 mg/dL (ref 8.7–10.2)
CO2: 25 mmol/L (ref 20–29)
CREATININE: 0.63 mg/dL (ref 0.57–1.00)
Chloride: 100 mmol/L (ref 96–106)
GFR calc Af Amer: 120 mL/min/{1.73_m2} (ref >59)
GFR, EST NON AFRICAN AMERICAN: 104 mL/min/{1.73_m2} (ref >59)
GLOBULIN, TOTAL: 2.8 g/dL (ref 1.5–4.5)
Glucose: 88 mg/dL (ref 65–99)
Potassium: 4 mmol/L (ref 3.5–5.2)
SODIUM: 142 mmol/L (ref 134–144)
Total Protein: 7.4 g/dL (ref 6.0–8.5)

## 2017-12-19 LAB — HEMOGLOBIN A1C
Est. average glucose Bld gHb Est-mCnc: 137 mg/dL
Hgb A1c MFr Bld: 6.4 % — ABNORMAL HIGH (ref 4.8–5.6)

## 2017-12-19 LAB — T4, FREE: Free T4: 0.98 ng/dL (ref 0.82–1.77)

## 2017-12-19 LAB — VITAMIN D 25 HYDROXY (VIT D DEFICIENCY, FRACTURES): Vit D, 25-Hydroxy: 15.3 ng/mL — ABNORMAL LOW (ref 30.0–100.0)

## 2017-12-19 LAB — LIPID PANEL WITH LDL/HDL RATIO
CHOLESTEROL TOTAL: 262 mg/dL — AB (ref 100–199)
HDL: 68 mg/dL (ref >39)
LDL Calculated: 175 mg/dL — ABNORMAL HIGH (ref 0–99)
LDl/HDL Ratio: 2.6 ratio (ref 0.0–3.2)
TRIGLYCERIDES: 94 mg/dL (ref 0–149)
VLDL Cholesterol Cal: 19 mg/dL (ref 5–40)

## 2017-12-19 LAB — CBC WITH DIFFERENTIAL
BASOS: 1 %
Basophils Absolute: 0 10*3/uL (ref 0.0–0.2)
EOS (ABSOLUTE): 0.1 10*3/uL (ref 0.0–0.4)
EOS: 2 %
HEMATOCRIT: 43.1 % (ref 34.0–46.6)
Hemoglobin: 14.3 g/dL (ref 11.1–15.9)
IMMATURE GRANS (ABS): 0 10*3/uL (ref 0.0–0.1)
Immature Granulocytes: 0 %
LYMPHS: 45 %
Lymphocytes Absolute: 2.8 10*3/uL (ref 0.7–3.1)
MCH: 30.2 pg (ref 26.6–33.0)
MCHC: 33.2 g/dL (ref 31.5–35.7)
MCV: 91 fL (ref 79–97)
MONOS ABS: 0.4 10*3/uL (ref 0.1–0.9)
Monocytes: 6 %
NEUTROS ABS: 2.8 10*3/uL (ref 1.4–7.0)
NEUTROS PCT: 46 %
RBC: 4.74 x10E6/uL (ref 3.77–5.28)
RDW: 13 % (ref 12.3–15.4)
WBC: 6.1 10*3/uL (ref 3.4–10.8)

## 2017-12-19 LAB — INSULIN, RANDOM: INSULIN: 11.7 u[IU]/mL (ref 2.6–24.9)

## 2017-12-19 LAB — TSH: TSH: 0.554 u[IU]/mL (ref 0.450–4.500)

## 2017-12-19 LAB — T3: T3 TOTAL: 117 ng/dL (ref 71–180)

## 2017-12-19 LAB — VITAMIN B12: Vitamin B-12: 519 pg/mL (ref 232–1245)

## 2017-12-19 LAB — FOLATE: FOLATE: 14.3 ng/mL (ref >3.0)

## 2017-12-19 NOTE — Progress Notes (Signed)
Office: [phone]  /  Fax: [phone]   Dear Dr. McDiarmid,   Thank you for referring Melanie Cole to our clinic. The following note includes my evaluation and treatment recommendations.  HPI:   Chief Complaint: OBESITY    DRISHTI PEPPERMAN has been referred by Blane Ohara McDiarmid, MD for consultation regarding her obesity and obesity related comorbidities.    SHALANA JARDIN (MR# [id number]) is a 52 y.o. female who presents on 12/18/2017 for obesity evaluation and treatment. Current BMI is Body mass index is 33.3 kg/m.[name] Tawnia has been struggling with her weight for many years and has been unsuccessful in either losing weight, maintaining weight loss, or reaching her healthy weight goal.     Graysen attended our information session and states she is currently in the action stage of change and ready to dedicate time achieving and maintaining a healthier weight. Remy is interested in becoming our patient and working on intensive lifestyle modifications including (but not limited to) diet, exercise and weight loss.    Emerson states her family eats meals together she struggles with family and or coworkers weight loss sabotage her desired weight loss is 49 lbs she started gaining weight approaching menopause her heaviest weight ever was 194 lbs she has significant food cravings issues  she snacks frequently in the evenings she is frequently drinking liquids with calories she frequently makes poor food choices she frequently eats larger portions than normal  she struggles with emotional eating    Fatigue Chris feels her energy is lower than it should be. This has worsened with weight gain and has not worsened recently. Makya admits to daytime somnolence and  admits to waking up still tired. Patient is at risk for obstructive sleep apnea. Patent has a history of symptoms of daytime fatigue. Patient generally gets 7 hours of sleep per night, and states they generally have nightime  awakenings. Snoring is present. Apneic episodes are not present. Epworth Sleepiness Score is 17.  Dyspnea on exertion Krystle notes increasing shortness of breath with exercising and seems to be worsening over time with weight gain. She notes getting out of breath sooner with activity than she used to. This has not gotten worse recently. Amanii denies orthopnea.  Pre-Diabetes Damon has a diagnosis of pre-diabetes based on her elevated Hgb A1c and was informed this puts her at greater risk of developing diabetes. She has been diagnosed with pre-diabetes for at least 4 years. She is not taking metformin currently and continues to work on diet and exercise to decrease risk of diabetes. She denies nausea or hypoglycemia.  At risk for diabetes Maclovia is at higher than average risk for developing diabetes due to her obesity and pre-diabetes. She currently denies polyuria or polydipsia.  Hypertension JASSMIN KEMMERER is a 52 y.o. female with hypertension. Bryna's blood pressure is controlled today. She denies chest pain, chest pressure, or headache. She is working weight loss to help control her blood pressure with the goal of decreasing her risk of heart attack and stroke.   Depression Screen Saori's Food and Mood (modified PHQ-9) score was  Depression screen PHQ 2/9 12/18/2017  Decreased Interest 3  Down, Depressed, Hopeless 3  PHQ - 2 Score 6  Altered sleeping 3  Tired, decreased energy 3  Change in appetite 2  Feeling bad or failure about yourself  2  Trouble concentrating 2  Moving slowly or fidgety/restless 0  Suicidal thoughts 0  PHQ-9 Score 18  Difficult doing work/chores Not difficult  at all    ALLERGIES: Allergies  Allergen Reactions  . Ace Inhibitors     REACTION: Atopic rash  . Amlodipine Besylate Other (See Comments)    Peripheral edema and dry mouth.     MEDICATIONS: Current Outpatient Medications on File Prior to Visit  Medication Sig Dispense Refill  . diltiazem  (CARTIA XT) 240 MG 24 hr capsule Take 1 capsule (240 mg total) by mouth daily. 30 capsule PRN  . diphenhydrAMINE (BENADRYL) 25 MG tablet Take 25 mg by mouth every 6 (six) hours as needed.    . hydrochlorothiazide (HYDRODIURIL) 25 MG tablet TAKE 1 TABLET BY MOUTH ONCE DAILY 90 tablet 3  . linaclotide (LINZESS) 145 MCG CAPS capsule Take 145 mcg by mouth daily before breakfast.    . MELATONIN ER PO Take 1 capsule by mouth daily.    [name] omeprazole (PRILOSEC) 40 MG capsule Take 40 mg by mouth daily.     No current facility-administered medications on file prior to visit.     PAST MEDICAL HISTORY: Past Medical History:  Diagnosis Date  . Adenomyosis   . Atopic dermatitis 12/18/2012  . Chest pain, unspecified 12/16/2013  . Constipation   . Essential hypertension, benign   . GERD (gastroesophageal reflux disease)   . Hemangioma of liver 09/10/2010   09/09/10 Abdominopelvic CT found incidental 1.8 cm round mass in the left lobe of the liver which is likely a solitary cavernous hemangioma    . High blood pressure   . High cholesterol   . History of gestational diabetes   . History of postmenopausal bleeding 12/16/2013  . History of pulmonary function tests 2002   Normal values.  . Hypertension   . IBS (irritable bowel syndrome)   . Lesion of liver 02/13/2015  . Liver problem   . MIGRAINE, UNSPEC., W/O INTRACTABLE MIGRAINE 06/01/2006       . Mondor's disease   . Patellar tendinitis/bursitis 12/18/2012  . Postmenopause   . Pre-diabetes   . Urinary, incontinence, stress female 04/08/2014  . Uterus, adenomyosis    Per patient's recall  . Vasomotor flushing 12/16/2013  . Vitamin D deficiency     PAST SURGICAL HISTORY: Past Surgical History:  Procedure Laterality Date  . Bladder "Stretched"    . CARDIOVASCULAR STRESS TEST  12/03/2000   Stress Echocargiogram - 12/03/2000, NORMAL  . COLONOSCOPY  05/05/2000    Dr Lyndel Safe Tia Alert)- Internal Hemorrhoids  . SPIROMETRY  12/03/2000   Normal Pulmonary  Function Testing   . TUBAL LIGATION      SOCIAL HISTORY: Social History   Tobacco Use  . Smoking status: Never Smoker  . Smokeless tobacco: Never Used  Substance Use Topics  . Alcohol use: No  . Drug use: No    FAMILY HISTORY: Family History  Problem Relation Age of Onset  . Pancreatic cancer Father        Died age 51  . Arrhythmia Father        Required a permanent pacemaker  . Diabetes Father   . Colon cancer Cousin        Colorectal Cancer  . Colon cancer Unknown        Aunt with colon cancer  . Diabetes Mother   . Thyroid disease Mother   . High blood pressure Mother   . High Cholesterol Mother   . Cancer Unknown        Pancreas, unknown relative    ROS: Review of Systems  Constitutional: Positive for chills, fever and malaise/fatigue.  Negative for weight loss.       + Trouble sleeping  Eyes:       + Vision changes  Respiratory: Positive for shortness of breath.   Cardiovascular: Negative for chest pain and orthopnea.       Negative chest pressure  Gastrointestinal: Positive for constipation and heartburn. Negative for nausea.  Genitourinary: Negative for frequency.  Skin: Positive for itching.       + Dryness  Neurological: Negative for headaches.  Endo/Heme/Allergies: Negative for polydipsia. Bruises/bleeds easily.       Negative hypoglycemia    PHYSICAL EXAM: Blood pressure 117/79, pulse 67, temperature 97.8 F (36.6 C), temperature source Oral, height 5\' 4"  (1.626 m), weight 194 lb (88 kg), last menstrual period 11/16/2012, SpO2 99 %. Body mass index is 33.3 kg/m. Physical Exam  Constitutional: She is oriented to person, place, and time. She appears well-developed and well-nourished.  HENT:  Head: Normocephalic and atraumatic.  Nose: Nose normal.  Eyes: EOM are normal. No scleral icterus.  Neck: Normal range of motion. Neck supple. No thyromegaly present.  Cardiovascular: Normal rate and regular rhythm.  Pulmonary/Chest: Effort normal. No  respiratory distress.  Abdominal: Soft. There is no tenderness.  + Obesity  Musculoskeletal:  Range of Motion normal in all 4 extremities Trace edema noted in bilateral lower extremities  Neurological: She is alert and oriented to person, place, and time. Coordination normal.  Skin: Skin is warm and dry.  Psychiatric: She has a normal mood and affect. Her behavior is normal.  Vitals reviewed.   RECENT LABS AND TESTS: BMET    Component Value Date/Time   NA 142 12/18/2017 1245   K 4.0 12/18/2017 1245   CL 100 12/18/2017 1245   CO2 25 12/18/2017 1245   GLUCOSE 88 12/18/2017 1245   GLUCOSE 90 12/10/2015 0951   BUN 15 12/18/2017 1245   CREATININE 0.63 12/18/2017 1245   CREATININE 0.60 12/10/2015 0951   CALCIUM 9.5 12/18/2017 1245   GFRNONAA 104 12/18/2017 1245   GFRNONAA >89 12/10/2015 0951   GFRAA 120 12/18/2017 1245   GFRAA >89 12/10/2015 0951   Lab Results  Component Value Date   HGBA1C 6.4 (H) 12/18/2017   Lab Results  Component Value Date   INSULIN 11.7 12/18/2017   CBC    Component Value Date/Time   WBC 6.1 12/18/2017 1245   WBC 5.5 12/10/2015 0951   RBC 4.74 12/18/2017 1245   RBC 4.95 12/10/2015 0951   HGB 14.3 12/18/2017 1245   HCT 43.1 12/18/2017 1245   PLT 326 12/10/2015 0951   MCV 91 12/18/2017 1245   MCH 30.2 12/18/2017 1245   MCH 31.1 12/10/2015 0951   MCHC 33.2 12/18/2017 1245   MCHC 34.4 12/10/2015 0951   RDW 13.0 12/18/2017 1245   LYMPHSABS 2.8 12/18/2017 1245   MONOABS 330 12/10/2015 0951   EOSABS 0.1 12/18/2017 1245   BASOSABS 0.0 12/18/2017 1245   Iron/TIBC/Ferritin/ %Sat No results found for: IRON, TIBC, FERRITIN, IRONPCTSAT Lipid Panel     Component Value Date/Time   CHOL 262 (H) 12/18/2017 1245   TRIG 94 12/18/2017 1245   HDL 68 12/18/2017 1245   CHOLHDL 4.8 10/27/2017 1020   VLDL 15 10/27/2017 1020   LDLCALC 175 (H) 12/18/2017 1245   Hepatic Function Panel     Component Value Date/Time   PROT 7.4 12/18/2017 1245   ALBUMIN  4.6 12/18/2017 1245   AST 21 12/18/2017 1245   ALT 20 12/18/2017 1245   ALKPHOS 74  12/18/2017 1245   BILITOT 0.7 12/18/2017 1245   BILIDIR 0.2 02/22/2008      Component Value Date/Time   TSH 0.554 12/18/2017 1245   TSH 0.71 12/10/2015 0951   TSH 0.717 03/05/2012 1700    ECG  shows NSR with a rate of 71 BPM INDIRECT CALORIMETER done today shows a VO2 of 272 and a REE of 1897.  Her calculated basal metabolic rate is 7253 thus her basal metabolic rate is better than expected.    ASSESSMENT AND PLAN: Other fatigue - Plan: EKG 12-Lead, VITAMIN D 25 Hydroxy (Vit-D Deficiency, Fractures), Vitamin B12, Folate, T3, T4, free, TSH  Shortness of breath on exertion  Prediabetes - Plan: Hemoglobin A1c, Insulin, random  Essential hypertension - Plan: Comprehensive metabolic panel, CBC With Differential, Lipid Panel With LDL/HDL Ratio  Depression screening  At risk for diabetes mellitus  Class 1 obesity with serious comorbidity and body mass index (BMI) of 33.0 to 33.9 in adult, unspecified obesity type  PLAN:  Fatigue Jerita was informed that her fatigue may be related to obesity, depression or many other causes. Labs will be ordered, and in the meanwhile Hue has agreed to work on diet, exercise and weight loss to help with fatigue. Proper sleep hygiene was discussed including the need for 7-8 hours of quality sleep each night. A sleep study was not ordered based on symptoms and Epworth score.  Dyspnea on exertion Devika's shortness of breath appears to be obesity related and exercise induced. She has agreed to work on weight loss and gradually increase exercise to treat her exercise induced shortness of breath. If Miyana follows our instructions and loses weight without improvement of her shortness of breath, we will plan to refer to pulmonology. We will monitor this condition regularly. Loriann agrees to this plan.  Pre-Diabetes Kodi will continue to work on weight loss, exercise,  and decreasing simple carbohydrates in her diet to help decrease the risk of diabetes. We dicussed metformin including benefits and risks. She was informed that eating too many simple carbohydrates or too many calories at one sitting increases the likelihood of GI side effects. Dedria declined metformin for now and a prescription was not written today. We will check labs today and Leinaala agrees to follow up with our clinic in 2 weeks as directed to monitor her progress.  Diabetes risk counselling Renate was given extended (15 minutes) diabetes prevention counseling today. She is 52 y.o. female and has risk factors for diabetes including obesity and pre-diabetes. We discussed intensive lifestyle modifications today with an emphasis on weight loss as well as increasing exercise and decreasing simple carbohydrates in her diet.  Hypertension We discussed sodium restriction, working on healthy weight loss, and a regular exercise program as the means to achieve improved blood pressure control. Vantasia agreed with this plan and agreed to follow up as directed. We will continue to monitor her blood pressure as well as her progress with the above lifestyle modifications. Thi will continue her medications as prescribed and will watch for signs of hypotension as she continues her lifestyle modifications. We will check labs today, and follow up on blood pressure at next appointment. Laiah agrees to follow up with our clinic in 2 weeks.  Depression Screen Yi had a strongly positive depression screening. Depression is commonly associated with obesity and often results in emotional eating behaviors. We will monitor this closely and work on CBT to help improve the non-hunger eating patterns. Referral to Psychology may be required if no  improvement is seen as she continues in our clinic.  Obesity Emmilynn is currently in the action stage of change and her goal is to continue with weight loss efforts. I recommend  Selia begin the structured treatment plan as follows:  She has agreed to follow the Category 3 plan Alisah has been instructed to eventually work up to a goal of 150 minutes of combined cardio and strengthening exercise per week for weight loss and overall health benefits. We discussed the following Behavioral Modification Strategies today: increasing lean protein intake, increasing vegetables, work on meal planning and easy cooking plans, and planning for success   She was informed of the importance of frequent follow up visits to maximize her success with intensive lifestyle modifications for her multiple health conditions. She was informed we would discuss her lab results at her next visit unless there is a critical issue that needs to be addressed sooner. Myrle agreed to keep her next visit at the agreed upon time to discuss these results.    OBESITY BEHAVIORAL INTERVENTION VISIT  Today's visit was # 1   Starting weight: 194 lbs Starting date: 12/18/17 Today's weight : 194 lbs  Today's date: 12/18/2017 Total lbs lost to date: 0    ASK: We discussed the diagnosis of obesity with Melanie Cole today and Flara agreed to give [place] permission to discuss obesity behavioral modification therapy today.  ASSESS: Bernetha has the diagnosis of obesity and her BMI today is 33.28 Ahtziry is in the action stage of change   ADVISE: Marieclaire was educated on the multiple health risks of obesity as well as the benefit of weight loss to improve her health. She was advised of the need for long term treatment and the importance of lifestyle modifications to improve her current health and to decrease her risk of future health problems.  AGREE: Multiple dietary modification options and treatment options were discussed and  Aylssa agreed to follow the recommendations documented in the above note.  ARRANGE: Raeshawn was educated on the importance of frequent visits to treat obesity as outlined per CMS and  USPSTF guidelines and agreed to schedule her next follow up appointment today.  I, Trixie Dredge, am acting as transcriptionist for Ilene Qua, MD  I have reviewed the above documentation for accuracy and completeness, and I agree with the above. - Ilene Qua, MD

## 2018-01-01 ENCOUNTER — Ambulatory Visit (INDEPENDENT_AMBULATORY_CARE_PROVIDER_SITE_OTHER): Payer: 59 | Admitting: Family Medicine

## 2018-01-01 VITALS — BP 140/85 | HR 73 | Temp 98.3°F | Ht 64.0 in | Wt 194.0 lb

## 2018-01-01 DIAGNOSIS — R7303 Prediabetes: Secondary | ICD-10-CM

## 2018-01-01 DIAGNOSIS — E559 Vitamin D deficiency, unspecified: Secondary | ICD-10-CM | POA: Diagnosis not present

## 2018-01-01 DIAGNOSIS — E66811 Obesity, class 1: Secondary | ICD-10-CM

## 2018-01-01 DIAGNOSIS — E7849 Other hyperlipidemia: Secondary | ICD-10-CM | POA: Diagnosis not present

## 2018-01-01 DIAGNOSIS — Z6833 Body mass index (BMI) 33.0-33.9, adult: Secondary | ICD-10-CM

## 2018-01-01 DIAGNOSIS — Z9189 Other specified personal risk factors, not elsewhere classified: Secondary | ICD-10-CM

## 2018-01-01 DIAGNOSIS — E669 Obesity, unspecified: Secondary | ICD-10-CM

## 2018-01-01 MED ORDER — CO Q-10 300 MG PO CAPS: 1.0000 | ORAL_CAPSULE | Freq: Every day | ORAL | 0 refills | Status: DC

## 2018-01-01 MED ORDER — VITAMIN D (ERGOCALCIFEROL) 1.25 MG (50000 UNIT) PO CAPS: 50000.0000 [IU] | ORAL_CAPSULE | ORAL | 0 refills | Status: DC

## 2018-01-01 MED ORDER — METFORMIN HCL 500 MG PO TABS: 500.0000 mg | ORAL_TABLET | Freq: Every day | ORAL | 0 refills | Status: DC

## 2018-01-01 NOTE — Progress Notes (Signed)
Office: [phone]  /  Fax: [phone]   HPI:   Chief Complaint: OBESITY Melanie Cole is here to discuss her progress with her obesity treatment plan. She is on the  follow the Category 3 plan and is following her eating plan approximately 65-70 % of the time. She states she is exercising 0 minutes 0 times per week. Melanie Cole started off very well and denied hunger. She reports a birthday celebration only indulged with ice cream but later had birthday cake in the house for a bit too long.   Her weight is 194 lb (88 kg) today and has not lost weight since her last visit. She has lost 0 lbs since starting treatment with [place].  Hyperlipidemia Melanie Cole has hyperlipidemia. Her LDL has been elevated for years and has been trying to improve her cholesterol levels with intensive lifestyle modification including a low saturated fat diet, exercise and weight loss. Her cardiologist prescribed atorvastatin at last visit. She denies any chest pain, claudication or myalgias.  Vitamin D deficiency Melanie Cole has a diagnosis of vitamin D deficiency. She is not currently taking vit D and denies nausea, vomiting or muscle weakness.   Ref. Range 12/18/2017 12:45  Vitamin D, 25-Hydroxy Latest Ref Range: 30.0 - 100.0 ng/mL 15.3 (L)   Pre-Diabetes Melanie Cole has a diagnosis of prediabetes based on her elevated HgA1c and was informed this puts her at greater risk of developing diabetes. She has a history of this diagnosis for at least 4 years.  She is not taking metformin currently and continues to work on diet and exercise to decrease risk of diabetes. She denies nausea or hypoglycemia.  At risk for diabetes Melanie Cole is at higher than averagerisk for developing diabetes due to her obesity. She currently denies polyuria or polydipsia.  ALLERGIES: Allergies  Allergen Reactions  . Ace Inhibitors     REACTION: Atopic rash  . Amlodipine Besylate Other (See Comments)    Peripheral edema and dry mouth.      MEDICATIONS: Current Outpatient Medications on File Prior to Visit  Medication Sig Dispense Refill  . diltiazem (CARTIA XT) 240 MG 24 hr capsule Take 1 capsule (240 mg total) by mouth daily. 30 capsule PRN  . diphenhydrAMINE (BENADRYL) 25 MG tablet Take 25 mg by mouth every 6 (six) hours as needed.    . hydrochlorothiazide (HYDRODIURIL) 25 MG tablet TAKE 1 TABLET BY MOUTH ONCE DAILY 90 tablet 3  . linaclotide (LINZESS) 145 MCG CAPS capsule Take 145 mcg by mouth daily before breakfast.    . MELATONIN ER PO Take 1 capsule by mouth daily.    [name] omeprazole (PRILOSEC) 40 MG capsule Take 40 mg by mouth daily.     No current facility-administered medications on file prior to visit.     PAST MEDICAL HISTORY: Past Medical History:  Diagnosis Date  . Adenomyosis   . Atopic dermatitis 12/18/2012  . Chest pain, unspecified 12/16/2013  . Constipation   . Essential hypertension, benign   . GERD (gastroesophageal reflux disease)   . Hemangioma of liver 09/10/2010   09/09/10 Abdominopelvic CT found incidental 1.8 cm round mass in the left lobe of the liver which is likely a solitary cavernous hemangioma    . High blood pressure   . High cholesterol   . History of gestational diabetes   . History of postmenopausal bleeding 12/16/2013  . History of pulmonary function tests 2002   Normal values.  . Hypertension   . IBS (irritable bowel syndrome)   . Lesion of  liver 02/13/2015  . Liver problem   . MIGRAINE, UNSPEC., W/O INTRACTABLE MIGRAINE 06/01/2006       . Mondor's disease   . Patellar tendinitis/bursitis 12/18/2012  . Postmenopause   . Pre-diabetes   . Urinary, incontinence, stress female 04/08/2014  . Uterus, adenomyosis    Per patient's recall  . Vasomotor flushing 12/16/2013  . Vitamin D deficiency     PAST SURGICAL HISTORY: Past Surgical History:  Procedure Laterality Date  . Bladder "Stretched"    . CARDIOVASCULAR STRESS TEST  12/03/2000   Stress Echocargiogram - 12/03/2000, NORMAL  .  COLONOSCOPY  05/05/2000    Dr Lyndel Safe [facility])- Internal Hemorrhoids  . SPIROMETRY  12/03/2000   Normal Pulmonary Function Testing   . TUBAL LIGATION      SOCIAL HISTORY: Social History   Tobacco Use  . Smoking status: Never Smoker  . Smokeless tobacco: Never Used  Substance Use Topics  . Alcohol use: No  . Drug use: No    FAMILY HISTORY: Family History  Problem Relation Age of Onset  . Pancreatic cancer Father        Died age 60  . Arrhythmia Father        Required a permanent pacemaker  . Diabetes Father   . Colon cancer Cousin        Colorectal Cancer  . Colon cancer Unknown        Aunt with colon cancer  . Diabetes Mother   . Thyroid disease Mother   . High blood pressure Mother   . High Cholesterol Mother   . Cancer Unknown        Pancreas, unknown relative    ROS: Review of Systems  Constitutional: Negative for weight loss.  Cardiovascular: Negative for chest pain and claudication.  Gastrointestinal: Negative for nausea and vomiting.  Musculoskeletal: Negative for myalgias.       Negative for muscle weakness  Endo/Heme/Allergies:       Negative for hypoglycemia    PHYSICAL EXAM: Blood pressure 140/85, pulse 73, temperature 98.3 F (36.8 C), temperature source Oral, height 5\' 4"  (1.626 m), weight 194 lb (88 kg), last menstrual period 11/16/2012, SpO2 98 %. Body mass index is 33.3 kg/m. Physical Exam  Constitutional: She is oriented to person, place, and time. She appears well-developed and well-nourished.  HENT:  Head: Normocephalic.  Cardiovascular: Normal rate.  Pulmonary/Chest: Effort normal.  Musculoskeletal: Normal range of motion.  Neurological: She is alert and oriented to person, place, and time.  Skin: Skin is warm and dry.  Psychiatric: She has a normal mood and affect. Her behavior is normal.  Vitals reviewed.   RECENT LABS AND TESTS: BMET    Component Value Date/Time   NA 142 12/18/2017 1245   K 4.0 12/18/2017 1245   CL 100  12/18/2017 1245   CO2 25 12/18/2017 1245   GLUCOSE 88 12/18/2017 1245   GLUCOSE 90 12/10/2015 0951   BUN 15 12/18/2017 1245   CREATININE 0.63 12/18/2017 1245   CREATININE 0.60 12/10/2015 0951   CALCIUM 9.5 12/18/2017 1245   GFRNONAA 104 12/18/2017 1245   GFRNONAA >89 12/10/2015 0951   GFRAA 120 12/18/2017 1245   GFRAA >89 12/10/2015 0951   Lab Results  Component Value Date   HGBA1C 6.4 (H) 12/18/2017   HGBA1C 6.2 11/10/2016   HGBA1C 5.9 12/10/2015   HGBA1C 5.7 02/12/2015   HGBA1C 5.9 12/12/2013   Lab Results  Component Value Date   INSULIN 11.7 12/18/2017   CBC  Component Value Date/Time   WBC 6.1 12/18/2017 1245   WBC 5.5 12/10/2015 0951   RBC 4.74 12/18/2017 1245   RBC 4.95 12/10/2015 0951   HGB 14.3 12/18/2017 1245   HCT 43.1 12/18/2017 1245   PLT 326 12/10/2015 0951   MCV 91 12/18/2017 1245   MCH 30.2 12/18/2017 1245   MCH 31.1 12/10/2015 0951   MCHC 33.2 12/18/2017 1245   MCHC 34.4 12/10/2015 0951   RDW 13.0 12/18/2017 1245   LYMPHSABS 2.8 12/18/2017 1245   MONOABS 330 12/10/2015 0951   EOSABS 0.1 12/18/2017 1245   BASOSABS 0.0 12/18/2017 1245   Iron/TIBC/Ferritin/ %Sat No results found for: IRON, TIBC, FERRITIN, IRONPCTSAT Lipid Panel     Component Value Date/Time   CHOL 262 (H) 12/18/2017 1245   TRIG 94 12/18/2017 1245   HDL 68 12/18/2017 1245   CHOLHDL 4.8 10/27/2017 1020   VLDL 15 10/27/2017 1020   LDLCALC 175 (H) 12/18/2017 1245   Hepatic Function Panel     Component Value Date/Time   PROT 7.4 12/18/2017 1245   ALBUMIN 4.6 12/18/2017 1245   AST 21 12/18/2017 1245   ALT 20 12/18/2017 1245   ALKPHOS 74 12/18/2017 1245   BILITOT 0.7 12/18/2017 1245   BILIDIR 0.2 02/22/2008      Component Value Date/Time   TSH 0.554 12/18/2017 1245   TSH 0.71 12/10/2015 0951   TSH 0.717 03/05/2012 1700    Ref. Range 12/18/2017 12:45  Vitamin D, 25-Hydroxy Latest Ref Range: 30.0 - 100.0 ng/mL 15.3 (L)    ASSESSMENT AND PLAN: Other hyperlipidemia -  Plan: Coenzyme Q10 (CO Q-10) 300 MG CAPS  Vitamin D deficiency - Plan: Vitamin D, Ergocalciferol, (DRISDOL) 50000 units CAPS capsule  Prediabetes - Plan: metFORMIN (GLUCOPHAGE) 500 MG tablet  At risk for diabetes mellitus  Class 1 obesity with serious comorbidity and body mass index (BMI) of 33.0 to 33.9 in adult, unspecified obesity type  PLAN: Hyperlipidemia Sindia was informed of the American Heart Association Guidelines emphasizing intensive lifestyle modifications as the first line treatment for hyperlipidemia. We discussed many lifestyle modifications today in depth, and Tylena will continue to work on decreasing saturated fats such as fatty red meat, butter and many fried foods. She agrees to start CoQ10 300 mg daily #30 with no refills and atorvastatin (she has at home).  She will also increase vegetables and lean protein in her diet and continue to work on exercise and weight loss efforts.  Vitamin D Deficiency Melanie Cole was informed that low vitamin D levels contributes to fatigue and are associated with obesity, breast, and colon cancer. She agrees to start to take prescription Vit D @50 ,000 IU every week #4 with no refills  and will follow up for routine testing of vitamin D, at least 2-3 times per year. She was informed of the risk of over-replacement of vitamin D and agrees to not increase her dose unless she discusses this with [place] first. Agrees to follow up with our clinic as directed.   Pre-Diabetes Melanie Cole will continue to work on weight loss, exercise, and decreasing simple carbohydrates in her diet to help decrease the risk of diabetes. We dicussed metformin including benefits and risks. She was informed that eating too many simple carbohydrates or too many calories at one sitting increases the likelihood of GI side effects. Melanie Cole requested metformin for now and a prescription for 500 mg daily with no refills was written today. Melanie Cole agreed to follow up with [place] as directed to  monitor  her progress.  Diabetes risk counselling Melanie Cole was given extended (15 minutes) diabetes prevention counseling today. She is 52 y.o. female and has risk factors for diabetes including obesity. We discussed intensive lifestyle modifications today with an emphasis on weight loss as well as increasing exercise and decreasing simple carbohydrates in her diet.  Obesity Melanie Cole is currently in the action stage of change. As such, her goal is to continue with weight loss efforts She has agreed to follow the Category 3 plan Melanie Cole has been instructed to work up to a goal of 150 minutes of combined cardio and strengthening exercise per week for weight loss and overall health benefits. We discussed the following Behavioral Modification Stratagies today: increasing lean protein intake, increasing vegetables, better snacking choices, planning for success,  and work on meal planning and easy cooking plans.    Melanie Cole has agreed to follow up with our clinic in 2 weeks. She was informed of the importance of frequent follow up visits to maximize her success with intensive lifestyle modifications for her multiple health conditions.   OBESITY BEHAVIORAL INTERVENTION VISIT  Today's visit was # 2   Starting weight: 194 lb Starting date: 12/18/17 Today's weight : 194 lb Today's date: 01/01/2018 Total lbs lost to date: 0 At least 15 minutes were spent on discussing the following behavioral intervention visit.   ASK: We discussed the diagnosis of obesity with Melanie Cole today and Melanie Cole agreed to give [place] permission to discuss obesity behavioral modification therapy today.  ASSESS: Melanie Cole has the diagnosis of obesity and her BMI today is 33.28 Melanie Cole is in the action stage of change   ADVISE: Melanie Cole was educated on the multiple health risks of obesity as well as the benefit of weight loss to improve her health. She was advised of the need for long term treatment and the importance of lifestyle  modifications to improve her current health and to decrease her risk of future health problems.  AGREE: Multiple dietary modification options and treatment options were discussed and  Melanie Cole agreed to follow the recommendations documented in the above note.  ARRANGE: Melanie Cole was educated on the importance of frequent visits to treat obesity as outlined per CMS and USPSTF guidelines and agreed to schedule her next follow up appointment today.  I, Renee Ramus, am acting as transcriptionist for Ilene Qua, MD   I have reviewed the above documentation for accuracy and completeness, and I agree with the above. - Ilene Qua, MD

## 2018-01-17 ENCOUNTER — Ambulatory Visit (INDEPENDENT_AMBULATORY_CARE_PROVIDER_SITE_OTHER): Payer: 59 | Admitting: Family Medicine

## 2018-01-17 VITALS — BP 124/87 | HR 71 | Temp 98.5°F | Ht 64.0 in | Wt 189.0 lb

## 2018-01-17 DIAGNOSIS — E559 Vitamin D deficiency, unspecified: Secondary | ICD-10-CM

## 2018-01-17 DIAGNOSIS — Z6832 Body mass index (BMI) 32.0-32.9, adult: Secondary | ICD-10-CM | POA: Diagnosis not present

## 2018-01-17 DIAGNOSIS — E669 Obesity, unspecified: Secondary | ICD-10-CM

## 2018-01-17 DIAGNOSIS — I10 Essential (primary) hypertension: Secondary | ICD-10-CM

## 2018-01-18 NOTE — Progress Notes (Signed)
Office: [phone]  /  Fax: [phone]   HPI:   Chief Complaint: OBESITY Melanie Cole is here to discuss her progress with her obesity treatment plan. She is on the Category 3 plan and is following her eating plan approximately 90 % of the time. She states she is walking on the treadmill for 20 minutes 6 times per week. The last few weeks have been more difficult secondary to redundancy. She didn't follow the plan for 5 to 6 meals which were mostly dinner. She denies hunger and is enjoying sweet treats for snacks.  Her weight is 189 lb (85.7 kg) today and has had a weight loss of 5 pounds over a period of 3 weeks since her last visit. She has lost 5 lbs since starting treatment with [place].  Hypertension Melanie Cole is a 52 y.o. female with hypertension. Melanie Cole denies chest pain, chest pressure, or headaches. She is working on weight loss to help control her blood pressure with the goal of decreasing her risk of heart attack and stroke. Lavaeh's blood pressure is currently controlled.  Vitamin D deficiency Melanie Cole has a diagnosis of vitamin D deficiency. She is currently taking a vit D supplement. She admits fatigue and denies nausea, vomiting or muscle weakness.  ALLERGIES: Allergies  Allergen Reactions  . Ace Inhibitors     REACTION: Atopic rash  . Amlodipine Besylate Other (See Comments)    Peripheral edema and dry mouth.     MEDICATIONS: Current Outpatient Medications on File Prior to Visit  Medication Sig Dispense Refill  . Coenzyme Q10 (CO Q-10) 300 MG CAPS Take 1 tablet by mouth daily. 30 each 0  . diltiazem (CARTIA XT) 240 MG 24 hr capsule Take 1 capsule (240 mg total) by mouth daily. 30 capsule PRN  . diphenhydrAMINE (BENADRYL) 25 MG tablet Take 25 mg by mouth every 6 (six) hours as needed.    . hydrochlorothiazide (HYDRODIURIL) 25 MG tablet TAKE 1 TABLET BY MOUTH ONCE DAILY 90 tablet 3  . linaclotide (LINZESS) 145 MCG CAPS capsule Take 145 mcg by mouth daily before  breakfast.    . MELATONIN ER PO Take 1 capsule by mouth daily.    . metFORMIN (GLUCOPHAGE) 500 MG tablet Take 1 tablet (500 mg total) by mouth daily with breakfast. 30 tablet 0  . omeprazole (PRILOSEC) 40 MG capsule Take 40 mg by mouth daily.    . Vitamin D, Ergocalciferol, (DRISDOL) 50000 units CAPS capsule Take 1 capsule (50,000 Units total) by mouth every 7 (seven) days. 4 capsule 0   No current facility-administered medications on file prior to visit.     PAST MEDICAL HISTORY: Past Medical History:  Diagnosis Date  . Adenomyosis   . Atopic dermatitis 12/18/2012  . Chest pain, unspecified 12/16/2013  . Constipation   . Essential hypertension, benign   . GERD (gastroesophageal reflux disease)   . Hemangioma of liver 09/10/2010   09/09/10 Abdominopelvic CT found incidental 1.8 cm round mass in the left lobe of the liver which is likely a solitary cavernous hemangioma    . High blood pressure   . High cholesterol   . History of gestational diabetes   . History of postmenopausal bleeding 12/16/2013  . History of pulmonary function tests 2002   Normal values.  . Hypertension   . IBS (irritable bowel syndrome)   . Lesion of liver 02/13/2015  . Liver problem   . MIGRAINE, UNSPEC., W/O INTRACTABLE MIGRAINE 06/01/2006       . Mondor's disease   .  Patellar tendinitis/bursitis 12/18/2012  . Postmenopause   . Pre-diabetes   . Urinary, incontinence, stress female 04/08/2014  . Uterus, adenomyosis    Per patient's recall  . Vasomotor flushing 12/16/2013  . Vitamin D deficiency     PAST SURGICAL HISTORY: Past Surgical History:  Procedure Laterality Date  . Bladder "Stretched"    . CARDIOVASCULAR STRESS TEST  12/03/2000   Stress Echocargiogram - 12/03/2000, NORMAL  . COLONOSCOPY  05/05/2000    Dr Lyndel Safe [facility] Alert)- Internal Hemorrhoids  . SPIROMETRY  12/03/2000   Normal Pulmonary Function Testing   . TUBAL LIGATION      SOCIAL HISTORY: Social History   Tobacco Use  . Smoking status: Never  Smoker  . Smokeless tobacco: Never Used  Substance Use Topics  . Alcohol use: No  . Drug use: No    FAMILY HISTORY: Family History  Problem Relation Age of Onset  . Pancreatic cancer Father        Died age 61  . Arrhythmia Father        Required a permanent pacemaker  . Diabetes Father   . Colon cancer Cousin        Colorectal Cancer  . Colon cancer Unknown        Aunt with colon cancer  . Diabetes Mother   . Thyroid disease Mother   . High blood pressure Mother   . High Cholesterol Mother   . Cancer Unknown        Pancreas, unknown relative    ROS: Review of Systems  Constitutional: Positive for malaise/fatigue and weight loss.  Cardiovascular: Negative for chest pain.       Negative for chest pressure.  Gastrointestinal: Negative for nausea and vomiting.  Musculoskeletal:       Negative for muscle weakness.  Neurological: Negative for headaches.    PHYSICAL EXAM: Blood pressure 124/87, pulse 71, temperature 98.5 F (36.9 C), temperature source Oral, height 5\' 4"  (1.626 m), weight 189 lb (85.7 kg), last menstrual period 11/16/2012, SpO2 95 %. Body mass index is 32.44 kg/m. Physical Exam  Constitutional: She is oriented to person, place, and time. She appears well-developed and well-nourished.  Cardiovascular: Normal rate.  Pulmonary/Chest: Effort normal.  Musculoskeletal: Normal range of motion.  Neurological: She is oriented to person, place, and time.  Skin: Skin is warm and dry.  Psychiatric: She has a normal mood and affect. Her behavior is normal.  Vitals reviewed.   RECENT LABS AND TESTS: BMET    Component Value Date/Time   NA 142 12/18/2017 1245   K 4.0 12/18/2017 1245   CL 100 12/18/2017 1245   CO2 25 12/18/2017 1245   GLUCOSE 88 12/18/2017 1245   GLUCOSE 90 12/10/2015 0951   BUN 15 12/18/2017 1245   CREATININE 0.63 12/18/2017 1245   CREATININE 0.60 12/10/2015 0951   CALCIUM 9.5 12/18/2017 1245   GFRNONAA 104 12/18/2017 1245   GFRNONAA >89  12/10/2015 0951   GFRAA 120 12/18/2017 1245   GFRAA >89 12/10/2015 0951   Lab Results  Component Value Date   HGBA1C 6.4 (H) 12/18/2017   HGBA1C 6.2 11/10/2016   HGBA1C 5.9 12/10/2015   HGBA1C 5.7 02/12/2015   HGBA1C 5.9 12/12/2013   Lab Results  Component Value Date   INSULIN 11.7 12/18/2017   CBC    Component Value Date/Time   WBC 6.1 12/18/2017 1245   WBC 5.5 12/10/2015 0951   RBC 4.74 12/18/2017 1245   RBC 4.95 12/10/2015 0951   HGB 14.3 12/18/2017  1245   HCT 43.1 12/18/2017 1245   PLT 326 12/10/2015 0951   MCV 91 12/18/2017 1245   MCH 30.2 12/18/2017 1245   MCH 31.1 12/10/2015 0951   MCHC 33.2 12/18/2017 1245   MCHC 34.4 12/10/2015 0951   RDW 13.0 12/18/2017 1245   LYMPHSABS 2.8 12/18/2017 1245   MONOABS 330 12/10/2015 0951   EOSABS 0.1 12/18/2017 1245   BASOSABS 0.0 12/18/2017 1245   Iron/TIBC/Ferritin/ %Sat No results found for: IRON, TIBC, FERRITIN, IRONPCTSAT Lipid Panel     Component Value Date/Time   CHOL 262 (H) 12/18/2017 1245   TRIG 94 12/18/2017 1245   HDL 68 12/18/2017 1245   CHOLHDL 4.8 10/27/2017 1020   VLDL 15 10/27/2017 1020   LDLCALC 175 (H) 12/18/2017 1245   Hepatic Function Panel     Component Value Date/Time   PROT 7.4 12/18/2017 1245   ALBUMIN 4.6 12/18/2017 1245   AST 21 12/18/2017 1245   ALT 20 12/18/2017 1245   ALKPHOS 74 12/18/2017 1245   BILITOT 0.7 12/18/2017 1245   BILIDIR 0.2 02/22/2008      Component Value Date/Time   TSH 0.554 12/18/2017 1245   TSH 0.71 12/10/2015 0951   TSH 0.717 03/05/2012 1700   Results for CLEASTER, SHIFFER (MRN [id number]) as of 01/18/2018 16:47  Ref. Range 12/18/2017 12:45  Vitamin D, 25-Hydroxy Latest Ref Range: 30.0 - 100.0 ng/mL 15.3 (L)   ASSESSMENT AND PLAN: Essential hypertension  Vitamin D deficiency  Class 1 obesity with serious comorbidity and body mass index (BMI) of 32.0 to 32.9 in adult, unspecified obesity type  PLAN:  Hypertension We discussed sodium restriction, working  on healthy weight loss, and a regular exercise program as the means to achieve improved blood pressure control. Lyberti agreed with this plan and agreed to follow up as directed. We will continue to monitor her blood pressure as well as her progress with the above lifestyle modifications. She will continue her medications as prescribed and will watch for signs of hypotension as she continues her lifestyle modifications. Chandra agrees to follow up in 2 weeks.  Vitamin D Deficiency Taeko was informed that low vitamin D levels contributes to fatigue and are associated with obesity, breast, and colon cancer. She agrees to continue to take her Vit D supplement and will follow up for routine testing of vitamin D, at least 2-3 times per year. She was informed of the risk of over-replacement of vitamin D and agrees to not increase her dose unless she discusses this with [place] first. Brian agrees to follow up as directed.  I spent > than 50% of the 15 minute visit on counseling as documented in the note.  Obesity Jeslyn is currently in the action stage of change. As such, her goal is to continue with weight loss efforts. She has agreed to follow the Category 3 plan. Marcelle has been instructed to work up to a goal of 150 minutes of combined cardio and strengthening exercise per week for weight loss and overall health benefits. We discussed the following Behavioral Modification Strategies today: increasing lean protein intake, increasing vegetables, work on meal planning and easy cooking plans, better snacking choices, and planning for success.  Alverna has agreed to follow up with our clinic in 2 weeks. She was informed of the importance of frequent follow up visits to maximize her success with intensive lifestyle modifications for her multiple health conditions.   OBESITY BEHAVIORAL INTERVENTION VISIT  Today's visit was # 3   Starting  weight: 194 lbs Starting date: 12/18/17 Today's weight : Weight: 189 lb  (85.7 kg)  Today's date: 01/17/2018 Total lbs lost to date: 5  ASK: We discussed the diagnosis of obesity with Herbie Drape today and Jabria agreed to give [place] permission to discuss obesity behavioral modification therapy today.  ASSESS: Nyemah has the diagnosis of obesity and her BMI today is 32.43. Pollyanna is in the action stage of change.   ADVISE: Abryana was educated on the multiple health risks of obesity as well as the benefit of weight loss to improve her health. She was advised of the need for long term treatment and the importance of lifestyle modifications to improve her current health and to decrease her risk of future health problems.  AGREE: Multiple dietary modification options and treatment options were discussed and Tamre agreed to follow the recommendations documented in the above note.  ARRANGE: Joyice was educated on the importance of frequent visits to treat obesity as outlined per CMS and USPSTF guidelines and agreed to schedule her next follow up appointment today.  I, Marcille Blanco, am acting as Location manager for Eber Flett, MD  I have reviewed the above documentation for accuracy and completeness, and I agree with the above. - Ilene Qua, MD

## 2018-02-01 ENCOUNTER — Ambulatory Visit (INDEPENDENT_AMBULATORY_CARE_PROVIDER_SITE_OTHER): Payer: 59 | Admitting: Family Medicine

## 2018-02-01 ENCOUNTER — Encounter (INDEPENDENT_AMBULATORY_CARE_PROVIDER_SITE_OTHER): Payer: Self-pay | Admitting: Family Medicine

## 2018-02-01 VITALS — BP 118/82 | HR 74 | Temp 98.2°F | Ht 64.0 in | Wt 187.0 lb

## 2018-02-01 DIAGNOSIS — R7303 Prediabetes: Secondary | ICD-10-CM

## 2018-02-01 DIAGNOSIS — E66811 Obesity, class 1: Secondary | ICD-10-CM

## 2018-02-01 DIAGNOSIS — E559 Vitamin D deficiency, unspecified: Secondary | ICD-10-CM

## 2018-02-01 DIAGNOSIS — Z9189 Other specified personal risk factors, not elsewhere classified: Secondary | ICD-10-CM

## 2018-02-01 DIAGNOSIS — E669 Obesity, unspecified: Secondary | ICD-10-CM

## 2018-02-01 DIAGNOSIS — E7849 Other hyperlipidemia: Secondary | ICD-10-CM

## 2018-02-01 DIAGNOSIS — Z6832 Body mass index (BMI) 32.0-32.9, adult: Secondary | ICD-10-CM

## 2018-02-01 MED ORDER — VITAMIN D (ERGOCALCIFEROL) 1.25 MG (50000 UNIT) PO CAPS: 50000.0000 [IU] | ORAL_CAPSULE | ORAL | 0 refills | Status: DC

## 2018-02-01 MED ORDER — METFORMIN HCL 500 MG PO TABS: 500.0000 mg | ORAL_TABLET | Freq: Two times a day (BID) | ORAL | 0 refills | Status: DC

## 2018-02-05 ENCOUNTER — Encounter (INDEPENDENT_AMBULATORY_CARE_PROVIDER_SITE_OTHER): Payer: Self-pay | Admitting: Family Medicine

## 2018-02-05 NOTE — Progress Notes (Signed)
Office: [phone]  /  Fax: [phone]   HPI:   Chief Complaint: OBESITY Melanie Cole is here to discuss her progress with her obesity treatment plan. She is on the Category 3 plan and is following her eating plan approximately 80 to 90 % of the time. She states that she is walking on the treadmill and using the elliptical 20 to 30 minutes 6 times per week. Melanie Cole has been doing intermittent fasting and skipping breakfast. She has had cravings for salty foods.  Her weight is 187 lb (84.8 kg) today and has had a weight loss of 2 pounds over a period of 2 weeks since her last visit. She has lost 7 lbs since starting treatment with [place].  Pre-Diabetes Melanie Cole has a diagnosis of pre-diabetes based on her elevated Hgb A1c and was informed this puts her at greater risk of developing diabetes. She is taking metformin currently and continues to work on diet and exercise to decrease risk of diabetes. She has had decreased cravings for sweets. She admits polyphagia and denies nausea, vomiting, or diarrhea.  At risk for diabetes Hayslee is at higher than average risk for developing diabetes due to her pre-diabetes and obesity.  Vitamin D deficiency Melanie Cole has a diagnosis of vitamin D deficiency. She is currently taking vit D and denies nausea, vomiting or muscle weakness.  Hyperlipidemia Melanie Cole has hyperlipidemia and has been trying to improve her cholesterol levels with intensive lifestyle modification including a low saturated fat diet, exercise and weight loss. She is not taking the statin prescribed by cardiology. She denies any chest pain or shortness of breath. Lab Results  Component Value Date   LDLCALC 175 (H) 12/18/2017    The 10-year ASCVD risk score Melanie Bussing DC [name]., et al., 2013) is: 2.9%   Values used to calculate the score:     Age: 56 years     Sex: Female     Is Non-Hispanic African American: Yes     Diabetic: No     Tobacco smoker: No     Systolic Blood Pressure: 786 mmHg     Is BP  treated: Yes     HDL Cholesterol: 68 mg/dL     Total Cholesterol: 262 mg/dL'  ALLERGIES: Allergies  Allergen Reactions  . Ace Inhibitors     REACTION: Atopic rash  . Amlodipine Besylate Other (See Comments)    Peripheral edema and dry mouth.     MEDICATIONS: Current Outpatient Medications on File Prior to Visit  Medication Sig Dispense Refill  . Coenzyme Q10 (CO Q-10) 300 MG CAPS Take 1 tablet by mouth daily. 30 each 0  . diltiazem (CARTIA XT) 240 MG 24 hr capsule Take 1 capsule (240 mg total) by mouth daily. 30 capsule PRN  . diphenhydrAMINE (BENADRYL) 25 MG tablet Take 25 mg by mouth every 6 (six) hours as needed.    . hydrochlorothiazide (HYDRODIURIL) 25 MG tablet TAKE 1 TABLET BY MOUTH ONCE DAILY 90 tablet 3  . linaclotide (LINZESS) 145 MCG CAPS capsule Take 145 mcg by mouth daily before breakfast.    . MELATONIN ER PO Take 1 capsule by mouth daily.    [name] omeprazole (PRILOSEC) 40 MG capsule Take 40 mg by mouth daily.     No current facility-administered medications on file prior to visit.     PAST MEDICAL HISTORY: Past Medical History:  Diagnosis Date  . Adenomyosis   . Atopic dermatitis 12/18/2012  . Chest pain, unspecified 12/16/2013  . Constipation   . Essential  hypertension, benign   . GERD (gastroesophageal reflux disease)   . Hemangioma of liver 09/10/2010   09/09/10 Abdominopelvic CT found incidental 1.8 cm round mass in the left lobe of the liver which is likely a solitary cavernous hemangioma    . High blood pressure   . High cholesterol   . History of gestational diabetes   . History of postmenopausal bleeding 12/16/2013  . History of pulmonary function tests 2002   Normal values.  . Hypertension   . IBS (irritable bowel syndrome)   . Lesion of liver 02/13/2015  . Liver problem   . MIGRAINE, UNSPEC., W/O INTRACTABLE MIGRAINE 06/01/2006       . Mondor's disease   . Patellar tendinitis/bursitis 12/18/2012  . Postmenopause   . Pre-diabetes   . Urinary,  incontinence, stress female 04/08/2014  . Uterus, adenomyosis    Per patient's recall  . Vasomotor flushing 12/16/2013  . Vitamin D deficiency     PAST SURGICAL HISTORY: Past Surgical History:  Procedure Laterality Date  . Bladder "Stretched"    . CARDIOVASCULAR STRESS TEST  12/03/2000   Stress Echocargiogram - 12/03/2000, NORMAL  . COLONOSCOPY  05/05/2000    Dr Lyndel Safe Tia Alert)- Internal Hemorrhoids  . SPIROMETRY  12/03/2000   Normal Pulmonary Function Testing   . TUBAL LIGATION      SOCIAL HISTORY: Social History   Tobacco Use  . Smoking status: Never Smoker  . Smokeless tobacco: Never Used  Substance Use Topics  . Alcohol use: No  . Drug use: No    FAMILY HISTORY: Family History  Problem Relation Age of Onset  . Pancreatic cancer Father        Died age 39  . Arrhythmia Father        Required a permanent pacemaker  . Diabetes Father   . Colon cancer Cousin        Colorectal Cancer  . Colon cancer Unknown        Aunt with colon cancer  . Diabetes Mother   . Thyroid disease Mother   . High blood pressure Mother   . High Cholesterol Mother   . Cancer Unknown        Pancreas, unknown relative    ROS: Review of Systems  Constitutional: Positive for weight loss.  Respiratory: Negative for shortness of breath.   Cardiovascular: Negative for chest pain.  Gastrointestinal: Negative for diarrhea, nausea and vomiting.  Musculoskeletal:       Negative for muscle weakness.  Endo/Heme/Allergies:       Positive for polyphagia.    PHYSICAL EXAM: Blood pressure 118/82, pulse 74, temperature 98.2 F (36.8 C), temperature source Oral, height 5\' 4"  (1.626 m), weight 187 lb (84.8 kg), last menstrual period 11/16/2012, SpO2 95 %. Body mass index is 32.1 kg/m. Physical Exam  Constitutional: She is oriented to person, place, and time. She appears well-developed and well-nourished.  Cardiovascular: Normal rate.  Pulmonary/Chest: Effort normal.  Musculoskeletal: Normal range of  motion.  Neurological: She is oriented to person, place, and time.  Skin: Skin is warm and dry.  Psychiatric: She has a normal mood and affect. Her behavior is normal.  Vitals reviewed.   RECENT LABS AND TESTS: BMET    Component Value Date/Time   NA 142 12/18/2017 1245   K 4.0 12/18/2017 1245   CL 100 12/18/2017 1245   CO2 25 12/18/2017 1245   GLUCOSE 88 12/18/2017 1245   GLUCOSE 90 12/10/2015 0951   BUN 15 12/18/2017 1245  CREATININE 0.63 12/18/2017 1245   CREATININE 0.60 12/10/2015 0951   CALCIUM 9.5 12/18/2017 1245   GFRNONAA 104 12/18/2017 1245   GFRNONAA >89 12/10/2015 0951   GFRAA 120 12/18/2017 1245   GFRAA >89 12/10/2015 0951   Lab Results  Component Value Date   HGBA1C 6.4 (H) 12/18/2017   HGBA1C 6.2 11/10/2016   HGBA1C 5.9 12/10/2015   HGBA1C 5.7 02/12/2015   HGBA1C 5.9 12/12/2013   Lab Results  Component Value Date   INSULIN 11.7 12/18/2017   CBC    Component Value Date/Time   WBC 6.1 12/18/2017 1245   WBC 5.5 12/10/2015 0951   RBC 4.74 12/18/2017 1245   RBC 4.95 12/10/2015 0951   HGB 14.3 12/18/2017 1245   HCT 43.1 12/18/2017 1245   PLT 326 12/10/2015 0951   MCV 91 12/18/2017 1245   MCH 30.2 12/18/2017 1245   MCH 31.1 12/10/2015 0951   MCHC 33.2 12/18/2017 1245   MCHC 34.4 12/10/2015 0951   RDW 13.0 12/18/2017 1245   LYMPHSABS 2.8 12/18/2017 1245   MONOABS 330 12/10/2015 0951   EOSABS 0.1 12/18/2017 1245   BASOSABS 0.0 12/18/2017 1245   Iron/TIBC/Ferritin/ %Sat No results found for: IRON, TIBC, FERRITIN, IRONPCTSAT Lipid Panel     Component Value Date/Time   CHOL 262 (H) 12/18/2017 1245   TRIG 94 12/18/2017 1245   HDL 68 12/18/2017 1245   CHOLHDL 4.8 10/27/2017 1020   VLDL 15 10/27/2017 1020   LDLCALC 175 (H) 12/18/2017 1245   Hepatic Function Panel     Component Value Date/Time   PROT 7.4 12/18/2017 1245   ALBUMIN 4.6 12/18/2017 1245   AST 21 12/18/2017 1245   ALT 20 12/18/2017 1245   ALKPHOS 74 12/18/2017 1245   BILITOT 0.7  12/18/2017 1245   BILIDIR 0.2 02/22/2008      Component Value Date/Time   TSH 0.554 12/18/2017 1245   TSH 0.71 12/10/2015 0951   TSH 0.717 03/05/2012 1700   Results for NAELA, NODAL (MRN [id number]) as of 02/05/2018 11:01  Ref. Range 12/18/2017 12:45  Vitamin D, 25-Hydroxy Latest Ref Range: 30.0 - 100.0 ng/mL 15.3 (L)   ASSESSMENT AND PLAN: Prediabetes - Plan: metFORMIN (GLUCOPHAGE) 500 MG tablet  Vitamin D deficiency - Plan: Vitamin D, Ergocalciferol, (DRISDOL) 50000 units CAPS capsule  Other hyperlipidemia  At risk for diabetes mellitus  Class 1 obesity with serious comorbidity and body mass index (BMI) of 32.0 to 32.9 in adult, unspecified obesity type  PLAN:  Pre-Diabetes Melanie Cole will continue to work on weight loss, exercise, and decreasing simple carbohydrates in her diet to help decrease the risk of diabetes. She was informed that eating too many simple carbohydrates or too many calories at one sitting increases the likelihood of GI side effects. She is having polyphagia so we discussed increasing metformin.  Melanie Cole agrees to increase metformin 500mg  to BID at breakfast and lunch #60 with no refills and a prescription was written today. Melanie Cole agreed to follow up with [place] as directed to monitor her progress in 2 weeks.  Diabetes risk counseling Melanie Cole was given extended (15 minutes) diabetes prevention counseling today. She is 52 y.o. female and has risk factors for diabetes including pre-diabetes and obesity. We discussed intensive lifestyle modifications today with an emphasis on weight loss as well as increasing exercise and decreasing simple carbohydrates in her diet.  Vitamin D Deficiency Melanie Cole was informed that low vitamin D levels contributes to fatigue and are associated with obesity, breast, and colon cancer.  She agrees to continue to take prescription Vit D @50 ,000 IU every week #4 with no refills and will follow up for routine testing of vitamin D, at least 2-3 times  per year. She was informed of the risk of over-replacement of vitamin D and agrees to not increase her dose unless she discusses this with [place] first. Melanie Cole agrees to follow up as directed in 2 weeks.  Hyperlipidemia Melanie Cole was informed of the American Heart Association Guidelines emphasizing intensive lifestyle modifications as the first line treatment for hyperlipidemia. We discussed many lifestyle modifications today in depth, and Melanie Cole will continue to work on decreasing saturated fats such as fatty red meat, butter and many fried foods. She will also increase vegetables and lean protein in her diet and continue to work on exercise and weight loss efforts. We discussed her risk for CV disease over the next 10 years is 2.9%. Melanie Cole wishes to try diet control for 3 months and follow up as directed. We will then re-assess to see if statin is needed.   Obesity Melanie Cole is currently in the action stage of change. As such, her goal is to continue with weight loss efforts. She has agreed to follow the Category 3 plan. She agrees to stop intermittent fasting and no skipping meals. Melanie Cole has been instructed to continue walking on the treadmill and using the elliptical machine 6 times per week. We discussed the following Behavioral Modification Strategies today: no skipping meals and work on meal planning and easy cooking plans.  Melanie Cole has agreed to follow up with our clinic in 2 weeks. She was informed of the importance of frequent follow up visits to maximize her success with intensive lifestyle modifications for her multiple health conditions.   OBESITY BEHAVIORAL INTERVENTION VISIT  Today's visit was # 4   Starting weight: 194 lbs Starting date: 12/18/17 Today's weight : Weight: 187 lb (84.8 kg)  Today's date: 02/01/2018 Total lbs lost to date: 7  ASK: We discussed the diagnosis of obesity with Melanie Cole today and Melanie Cole agreed to give [place] permission to discuss obesity behavioral  modification therapy today.  ASSESS: Melanie Cole has the diagnosis of obesity and her BMI today is 32.08. Melanie Cole is in the action stage of change.   ADVISE: Melanie Cole was educated on the multiple health risks of obesity as well as the benefit of weight loss to improve her health. She was advised of the need for long term treatment and the importance of lifestyle modifications to improve her current health and to decrease her risk of future health problems.  AGREE: Multiple dietary modification options and treatment options were discussed and Janiah agreed to follow the recommendations documented in the above note.  ARRANGE: Miyeko was educated on the importance of frequent visits to treat obesity as outlined per CMS and USPSTF guidelines and agreed to schedule her next follow up appointment today.  Lenward Chancellor, am acting as Location manager for Georgianne Fick, FNP  I have reviewed the above documentation for accuracy and completeness, and I agree with the above.  - Akshath Mccarey, FNP-C.

## 2018-02-22 ENCOUNTER — Ambulatory Visit (INDEPENDENT_AMBULATORY_CARE_PROVIDER_SITE_OTHER): Payer: 59 | Admitting: Family Medicine

## 2018-03-12 ENCOUNTER — Encounter (INDEPENDENT_AMBULATORY_CARE_PROVIDER_SITE_OTHER): Payer: Self-pay | Admitting: Family Medicine

## 2018-03-12 ENCOUNTER — Ambulatory Visit (INDEPENDENT_AMBULATORY_CARE_PROVIDER_SITE_OTHER): Payer: 59 | Admitting: Family Medicine

## 2018-03-12 VITALS — BP 137/84 | HR 66 | Temp 98.0°F | Ht 64.0 in | Wt 185.0 lb

## 2018-03-12 DIAGNOSIS — E669 Obesity, unspecified: Secondary | ICD-10-CM | POA: Diagnosis not present

## 2018-03-12 DIAGNOSIS — F3289 Other specified depressive episodes: Secondary | ICD-10-CM

## 2018-03-12 DIAGNOSIS — Z9189 Other specified personal risk factors, not elsewhere classified: Secondary | ICD-10-CM

## 2018-03-12 DIAGNOSIS — E66811 Obesity, class 1: Secondary | ICD-10-CM

## 2018-03-12 DIAGNOSIS — Z6831 Body mass index (BMI) 31.0-31.9, adult: Secondary | ICD-10-CM

## 2018-03-12 DIAGNOSIS — E559 Vitamin D deficiency, unspecified: Secondary | ICD-10-CM

## 2018-03-13 ENCOUNTER — Encounter (INDEPENDENT_AMBULATORY_CARE_PROVIDER_SITE_OTHER): Payer: Self-pay | Admitting: Family Medicine

## 2018-03-13 MED ORDER — VITAMIN D (ERGOCALCIFEROL) 1.25 MG (50000 UNIT) PO CAPS: 50000.0000 [IU] | ORAL_CAPSULE | ORAL | 0 refills | Status: DC

## 2018-03-13 NOTE — Progress Notes (Signed)
Office: [phone]  /  Fax: [phone]   HPI:   Chief Complaint: OBESITY Melanie Cole is here to discuss her progress with her obesity treatment plan. She is on the  follow the Category 3 plan and is following her eating plan approximately 10 % of the time. She states she is exercising by doing the elliptical for 45 minutes 3 times per week. Melanie Cole has been off plan and dealing with a lot of stress, which is causing her to eat more sweets. She is having marital problems. She started exercising which has helped with stress.  Her weight is 185 lb (83.9 kg) today and has had a weight loss of 2 pounds over a period of 6 weeks since her last visit. She has lost 9 lbs since starting treatment with [place].  Vitamin D deficiency Melanie Cole has a diagnosis of vitamin D deficiency. She is currently taking vit D, but not at goal and denies nausea, vomiting or muscle weakness. She reports fatigue.   Ref. Range 12/18/2017 12:45  Vitamin D, 25-Hydroxy Latest Ref Range: 30.0 - 100.0 ng/mL 15.3 (L)   At risk for osteopenia and osteoporosis Melanie Cole is at higher risk of osteopenia and osteoporosis due to vitamin D deficiency.   Depression with emotional eating behaviors Melanie Cole is struggling with emotional eating and using food for comfort to the extent that it is negatively impacting her health. She often snacks when she is not hungry. Melanie Cole sometimes feels she is out of control and then feels guilty that she made poor food choices. She has been working on behavior modification techniques to help reduce her emotional eating and has been somewhat successful. She is having increased stress in marriage causing increased stress eating. We discussed Wellbutrin. She is starting counseling on January 30th.  She shows no sign of suicidal or homicidal ideations.  Depression screen Melanie Cole 2/9 12/18/2017 12/22/2016 11/10/2016 12/10/2015 02/12/2015  Decreased Interest 3 0 0 0 0  Down, Depressed, Hopeless 3 0 0 0 0  PHQ - 2 Score 6 0 0 0  0  Altered sleeping 3 - - - -  Tired, decreased energy 3 - - - -  Change in appetite 2 - - - -  Feeling bad or failure about yourself  2 - - - -  Trouble concentrating 2 - - - -  Moving slowly or fidgety/restless 0 - - - -  Suicidal thoughts 0 - - - -  PHQ-9 Score 18 - - - -  Difficult doing work/chores Not difficult at all - - - -    At risk for osteopenia and osteoporosis Melanie Cole is at higher risk of osteopenia and osteoporosis due to vitamin D deficiency.   ALLERGIES: Allergies  Allergen Reactions  . Ace Inhibitors     REACTION: Atopic rash  . Amlodipine Besylate Other (See Comments)    Peripheral edema and dry mouth.     MEDICATIONS: Current Outpatient Medications on File Prior to Visit  Medication Sig Dispense Refill  . Coenzyme Q10 (CO Q-10) 300 MG CAPS Take 1 tablet by mouth daily. 30 each 0  . diltiazem (CARTIA XT) 240 MG 24 hr capsule Take 1 capsule (240 mg total) by mouth daily. 30 capsule PRN  . diphenhydrAMINE (BENADRYL) 25 MG tablet Take 25 mg by mouth every 6 (six) hours as needed.    . hydrochlorothiazide (HYDRODIURIL) 25 MG tablet TAKE 1 TABLET BY MOUTH ONCE DAILY 90 tablet 3  . linaclotide (LINZESS) 145 MCG CAPS capsule Take 145 mcg by  mouth daily before breakfast.    . MELATONIN ER PO Take 1 capsule by mouth daily.    . metFORMIN (GLUCOPHAGE) 500 MG tablet Take 1 tablet (500 mg total) by mouth 2 (two) times daily with a meal. 60 tablet 0  . omeprazole (PRILOSEC) 40 MG capsule Take 40 mg by mouth daily.     No current facility-administered medications on file prior to visit.     PAST MEDICAL HISTORY: Past Medical History:  Diagnosis Date  . Adenomyosis   . Atopic dermatitis 12/18/2012  . Chest pain, unspecified 12/16/2013  . Constipation   . Essential hypertension, benign   . GERD (gastroesophageal reflux disease)   . Hemangioma of liver 09/10/2010   09/09/10 Abdominopelvic CT found incidental 1.8 cm round mass in the left lobe of the liver which is likely a  solitary cavernous hemangioma    . High blood pressure   . High cholesterol   . History of gestational diabetes   . History of postmenopausal bleeding 12/16/2013  . History of pulmonary function tests 2002   Normal values.  . Hypertension   . IBS (irritable bowel syndrome)   . Lesion of liver 02/13/2015  . Liver problem   . MIGRAINE, UNSPEC., W/O INTRACTABLE MIGRAINE 06/01/2006       . Mondor's disease   . Patellar tendinitis/bursitis 12/18/2012  . Postmenopause   . Pre-diabetes   . Urinary, incontinence, stress female 04/08/2014  . Uterus, adenomyosis    Per patient's recall  . Vasomotor flushing 12/16/2013  . Vitamin D deficiency     PAST SURGICAL HISTORY: Past Surgical History:  Procedure Laterality Date  . Bladder "Stretched"    . CARDIOVASCULAR STRESS TEST  12/03/2000   Stress Echocargiogram - 12/03/2000, NORMAL  . COLONOSCOPY  05/05/2000    Dr Lyndel Safe [facility] Alert)- Internal Hemorrhoids  . SPIROMETRY  12/03/2000   Normal Pulmonary Function Testing   . TUBAL LIGATION      SOCIAL HISTORY: Social History   Tobacco Use  . Smoking status: Never Smoker  . Smokeless tobacco: Never Used  Substance Use Topics  . Alcohol use: No  . Drug use: No    FAMILY HISTORY: Family History  Problem Relation Age of Onset  . Pancreatic cancer Father        Died age 66  . Arrhythmia Father        Required a permanent pacemaker  . Diabetes Father   . Colon cancer Cousin        Colorectal Cancer  . Colon cancer Unknown        Aunt with colon cancer  . Diabetes Mother   . Thyroid disease Mother   . High blood pressure Mother   . High Cholesterol Mother   . Cancer Unknown        Pancreas, unknown relative    ROS: Review of Systems  Constitutional: Positive for malaise/fatigue and weight loss.  Gastrointestinal: Negative for nausea and vomiting.  Musculoskeletal:       Negative for muscle weakness  Psychiatric/Behavioral: Positive for depression. Negative for suicidal ideas.        Negative for homicidal ideations     PHYSICAL EXAM: Blood pressure 137/84, pulse 66, temperature 98 F (36.7 C), temperature source Oral, height 5\' 4"  (1.626 m), weight 185 lb (83.9 kg), last menstrual period 11/16/2012, SpO2 96 %. Body mass index is 31.76 kg/m. Physical Exam  Constitutional: She is oriented to person, place, and time. She appears well-developed and well-nourished.  HENT:  Head: Normocephalic.  Eyes: Pupils are equal, round, and reactive to light.  Neck: Normal range of motion.  Cardiovascular: Normal rate.  Pulmonary/Chest: Effort normal.  Musculoskeletal: Normal range of motion.  Neurological: She is alert and oriented to person, place, and time.  Skin: Skin is warm and dry.  Psychiatric: She has a normal mood and affect. Her behavior is normal.  Vitals reviewed.   RECENT LABS AND TESTS: BMET    Component Value Date/Time   NA 142 12/18/2017 1245   K 4.0 12/18/2017 1245   CL 100 12/18/2017 1245   CO2 25 12/18/2017 1245   GLUCOSE 88 12/18/2017 1245   GLUCOSE 90 12/10/2015 0951   BUN 15 12/18/2017 1245   CREATININE 0.63 12/18/2017 1245   CREATININE 0.60 12/10/2015 0951   CALCIUM 9.5 12/18/2017 1245   GFRNONAA 104 12/18/2017 1245   GFRNONAA >89 12/10/2015 0951   GFRAA 120 12/18/2017 1245   GFRAA >89 12/10/2015 0951   Lab Results  Component Value Date   HGBA1C 6.4 (H) 12/18/2017   HGBA1C 6.2 11/10/2016   HGBA1C 5.9 12/10/2015   HGBA1C 5.7 02/12/2015   HGBA1C 5.9 12/12/2013   Lab Results  Component Value Date   INSULIN 11.7 12/18/2017   CBC    Component Value Date/Time   WBC 6.1 12/18/2017 1245   WBC 5.5 12/10/2015 0951   RBC 4.74 12/18/2017 1245   RBC 4.95 12/10/2015 0951   HGB 14.3 12/18/2017 1245   HCT 43.1 12/18/2017 1245   PLT 326 12/10/2015 0951   MCV 91 12/18/2017 1245   MCH 30.2 12/18/2017 1245   MCH 31.1 12/10/2015 0951   MCHC 33.2 12/18/2017 1245   MCHC 34.4 12/10/2015 0951   RDW 13.0 12/18/2017 1245   LYMPHSABS 2.8  12/18/2017 1245   MONOABS 330 12/10/2015 0951   EOSABS 0.1 12/18/2017 1245   BASOSABS 0.0 12/18/2017 1245   Iron/TIBC/Ferritin/ %Sat No results found for: IRON, TIBC, FERRITIN, IRONPCTSAT Lipid Panel     Component Value Date/Time   CHOL 262 (H) 12/18/2017 1245   TRIG 94 12/18/2017 1245   HDL 68 12/18/2017 1245   CHOLHDL 4.8 10/27/2017 1020   VLDL 15 10/27/2017 1020   LDLCALC 175 (H) 12/18/2017 1245   Hepatic Function Panel     Component Value Date/Time   PROT 7.4 12/18/2017 1245   ALBUMIN 4.6 12/18/2017 1245   AST 21 12/18/2017 1245   ALT 20 12/18/2017 1245   ALKPHOS 74 12/18/2017 1245   BILITOT 0.7 12/18/2017 1245   BILIDIR 0.2 02/22/2008      Component Value Date/Time   TSH 0.554 12/18/2017 1245   TSH 0.71 12/10/2015 0951   TSH 0.717 03/05/2012 1700    Ref. Range 12/18/2017 12:45  Vitamin D, 25-Hydroxy Latest Ref Range: 30.0 - 100.0 ng/mL 15.3 (L)    ASSESSMENT AND PLAN: Vitamin D deficiency - Plan: Vitamin D, Ergocalciferol, (DRISDOL) 1.25 MG (50000 UT) CAPS capsule  At risk for osteoporosis  Other depression - with emotional eating  Class 1 obesity with serious comorbidity and body mass index (BMI) of 31.0 to 31.9 in adult, unspecified obesity type  PLAN: Vitamin D Deficiency Sephira was informed that low vitamin D levels contributes to fatigue and are associated with obesity, breast, and colon cancer. She agrees to continue to take prescription Vit D @50 ,000 IU every week #4 with no refills and will follow up for routine testing of vitamin D, at least 2-3 times per year. She was informed of the risk of  over-replacement of vitamin D and agrees to not increase her dose unless she discusses this with [place] first. Agrees to follow up with our clinic as directed.   Depression with Emotional Eating Behaviors We discussed behavior modification techniques today to help Ricketta deal with her emotional eating and depression. She will consider taking Wellbutrin. She will start  counseling and agreed to follow up as directed.  At risk for osteopenia and osteoporosis Melanie Cole was given extended  (15 minutes) osteoporosis prevention counseling today. Athalia is at risk for osteopenia and osteoporosis due to her vitamin D deficiency. She was encouraged to take her vitamin D and follow her higher calcium diet and increase strengthening exercise to help strengthen her bones and decrease her risk of osteopenia and osteoporosis.  Obesity Kyira is currently in the action stage of change. As such, her goal is to continue with weight loss efforts She has agreed to follow the Category 3 plan Adeliz has been instructed to continue the elliptical for 45 minutes 3 time per week for weight loss and overall health benefits. We discussed the following Behavioral Modification Strategies today: emotional eating strategies, avoiding temptations, and planning for success.    Cambreigh has agreed to follow up with our clinic in 2-3 weeks. She was informed of the importance of frequent follow up visits to maximize her success with intensive lifestyle modifications for her multiple health conditions.   OBESITY BEHAVIORAL INTERVENTION VISIT  Today's visit was # 5   Starting weight: 194 lb Starting date: 12/18/17 Today's weight : Weight: 185 lb (83.9 kg)  Today's date: 03/12/18 Total lbs lost to date: 9 lb At least 15 minutes were spent on discussing the following behavioral intervention visit.   ASK: We discussed the diagnosis of obesity with Herbie Drape today and Yissel agreed to give [place] permission to discuss obesity behavioral modification therapy today.  ASSESS: Chantal has the diagnosis of obesity and her BMI today is 31.74 Floretta is in the action stage of change   ADVISE: Gladie was educated on the multiple health risks of obesity as well as the benefit of weight loss to improve her health. She was advised of the need for long term treatment and the importance of lifestyle  modifications to improve her current health and to decrease her risk of future health problems.  AGREE: Multiple dietary modification options and treatment options were discussed and  Sybil agreed to follow the recommendations documented in the above note.  ARRANGE: Alece was educated on the importance of frequent visits to treat obesity as outlined per CMS and USPSTF guidelines and agreed to schedule her next follow up appointment today.  I, Renee Ramus, am acting as Location manager for [employer], FNP-C.  I have reviewed the above documentation for accuracy and completeness, and I agree with the above.  - Kaleia Longhi, FNP-C.

## 2018-03-30 ENCOUNTER — Encounter (INDEPENDENT_AMBULATORY_CARE_PROVIDER_SITE_OTHER): Payer: Self-pay | Admitting: Family Medicine

## 2018-04-02 ENCOUNTER — Encounter (INDEPENDENT_AMBULATORY_CARE_PROVIDER_SITE_OTHER): Payer: Self-pay

## 2018-04-02 ENCOUNTER — Ambulatory Visit (INDEPENDENT_AMBULATORY_CARE_PROVIDER_SITE_OTHER): Payer: 59 | Admitting: Family Medicine

## 2018-04-05 ENCOUNTER — Encounter (INDEPENDENT_AMBULATORY_CARE_PROVIDER_SITE_OTHER): Payer: Self-pay

## 2018-04-09 ENCOUNTER — Other Ambulatory Visit (INDEPENDENT_AMBULATORY_CARE_PROVIDER_SITE_OTHER): Payer: Self-pay | Admitting: Family Medicine

## 2018-04-09 DIAGNOSIS — R7303 Prediabetes: Secondary | ICD-10-CM

## 2018-04-19 ENCOUNTER — Ambulatory Visit (INDEPENDENT_AMBULATORY_CARE_PROVIDER_SITE_OTHER): Payer: 59 | Admitting: Family Medicine

## 2018-04-19 ENCOUNTER — Encounter: Payer: Self-pay | Admitting: Family Medicine

## 2018-04-19 ENCOUNTER — Other Ambulatory Visit (HOSPITAL_COMMUNITY)
Admission: RE | Admit: 2018-04-19 | Discharge: 2018-04-19 | Disposition: A | Discharge status: Home / Self Care | Payer: 59 | Source: Ambulatory Visit | Attending: Family Medicine | Admitting: Family Medicine

## 2018-04-19 ENCOUNTER — Other Ambulatory Visit: Payer: Self-pay

## 2018-04-19 VITALS — BP 130/84 | HR 85 | Temp 98.2°F | Ht 64.0 in | Wt 190.0 lb

## 2018-04-19 DIAGNOSIS — E559 Vitamin D deficiency, unspecified: Secondary | ICD-10-CM | POA: Diagnosis not present

## 2018-04-19 DIAGNOSIS — Z9189 Other specified personal risk factors, not elsewhere classified: Secondary | ICD-10-CM | POA: Diagnosis not present

## 2018-04-19 DIAGNOSIS — I10 Essential (primary) hypertension: Secondary | ICD-10-CM

## 2018-04-19 DIAGNOSIS — Z7251 High risk heterosexual behavior: Secondary | ICD-10-CM | POA: Insufficient documentation

## 2018-04-19 DIAGNOSIS — E7800 Pure hypercholesterolemia, unspecified: Secondary | ICD-10-CM

## 2018-04-19 DIAGNOSIS — R7303 Prediabetes: Secondary | ICD-10-CM

## 2018-04-19 LAB — POCT GLYCOSYLATED HEMOGLOBIN (HGB A1C): HEMOGLOBIN A1C: 6.1 % — AB (ref 4.0–5.6)

## 2018-04-19 MED ORDER — VITAMIN D (ERGOCALCIFEROL) 1.25 MG (50000 UNIT) PO CAPS: 50000.0000 [IU] | ORAL_CAPSULE | ORAL | 0 refills | Status: DC

## 2018-04-19 MED ORDER — HYDROCHLOROTHIAZIDE 25 MG PO TABS: 25.0000 mg | ORAL_TABLET | Freq: Every day | ORAL | 3 refills | Status: DC

## 2018-04-19 MED ORDER — METFORMIN HCL 500 MG PO TABS: 500.0000 mg | ORAL_TABLET | Freq: Two times a day (BID) | ORAL | 1 refills | Status: DC

## 2018-04-19 NOTE — Patient Instructions (Addendum)
Your blood pressure looks good.  Keep taking your antihypertensive medications.  We are checking your LDL cholesterol today.  We can see how well your Red Yeast Rice is working to lower it.   We are checking you for infection from Gonorrhea, Chlamydia, HIV and Syphilis.    We are checking your vitamin d level to see if you are out of the vitamin d deficiency state having used the weekly high-dose vitamin D.

## 2018-04-20 LAB — VITAMIN D 25 HYDROXY (VIT D DEFICIENCY, FRACTURES): Vit D, 25-Hydroxy: 34.5 ng/mL (ref 30.0–100.0)

## 2018-04-20 LAB — HIV ANTIBODY (ROUTINE TESTING W REFLEX): HIV Screen 4th Generation wRfx: NONREACTIVE

## 2018-04-20 LAB — LDL CHOLESTEROL, DIRECT: LDL Direct: 190 mg/dL — ABNORMAL HIGH (ref 0–99)

## 2018-04-20 LAB — RPR: RPR Ser Ql: NONREACTIVE

## 2018-04-23 ENCOUNTER — Encounter: Payer: Self-pay | Admitting: Family Medicine

## 2018-04-23 ENCOUNTER — Ambulatory Visit (INDEPENDENT_AMBULATORY_CARE_PROVIDER_SITE_OTHER): Payer: 59 | Admitting: Family Medicine

## 2018-04-23 ENCOUNTER — Encounter (INDEPENDENT_AMBULATORY_CARE_PROVIDER_SITE_OTHER): Payer: Self-pay | Admitting: Family Medicine

## 2018-04-23 VITALS — BP 131/82 | HR 80 | Temp 97.8°F | Ht 64.0 in | Wt 183.0 lb

## 2018-04-23 DIAGNOSIS — E559 Vitamin D deficiency, unspecified: Secondary | ICD-10-CM | POA: Diagnosis not present

## 2018-04-23 DIAGNOSIS — Z6831 Body mass index (BMI) 31.0-31.9, adult: Secondary | ICD-10-CM | POA: Diagnosis not present

## 2018-04-23 DIAGNOSIS — E669 Obesity, unspecified: Secondary | ICD-10-CM | POA: Diagnosis not present

## 2018-04-23 DIAGNOSIS — Z7251 High risk heterosexual behavior: Secondary | ICD-10-CM | POA: Insufficient documentation

## 2018-04-23 LAB — URINE CYTOLOGY ANCILLARY ONLY
Chlamydia: NEGATIVE
Neisseria Gonorrhea: NEGATIVE
Trichomonas: NEGATIVE

## 2018-04-23 MED ORDER — VITAMIN D (ERGOCALCIFEROL) 1.25 MG (50000 UNIT) PO CAPS: 50000.0000 [IU] | ORAL_CAPSULE | ORAL | 0 refills | Status: DC

## 2018-04-23 NOTE — Assessment & Plan Note (Signed)
Adequate blood pressure control.  No evidence of new end organ damage.  Tolerating medication without significant adverse effects.  Plan to continue current blood HCTZ pressure regiment.

## 2018-04-23 NOTE — Assessment & Plan Note (Signed)
New problem New physical manifestations of STD (+) further workup with urine GC/Chlamydia, serum HIV and PRP pending.

## 2018-04-23 NOTE — Assessment & Plan Note (Signed)
Lab Results  Component Value Date   LDLCALC 175 (H) 12/18/2017   Last LDL direct 190 (04/19/18) Patient meets LDL criteria for primary CV event prevention with statin.  Will discuss with patient.

## 2018-04-23 NOTE — Assessment & Plan Note (Signed)
25(OH)Vit D level 35, improved after weekly ergocalciferol.  Ms Cancro prefers weekly dosing of vitamin D. Plan: Continue Ergocalciferol 50, 000 units monthly.

## 2018-04-23 NOTE — Progress Notes (Signed)
   Subjective:    Patient ID: Melanie Cole, female    DOB: 03/18/1966, 53 y.o.   MRN: [id number]  HPI CHRONIC HYPERTENSION  Disease Monitoring  Blood pressure range: recent 145/90  Chest pain: no   Dyspnea: no   Claudication: no   Medication compliance: yes, Diltiazem XT 240 mg daily and HCTZ 25 Medication Side Effects  Lightheadedness: yes, occasionally   Urinary frequency: no   Edema: mild at end of day in both feet  Salt Restriction: No shaken salt.   Obesity Participating with [facility] Trial of metformin for prediabetes by physician at weight program.  She ran out of med two weeks ago.   Recent relapse in wieght from stress eating.  Stress from marriage. Able to perform all ADLs and iADLs, feels sluggish No new knee pain, no polyuria/polydipsia   Urinary incontinence (+)   High Risk Sexual exposure - recent exposure.  Pt concerned that her husband has had extramarital sexual relations. - Pt has had unprotected sexual intercourse with her husband - No vaginal DC, no vaginal lesions, no rashes - (+) stress on patient from possible infidelity  SH: No smoking Review of Systems See HPI    Objective:   Physical Exam Vitals:   04/19/18 1555  BP: 130/84  Pulse: 85  Temp: 98.2 F (36.8 C)  SpO2: 99%   Wt Readings from Last 3 Encounters:  04/23/18 183 lb (83 kg)  04/19/18 190 lb (86.2 kg)  03/12/18 185 lb (83.9 kg)  Gen: NAD, groomed HEENT: normal pinna and eac, translucent TMs with LR(+), papable thyroid without nodularity Cor: RRR, no M/G/R Lung: BCTA, no acc mm use  Ext: no edema, (+) palpable pedal pulses bilaterally Psych: euthymic, speech clear and prosodic, language concrete and purposeful Gait: normal swing and stance, no crossing midline, normal gait speed    Assessment & Plan:  Visit Problem List with A/P  No problem-specific Assessment & Plan notes found for this encounter.

## 2018-04-23 NOTE — Assessment & Plan Note (Addendum)
Lab Results  Component Value Date   HGBA1C 6.1 (A) 04/19/2018  Estblished condition.  Stable. [facility] has had patient on metformin.   Prescribed month supply of metformin to last until patient sees her wellness physician to discuss ongoing use of metformin.   Will defer to her wellness clinic physicians in prescribing metformin for at-risk state

## 2018-04-24 NOTE — Progress Notes (Signed)
Office: [phone]  /  Fax: [phone]   HPI:   Chief Complaint: OBESITY Melanie Cole is here to discuss her progress with her obesity treatment plan. She is on the Category 3 plan and is following her eating plan approximately 0 % of the time. She states she is exercising 0 minutes 0 times per week. Reginas last visit was 03/12/18. She has been off track totally since her last visit. She is currently fasting for church through February 2nd (no meat, sweets or sodas). Her weight is 183 lb (83 kg) today and has had a weight loss of 2 pounds over a period of 6 weeks since her last visit. She has lost 11 lbs since starting treatment with [place].  Vitamin D deficiency Melanie Cole has a diagnosis of vitamin D deficiency. Melanie Cole is currently taking vit D and she is not at goal. She denies nausea, vomiting or muscle weakness.  ASSESSMENT AND PLAN:  Vitamin D deficiency  Class 1 obesity with serious comorbidity and body mass index (BMI) of 31.0 to 31.9 in adult, unspecified obesity type  PLAN:  Vitamin D Deficiency Melanie Cole was informed that low vitamin D levels contributes to fatigue and are associated with obesity, breast, and colon cancer. She agrees to continue to take prescription Vit D @50 ,000 IU every week and will follow up for routine testing of vitamin D, at least 2-3 times per year. She was informed of the risk of over-replacement of vitamin D and agrees to not increase her dose unless she discusses this with [place] first.  I spent > than 50% of the 15 minute visit on counseling as documented in the note.  Obesity Melanie Cole is currently in the action stage of change. As such, her goal is to continue with weight loss efforts She has agreed to follow the Category 3 plan We discussed the following Behavioral Modification Strategies today: increasing lean protein intake and planning for success.   Melanie Cole has not been prescribed exercise at this time.  We discussed adding protein (plant based) while  fasting. Handout for protein content was provided to patient today.  Melanie Cole has agreed to follow up with our clinic in 3 to 4 weeks. She was informed of the importance of frequent follow up visits to maximize her success with intensive lifestyle modifications for her multiple health conditions.  ALLERGIES: Allergies  Allergen Reactions  . Ace Inhibitors     REACTION: Atopic rash  . Amlodipine Besylate Other (See Comments)    Peripheral edema and dry mouth.     MEDICATIONS: Current Outpatient Medications on File Prior to Visit  Medication Sig Dispense Refill  . Coenzyme Q10 (CO Q-10) 300 MG CAPS Take 1 tablet by mouth daily. 30 each 0  . hydrochlorothiazide (HYDRODIURIL) 25 MG tablet Take 1 tablet (25 mg total) by mouth daily. 90 tablet 3  . MELATONIN ER PO Take 1 capsule by mouth daily.    . metFORMIN (GLUCOPHAGE) 500 MG tablet Take 1 tablet (500 mg total) by mouth 2 (two) times daily with a meal. 60 tablet 1  . Vitamin D, Ergocalciferol, (DRISDOL) 1.25 MG (50000 UT) CAPS capsule Take 1 capsule (50,000 Units total) by mouth every 30 (thirty) days. 12 capsule 0   No current facility-administered medications on file prior to visit.     PAST MEDICAL HISTORY: Past Medical History:  Diagnosis Date  . Adenomyosis   . Atopic dermatitis 12/18/2012  . Chest pain, unspecified 12/16/2013  . Constipation   . Essential hypertension, benign   .  GERD (gastroesophageal reflux disease)   . Hemangioma of liver 09/10/2010   09/09/10 Abdominopelvic CT found incidental 1.8 cm round mass in the left lobe of the liver which is likely a solitary cavernous hemangioma    . High blood pressure   . High cholesterol   . History of gestational diabetes   . History of postmenopausal bleeding 12/16/2013  . History of pulmonary function tests 2002   Normal values.  . Hypertension   . IBS (irritable bowel syndrome)   . Lesion of liver 02/13/2015  . Liver problem   . MIGRAINE, UNSPEC., W/O INTRACTABLE MIGRAINE  06/01/2006       . Mondor's disease   . Patellar tendinitis/bursitis 12/18/2012  . Postmenopause   . Pre-diabetes   . Urinary, incontinence, stress female 04/08/2014  . Urinary, incontinence, stress female 04/08/2014  . Uterus, adenomyosis    Per patient's recall  . Vasomotor flushing 12/16/2013  . Vitamin D deficiency     PAST SURGICAL HISTORY: Past Surgical History:  Procedure Laterality Date  . Bladder "Stretched"    . CARDIOVASCULAR STRESS TEST  12/03/2000   Stress Echocargiogram - 12/03/2000, NORMAL  . COLONOSCOPY  05/05/2000    Dr Lyndel Safe [facility] Alert)- Internal Hemorrhoids  . SPIROMETRY  12/03/2000   Normal Pulmonary Function Testing   . TUBAL LIGATION      SOCIAL HISTORY: Social History   Tobacco Use  . Smoking status: Never Smoker  . Smokeless tobacco: Never Used  Substance Use Topics  . Alcohol use: No  . Drug use: No    FAMILY HISTORY: Family History  Problem Relation Age of Onset  . Pancreatic cancer Father        Died age 35  . Arrhythmia Father        Required a permanent pacemaker  . Diabetes Father   . Colon cancer Cousin        Colorectal Cancer  . Colon cancer Unknown        Aunt with colon cancer  . Diabetes Mother   . Thyroid disease Mother   . High blood pressure Mother   . High Cholesterol Mother   . Cancer Unknown        Pancreas, unknown relative    ROS: Review of Systems  Constitutional: Positive for weight loss.  Gastrointestinal: Negative for nausea and vomiting.  Musculoskeletal:       Negative for muscle weakness    PHYSICAL EXAM: Blood pressure 131/82, pulse 80, temperature 97.8 F (36.6 C), temperature source Oral, height 5\' 4"  (1.626 m), weight 183 lb (83 kg), last menstrual period 11/16/2012, SpO2 94 %. Body mass index is 31.41 kg/m. Physical Exam Vitals signs reviewed.  Constitutional:      Appearance: Normal appearance. She is well-developed. She is obese.  Cardiovascular:     Rate and Rhythm: Normal rate.  Pulmonary:      Effort: Pulmonary effort is normal.  Musculoskeletal: Normal range of motion.  Skin:    General: Skin is warm and dry.  Neurological:     Mental Status: She is alert and oriented to person, place, and time.  Psychiatric:        Mood and Affect: Mood normal.        Behavior: Behavior normal.     RECENT LABS AND TESTS: BMET    Component Value Date/Time   NA 142 12/18/2017 1245   K 4.0 12/18/2017 1245   CL 100 12/18/2017 1245   CO2 25 12/18/2017 1245   GLUCOSE 88  12/18/2017 1245   GLUCOSE 90 12/10/2015 0951   BUN 15 12/18/2017 1245   CREATININE 0.63 12/18/2017 1245   CREATININE 0.60 12/10/2015 0951   CALCIUM 9.5 12/18/2017 1245   GFRNONAA 104 12/18/2017 1245   GFRNONAA >89 12/10/2015 0951   GFRAA 120 12/18/2017 1245   GFRAA >89 12/10/2015 0951   Lab Results  Component Value Date   HGBA1C 6.1 (A) 04/19/2018   HGBA1C 6.4 (H) 12/18/2017   HGBA1C 6.2 11/10/2016   HGBA1C 5.9 12/10/2015   HGBA1C 5.7 02/12/2015   Lab Results  Component Value Date   INSULIN 11.7 12/18/2017   CBC    Component Value Date/Time   WBC 6.1 12/18/2017 1245   WBC 5.5 12/10/2015 0951   RBC 4.74 12/18/2017 1245   RBC 4.95 12/10/2015 0951   HGB 14.3 12/18/2017 1245   HCT 43.1 12/18/2017 1245   PLT 326 12/10/2015 0951   MCV 91 12/18/2017 1245   MCH 30.2 12/18/2017 1245   MCH 31.1 12/10/2015 0951   MCHC 33.2 12/18/2017 1245   MCHC 34.4 12/10/2015 0951   RDW 13.0 12/18/2017 1245   LYMPHSABS 2.8 12/18/2017 1245   MONOABS 330 12/10/2015 0951   EOSABS 0.1 12/18/2017 1245   BASOSABS 0.0 12/18/2017 1245   Iron/TIBC/Ferritin/ %Sat No results found for: IRON, TIBC, FERRITIN, IRONPCTSAT Lipid Panel     Component Value Date/Time   CHOL 262 (H) 12/18/2017 1245   TRIG 94 12/18/2017 1245   HDL 68 12/18/2017 1245   CHOLHDL 4.8 10/27/2017 1020   VLDL 15 10/27/2017 1020   LDLCALC 175 (H) 12/18/2017 1245   LDLDIRECT 190 (H) 04/19/2018 1649   Hepatic Function Panel     Component Value Date/Time     PROT 7.4 12/18/2017 1245   ALBUMIN 4.6 12/18/2017 1245   AST 21 12/18/2017 1245   ALT 20 12/18/2017 1245   ALKPHOS 74 12/18/2017 1245   BILITOT 0.7 12/18/2017 1245   BILIDIR 0.2 02/22/2008      Component Value Date/Time   TSH 0.554 12/18/2017 1245   TSH 0.71 12/10/2015 0951   TSH 0.717 03/05/2012 1700     Ref. Range 04/19/2018 16:49  Vitamin D, 25-Hydroxy Latest Ref Range: 30.0 - 100.0 ng/mL 34.5     OBESITY BEHAVIORAL INTERVENTION VISIT  Today's visit was # 6   Starting weight: 194 lbs Starting date: 12/18/2017 Today's weight : 183 lbs  Today's date: 04/23/2018 Total lbs lost to date: 20   ASK: We discussed the diagnosis of obesity with Melanie Cole today and Melanie Cole agreed to give [place] permission to discuss obesity behavioral modification therapy today.  ASSESS: Melanie Cole has the diagnosis of obesity and her BMI today is 31.4 Melanie Cole is in the action stage of change   ADVISE: Melanie Cole was educated on the multiple health risks of obesity as well as the benefit of weight loss to improve her health. She was advised of the need for long term treatment and the importance of lifestyle modifications to improve her current health and to decrease her risk of future health problems.  AGREE: Multiple dietary modification options and treatment options were discussed and  Melanie Cole agreed to follow the recommendations documented in the above note.  ARRANGE: Melanie Cole was educated on the importance of frequent visits to treat obesity as outlined per CMS and USPSTF guidelines and agreed to schedule her next follow up appointment today.  Corey Skains, am acting as Location manager for [employer], FNP-C.  I have reviewed the above documentation for  accuracy and completeness, and I agree with the above.  - Antawan Mchugh, FNP-C.

## 2018-04-25 ENCOUNTER — Encounter (INDEPENDENT_AMBULATORY_CARE_PROVIDER_SITE_OTHER): Payer: Self-pay | Admitting: Family Medicine

## 2018-05-24 ENCOUNTER — Encounter (INDEPENDENT_AMBULATORY_CARE_PROVIDER_SITE_OTHER): Payer: Self-pay

## 2018-05-24 ENCOUNTER — Ambulatory Visit (INDEPENDENT_AMBULATORY_CARE_PROVIDER_SITE_OTHER): Payer: 59 | Admitting: Family Medicine

## 2018-06-11 ENCOUNTER — Ambulatory Visit (INDEPENDENT_AMBULATORY_CARE_PROVIDER_SITE_OTHER): Payer: 59 | Admitting: Family Medicine

## 2018-06-11 ENCOUNTER — Encounter (INDEPENDENT_AMBULATORY_CARE_PROVIDER_SITE_OTHER): Payer: Self-pay | Admitting: Family Medicine

## 2018-06-11 VITALS — BP 158/96 | HR 76 | Temp 97.4°F | Ht 64.0 in | Wt 190.0 lb

## 2018-06-11 DIAGNOSIS — E559 Vitamin D deficiency, unspecified: Secondary | ICD-10-CM

## 2018-06-11 DIAGNOSIS — Z6832 Body mass index (BMI) 32.0-32.9, adult: Secondary | ICD-10-CM

## 2018-06-11 DIAGNOSIS — R7303 Prediabetes: Secondary | ICD-10-CM

## 2018-06-11 DIAGNOSIS — Z9189 Other specified personal risk factors, not elsewhere classified: Secondary | ICD-10-CM

## 2018-06-11 DIAGNOSIS — F3289 Other specified depressive episodes: Secondary | ICD-10-CM

## 2018-06-11 DIAGNOSIS — E669 Obesity, unspecified: Secondary | ICD-10-CM

## 2018-06-11 DIAGNOSIS — E7849 Other hyperlipidemia: Secondary | ICD-10-CM | POA: Diagnosis not present

## 2018-06-11 MED ORDER — VITAMIN D (ERGOCALCIFEROL) 1.25 MG (50000 UNIT) PO CAPS: 50000.0000 [IU] | ORAL_CAPSULE | ORAL | 0 refills | Status: DC

## 2018-06-11 NOTE — Progress Notes (Signed)
Office: [phone]  /  Fax: [phone]   HPI:   Chief Complaint: OBESITY Melanie Cole is here to discuss her progress with her obesity treatment plan. She is on the Category 3 plan and is following her eating plan approximately 0 % of the time. She states she is on the elliptical for 30 minutes 3 times per week. Melanie Cole has been off of the plan for a month and also fasted recently for church.. She notes increased stress eating. She plans to restart meal plan tomorrow.  Her weight is 190 lb (86.2 kg) today and has gained 7 pounds since her last visit. She has lost 4 lbs since starting treatment with [place].  Vitamin D Deficiency Melanie Cole has a diagnosis of vitamin D deficiency. She is currently taking prescription Vit D. She would like to get Vit D level up to 50 as we recommended. Last Vit D level was 34.5 on 04/19/2018. She denies nausea, vomiting or muscle weakness.  Pre-Diabetes Melanie Cole has a diagnosis of pre-diabetes based on her elevated Hgb A1c and was informed this puts her at greater risk of developing diabetes. She is only getting in 1 dose of metformin. She has food cravings throughout the days. She continues to work on diet and exercise to decrease risk of diabetes. She denies nausea or hypoglycemia.  At risk for diabetes Melanie Cole is at higher than average risk for developing diabetes due to her obesity and pre-diabetes. She currently denies polyuria or polydipsia.  Hyperlipidemia Melanie Cole has hyperlipidemia and has been trying to improve her cholesterol levels with intensive lifestyle modification including a low saturated fat diet, exercise and weight loss. We discussed starting high intensity statin as recommended by her primary care physician for LDL of 190. She will think about this, and she will start taking 400 mg of CoQ10 to help prevent myalgias. She denies any chest pain, claudication or myalgias. The 10-year ASCVD risk score Melanie Cole DC Melanie Cole., et al., 2013) is: 8.7%   Values used to  calculate the score:     Age: 53 years     Sex: Female     Is Non-Hispanic African American: Yes     Diabetic: No     Tobacco smoker: No     Systolic Blood Pressure: 867 mmHg     Is BP treated: Yes     HDL Cholesterol: 68 mg/dL     Total Cholesterol: 262 mg/dL   Depression with emotional eating behaviors Melanie Cole is struggles with emotional eating. She has increased stress secondary to marital issues. Her blood pressure is elevated today. We discussed Topamax but she would like to start Wellbutrin for help with depression as well as cravings. Melanie Cole is using food for comfort to the extent that it is negatively impacting her health. She often snacks when she is not hungry. Melanie Cole sometimes feels she is out of control and then feels guilty that she made poor food choices. She has been working on behavior modification techniques to help reduce her emotional eating and has been somewhat successful. She shows no sign of suicidal or homicidal ideations.  Depression screen [name] 2/9 04/19/2018 12/18/2017 12/22/2016 11/10/2016 12/10/2015  Decreased Interest 1 3 0 0 0  Down, Depressed, Hopeless - 3 0 0 0  PHQ - 2 Score 1 6 0 0 0  Altered sleeping - 3 - - -  Tired, decreased energy - 3 - - -  Change in appetite - 2 - - -  Feeling bad or failure about yourself  -  2 - - -  Trouble concentrating - 2 - - -  Moving slowly or fidgety/restless - 0 - - -  Suicidal thoughts - 0 - - -  PHQ-9 Score - 18 - - -  Difficult doing work/chores - Not difficult at all - - -    ASSESSMENT AND PLAN:  Prediabetes  Vitamin D insufficiency - Plan: Vitamin D, Ergocalciferol, (DRISDOL) 1.25 MG (50000 UT) CAPS capsule  Other hyperlipidemia  Other depression - with emotional eating  At risk for diabetes mellitus  Class 1 obesity with serious comorbidity and body mass index (BMI) of 32.0 to 32.9 in adult, unspecified obesity type  PLAN:  Vitamin D Deficiency Melanie Cole was informed that low vitamin D levels contributes to  fatigue and are associated with obesity, breast, and colon cancer. Melanie Cole agrees to change prescription Vit D dose to 50,000 IU every week #4 with no refills with a goal of vitamin D level of 50. Melanie Cole She will follow up for routine testing of vitamin D, at least 2-3 times per year. She was informed of the risk of over-replacement of vitamin D and agrees to not increase her dose unless she discusses this with [place] first. We will recheck Vit D level in 2 to 3 months. Melanie Cole agrees to follow up with our clinic in 3 weeks.  Pre-Diabetes Melanie Cole will continue to work on weight loss, exercise, and decreasing simple carbohydrates in her diet to help decrease the risk of diabetes.  Melanie Cole agrees to continue taking metformin and she is to take at breakfast and lunch. Melanie Cole agrees to follow up with our clinic in 3 weeks as directed to monitor her progress.  Diabetes risk counseling Melanie Cole was given extended (15 minutes) diabetes prevention counseling today. She is 53 y.o. female and has risk factors for diabetes including obesity and pre-diabetes. We discussed intensive lifestyle modifications today with an emphasis on weight loss as well as increasing exercise and decreasing simple carbohydrates in her diet.  Hyperlipidemia Melanie Cole was informed of the American Heart Association Guidelines emphasizing intensive lifestyle modifications as the first line treatment for hyperlipidemia. We discussed many lifestyle modifications today in depth, and Melanie Cole will continue to work on decreasing saturated fats such as fatty red meat, butter and many fried foods. She will also increase vegetables and lean protein in her diet and continue to work on exercise and weight loss efforts. We will discuss starting high intensity statin at next visit. She plans to start CoQ10 400 mg daily to reduce risk of myalgias. Melanie Cole agrees to follow up with our clinic in 3 weeks.  Depression with Emotional Eating Behaviors We discussed behavior  modification techniques today to help Bea deal with her emotional eating and depression. We will monitor blood pressure at next visit and will start Wellbutrin if her blood pressure is low. Anyjah agrees to follow up with our clinic in 3 weeks.  Obesity Brynnlie is currently in the action stage of change. As such, her goal is to continue with weight loss efforts She has agreed to follow the Category 3 plan with added breakfast and lunch options Lysandra will continue current exercise regimen for weight loss and overall health benefits. We discussed the following Behavioral Modification Strategies today: increasing lean protein intake, decreasing simple carbohydrates, dealing with family or coworker sabotage, keeping healthy foods in the home, and planning for success   Aibhlinn has agreed to follow up with our clinic in 3 weeks. She was informed of the importance of frequent follow  up visits to maximize her success with intensive lifestyle modifications for her multiple health conditions.  ALLERGIES: Allergies  Allergen Reactions  . Ace Inhibitors     REACTION: Atopic rash  . Amlodipine Besylate Other (See Comments)    Peripheral edema and dry mouth.     MEDICATIONS: Current Outpatient Medications on File Prior to Visit  Medication Sig Dispense Refill  . Coenzyme Q10 (CO Q-10) 300 MG CAPS Take 1 tablet by mouth daily. 30 each 0  . hydrochlorothiazide (HYDRODIURIL) 25 MG tablet Take 1 tablet (25 mg total) by mouth daily. 90 tablet 3  . MELATONIN ER PO Take 1 capsule by mouth daily.    . metFORMIN (GLUCOPHAGE) 500 MG tablet Take 1 tablet (500 mg total) by mouth 2 (two) times daily with a meal. 60 tablet 1   No current facility-administered medications on file prior to visit.     PAST MEDICAL HISTORY: Past Medical History:  Diagnosis Date  . Adenomyosis   . Atopic dermatitis 12/18/2012  . Chest pain, unspecified 12/16/2013  . Constipation   . Essential hypertension, benign   . GERD  (gastroesophageal reflux disease)   . Hemangioma of liver 09/10/2010   09/09/10 Abdominopelvic CT found incidental 1.8 cm round mass in the left lobe of the liver which is likely a solitary cavernous hemangioma    . High blood pressure   . High cholesterol   . History of gestational diabetes   . History of postmenopausal bleeding 12/16/2013  . History of pulmonary function tests 2002   Normal values.  . Hypertension   . IBS (irritable bowel syndrome)   . Lesion of liver 02/13/2015  . Liver problem   . MIGRAINE, UNSPEC., W/O INTRACTABLE MIGRAINE 06/01/2006       . Mondor's disease   . Patellar tendinitis/bursitis 12/18/2012  . Postmenopause   . Pre-diabetes   . Urinary, incontinence, stress female 04/08/2014  . Urinary, incontinence, stress female 04/08/2014  . Uterus, adenomyosis    Per patient's recall  . Vasomotor flushing 12/16/2013  . Vitamin D deficiency     PAST SURGICAL HISTORY: Past Surgical History:  Procedure Laterality Date  . Bladder "Stretched"    . CARDIOVASCULAR STRESS TEST  12/03/2000   Stress Echocargiogram - 12/03/2000, NORMAL  . COLONOSCOPY  05/05/2000    Dr Lyndel Safe Tia Alert)- Internal Hemorrhoids  . SPIROMETRY  12/03/2000   Normal Pulmonary Function Testing   . TUBAL LIGATION      SOCIAL HISTORY: Social History   Tobacco Use  . Smoking status: Never Smoker  . Smokeless tobacco: Never Used  Substance Use Topics  . Alcohol use: No  . Drug use: No    FAMILY HISTORY: Family History  Problem Relation Age of Onset  . Pancreatic cancer Father        Died age 34  . Arrhythmia Father        Required a permanent pacemaker  . Diabetes Father   . Colon cancer Cousin        Colorectal Cancer  . Colon cancer Unknown        Aunt with colon cancer  . Diabetes Mother   . Thyroid disease Mother   . High blood pressure Mother   . High Cholesterol Mother   . Cancer Unknown        Pancreas, unknown relative    ROS: Review of Systems  Constitutional: Negative for  weight loss.  Cardiovascular: Negative for chest pain and claudication.  Gastrointestinal: Negative for nausea and  vomiting.  Genitourinary: Negative for frequency.  Musculoskeletal: Negative for myalgias.       Negative muscle weakness  Endo/Heme/Allergies: Negative for polydipsia.       Negative hypoglycemia  Psychiatric/Behavioral: Positive for depression. Negative for suicidal ideas.    PHYSICAL EXAM: Blood pressure (!) 158/96, pulse 76, temperature (!) 97.4 F (36.3 C), height 5\' 4"  (1.626 m), weight 190 lb (86.2 kg), last menstrual period 11/16/2012, SpO2 98 %. Body mass index is 32.61 kg/m. Physical Exam Vitals signs reviewed.  Constitutional:      Appearance: Normal appearance. She is obese.  Cardiovascular:     Rate and Rhythm: Normal rate.     Pulses: Normal pulses.  Pulmonary:     Effort: Pulmonary effort is normal.     Breath sounds: Normal breath sounds.  Musculoskeletal: Normal range of motion.  Skin:    General: Skin is warm and dry.  Neurological:     Mental Status: She is alert and oriented to person, place, and time.  Psychiatric:        Mood and Affect: Mood normal.        Behavior: Behavior normal.     RECENT LABS AND TESTS: BMET    Component Value Date/Time   NA 142 12/18/2017 1245   K 4.0 12/18/2017 1245   CL 100 12/18/2017 1245   CO2 25 12/18/2017 1245   GLUCOSE 88 12/18/2017 1245   GLUCOSE 90 12/10/2015 0951   BUN 15 12/18/2017 1245   CREATININE 0.63 12/18/2017 1245   CREATININE 0.60 12/10/2015 0951   CALCIUM 9.5 12/18/2017 1245   GFRNONAA 104 12/18/2017 1245   GFRNONAA >89 12/10/2015 0951   GFRAA 120 12/18/2017 1245   GFRAA >89 12/10/2015 0951   Lab Results  Component Value Date   HGBA1C 6.1 (A) 04/19/2018   HGBA1C 6.4 (H) 12/18/2017   HGBA1C 6.2 11/10/2016   HGBA1C 5.9 12/10/2015   HGBA1C 5.7 02/12/2015   Lab Results  Component Value Date   INSULIN 11.7 12/18/2017   CBC    Component Value Date/Time   WBC 6.1 12/18/2017  1245   WBC 5.5 12/10/2015 0951   RBC 4.74 12/18/2017 1245   RBC 4.95 12/10/2015 0951   HGB 14.3 12/18/2017 1245   HCT 43.1 12/18/2017 1245   PLT 326 12/10/2015 0951   MCV 91 12/18/2017 1245   MCH 30.2 12/18/2017 1245   MCH 31.1 12/10/2015 0951   MCHC 33.2 12/18/2017 1245   MCHC 34.4 12/10/2015 0951   RDW 13.0 12/18/2017 1245   LYMPHSABS 2.8 12/18/2017 1245   MONOABS 330 12/10/2015 0951   EOSABS 0.1 12/18/2017 1245   BASOSABS 0.0 12/18/2017 1245   Iron/TIBC/Ferritin/ %Sat No results found for: IRON, TIBC, FERRITIN, IRONPCTSAT Lipid Panel     Component Value Date/Time   CHOL 262 (H) 12/18/2017 1245   TRIG 94 12/18/2017 1245   HDL 68 12/18/2017 1245   CHOLHDL 4.8 10/27/2017 1020   VLDL 15 10/27/2017 1020   LDLCALC 175 (H) 12/18/2017 1245   LDLDIRECT 190 (H) 04/19/2018 1649   Hepatic Function Panel     Component Value Date/Time   PROT 7.4 12/18/2017 1245   ALBUMIN 4.6 12/18/2017 1245   AST 21 12/18/2017 1245   ALT 20 12/18/2017 1245   ALKPHOS 74 12/18/2017 1245   BILITOT 0.7 12/18/2017 1245   BILIDIR 0.2 02/22/2008      Component Value Date/Time   TSH 0.554 12/18/2017 1245   TSH 0.71 12/10/2015 0951   TSH 0.717 03/05/2012  [place] VISIT  Today's visit was # 7   Starting weight: 194 lbs Starting date: 12/18/17 Today's weight : 190 lbs  Today's date: 06/11/2018 Total lbs lost to date: 4    06/11/2018  Height 5\' 4"  (1.626 m)  Weight 190 lb (86.2 kg)  BMI (Calculated) 32.6  BLOOD PRESSURE - SYSTOLIC 165  BLOOD PRESSURE - DIASTOLIC 96   Body Fat % 79.0 %  Total Body Water (lbs) 74.8 lbs     ASK: We discussed the diagnosis of obesity with Herbie Drape today and Gina agreed to give [place] permission to discuss obesity behavioral modification therapy today.  ASSESS: Annlee has the diagnosis of obesity and her BMI today is 32.6 Dnya is in the action stage of change   ADVISE: Rosebud was educated on the multiple health  risks of obesity as well as the benefit of weight loss to improve her health. She was advised of the need for long term treatment and the importance of lifestyle modifications to improve her current health and to decrease her risk of future health problems.  AGREE: Multiple dietary modification options and treatment options were discussed and  Vonceil agreed to follow the recommendations documented in the above note.  ARRANGE: Dioselina was educated on the importance of frequent visits to treat obesity as outlined per CMS and USPSTF guidelines and agreed to schedule her next follow up appointment today.  Wilhemena Durie, am acting as Location manager for [employer], FNP-C.  I have reviewed the above documentation for accuracy and completeness, and I agree with the above.  - Rolonda Pontarelli, FNP-C.

## 2018-06-13 ENCOUNTER — Encounter (INDEPENDENT_AMBULATORY_CARE_PROVIDER_SITE_OTHER): Payer: Self-pay | Admitting: Family Medicine

## 2018-06-13 DIAGNOSIS — E1169 Type 2 diabetes mellitus with other specified complication: Secondary | ICD-10-CM | POA: Insufficient documentation

## 2018-06-13 DIAGNOSIS — E7849 Other hyperlipidemia: Secondary | ICD-10-CM | POA: Insufficient documentation

## 2018-06-13 DIAGNOSIS — R7303 Prediabetes: Secondary | ICD-10-CM | POA: Insufficient documentation

## 2018-06-26 ENCOUNTER — Encounter (INDEPENDENT_AMBULATORY_CARE_PROVIDER_SITE_OTHER): Payer: Self-pay

## 2018-07-05 ENCOUNTER — Ambulatory Visit (INDEPENDENT_AMBULATORY_CARE_PROVIDER_SITE_OTHER): Payer: 59 | Admitting: Family Medicine

## 2018-07-09 IMAGING — US US ABDOMEN LIMITED
1 series · 14 of 25 positions shown · non-contrast
Comparison: 03/05/2015

CLINICAL DATA: Follow-up hemangiomas

EXAM:
US ABDOMEN LIMITED - RIGHT UPPER QUADRANT

[Series 1: us abdomen limited · 0.19mm/px · 14 of 73 slices shown]
[im 1/73]
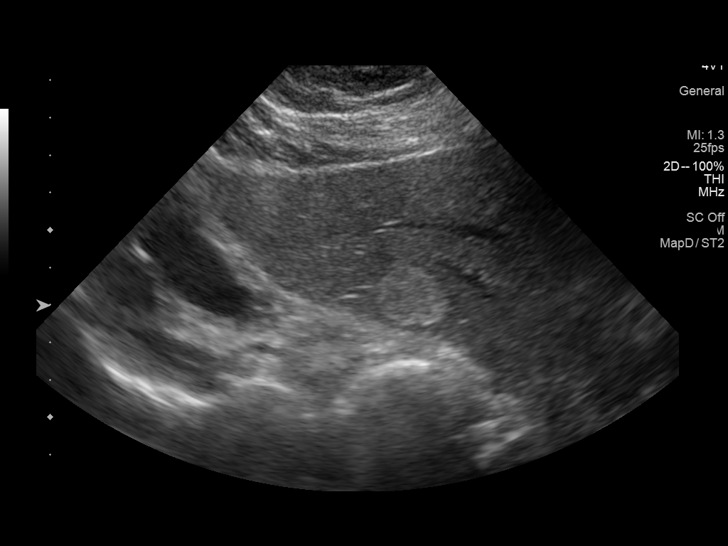
[im 7/73]
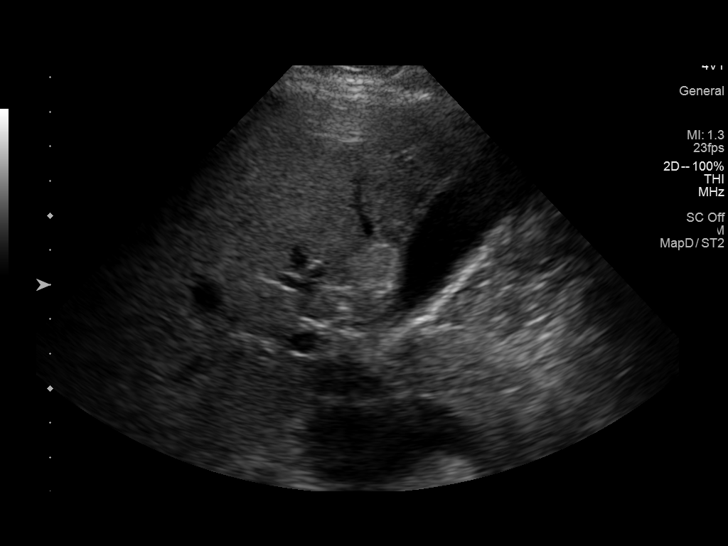
[im 13/73]
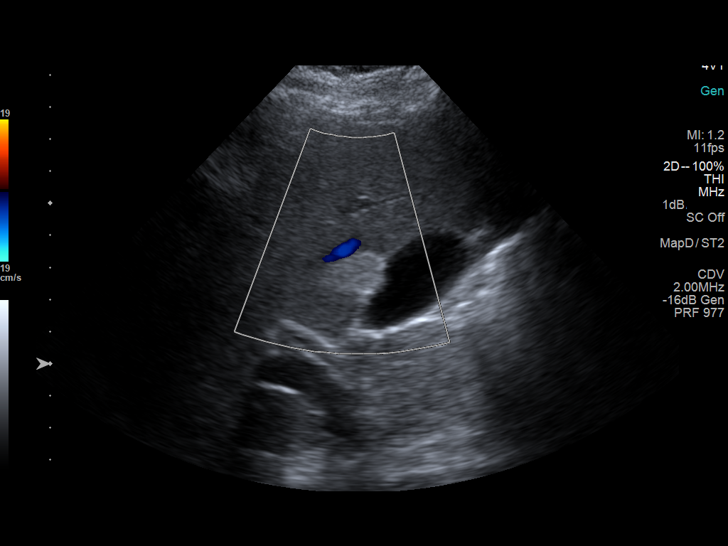
[im 19/73]
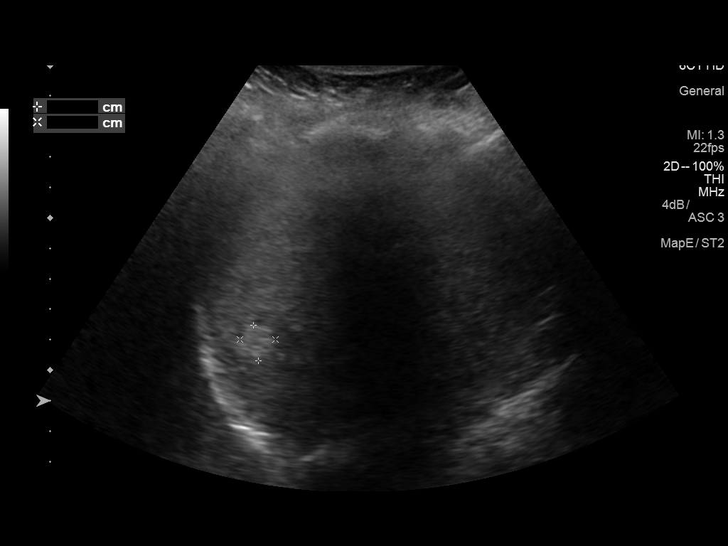
[im 25/73]
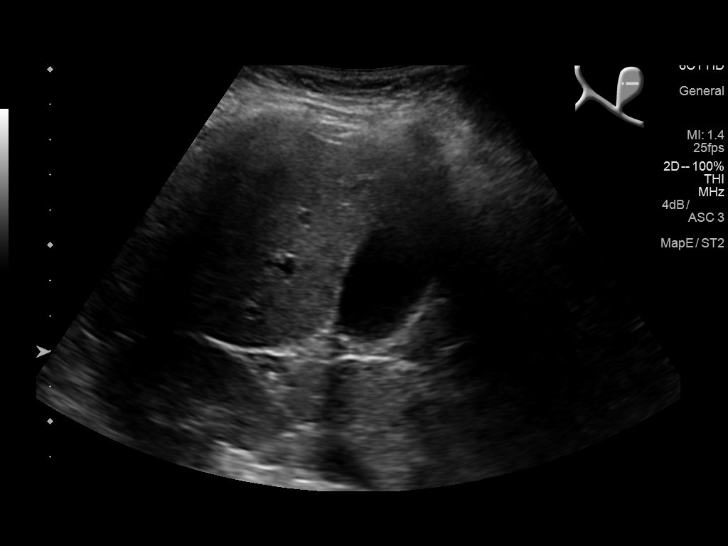
[im 28/73]
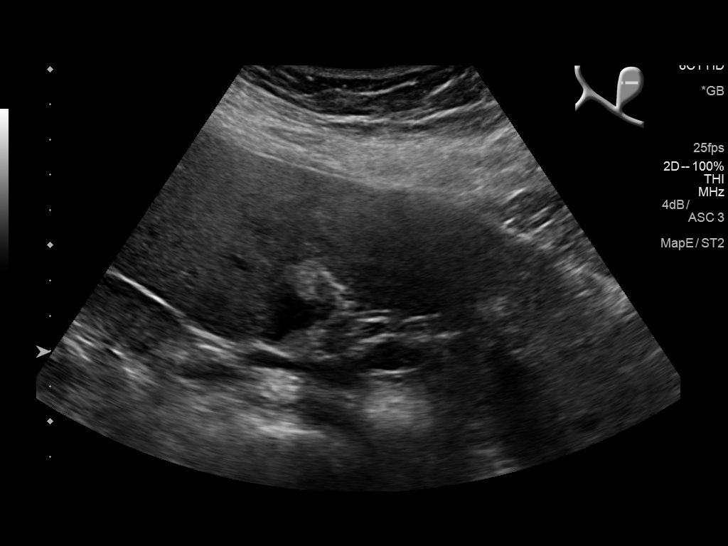
[im 34/73]
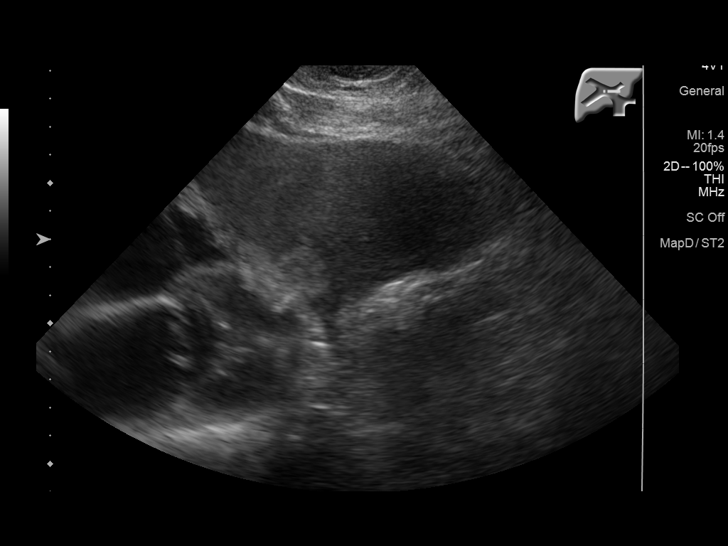
[im 40/73]
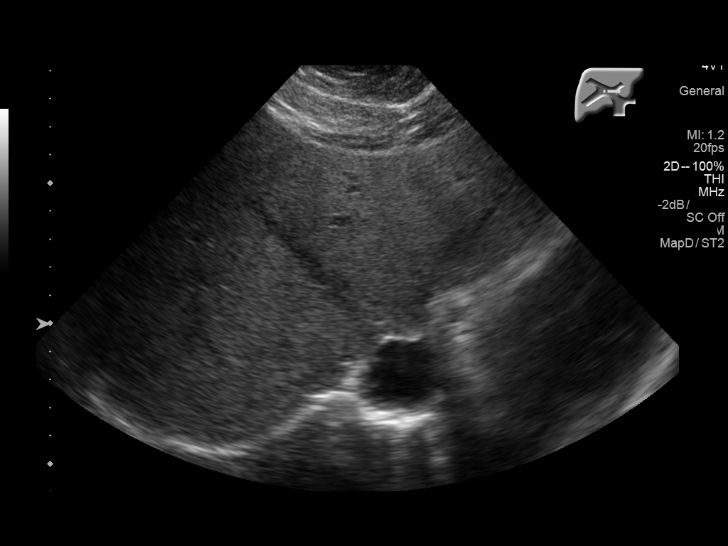
[im 46/73]
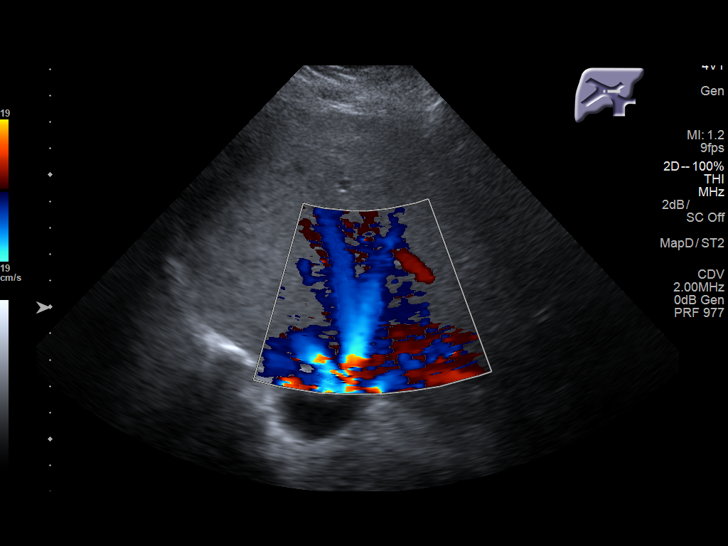
[im 49/73]
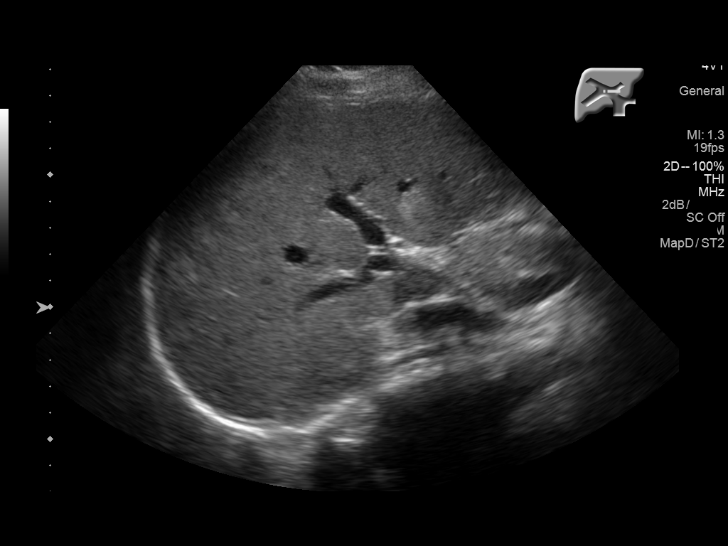
[im 55/73]
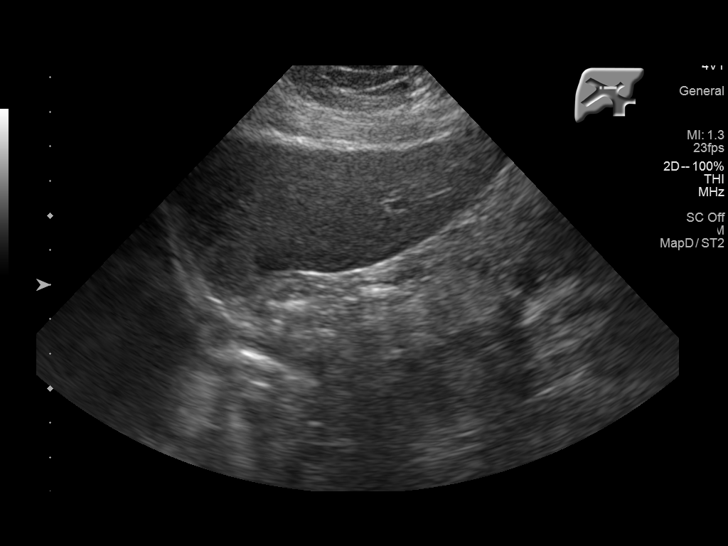
[im 61/73]
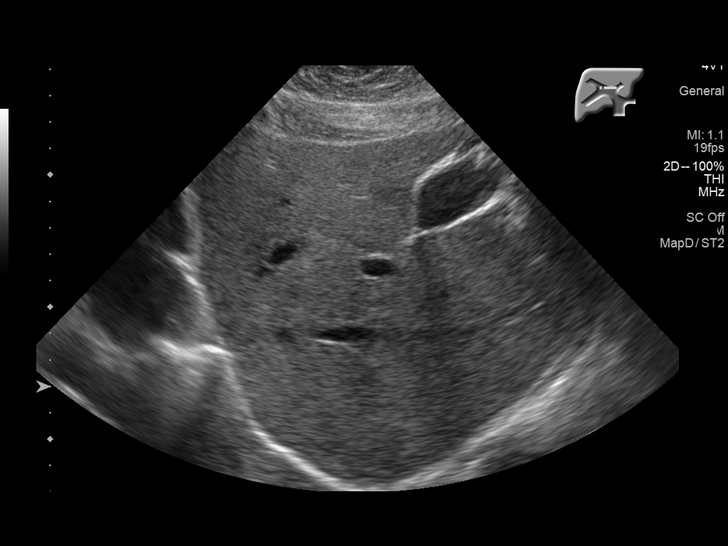
[im 67/73]
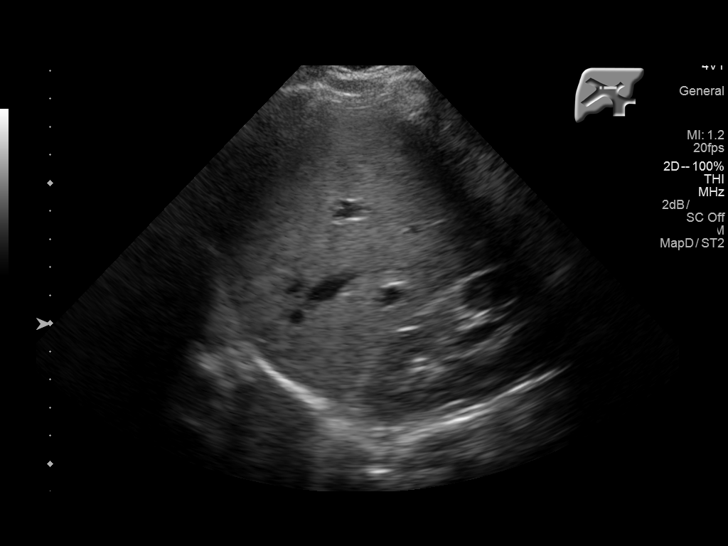
[im 73/73]
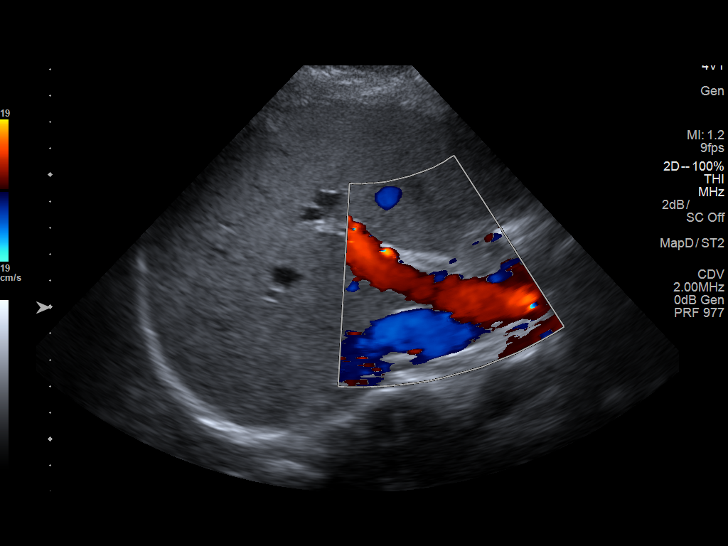

[14 of 25 positions shown; findings below may reference images not displayed]

FINDINGS: Gallbladder:

No gallstones or wall thickening visualized. No sonographic Murphy
sign noted by sonographer.

Common bile duct:

Diameter: Normal caliber, 3 mm.

Liver:

Multiple hyperechoic areas are noted within the liver. Left hepatic
lesion measures 1.9 x 1.9 x 1.5 cm. Right lobe hyperechoic lesion
adjacent to the gallbladder measures 2.1 x 1.8 x 1.5 cm. Additional
right lobe hyperechoic lesion measures 1.3 x 1.2 x 1.2 cm. These are
all stable, not significantly changed since prior study. No new
liver lesions. Normal echotexture otherwise.
IMPRESSION: Three stable hyperechoic lesions throughout the liver, most
compatible with hemangiomas. No change.

No acute findings.

## 2018-07-21 ENCOUNTER — Other Ambulatory Visit: Payer: Self-pay | Admitting: Family Medicine

## 2018-07-21 DIAGNOSIS — R7303 Prediabetes: Secondary | ICD-10-CM

## 2018-11-16 ENCOUNTER — Other Ambulatory Visit: Payer: Self-pay | Admitting: Family Medicine

## 2018-11-16 DIAGNOSIS — Z1231 Encounter for screening mammogram for malignant neoplasm of breast: Secondary | ICD-10-CM

## 2018-12-06 ENCOUNTER — Other Ambulatory Visit: Payer: Self-pay | Admitting: Family Medicine

## 2018-12-06 DIAGNOSIS — R7303 Prediabetes: Secondary | ICD-10-CM

## 2018-12-28 ENCOUNTER — Ambulatory Visit
Admission: RE | Admit: 2018-12-28 | Discharge: 2018-12-28 | Disposition: A | Discharge status: Home / Self Care | Payer: 59 | Source: Ambulatory Visit | Attending: Family Medicine | Admitting: Family Medicine

## 2018-12-28 ENCOUNTER — Other Ambulatory Visit: Payer: Self-pay

## 2018-12-28 DIAGNOSIS — Z1231 Encounter for screening mammogram for malignant neoplasm of breast: Secondary | ICD-10-CM

## 2018-12-31 ENCOUNTER — Encounter: Payer: Self-pay | Admitting: Family Medicine

## 2019-01-03 ENCOUNTER — Other Ambulatory Visit: Payer: Self-pay

## 2019-01-03 ENCOUNTER — Telehealth (INDEPENDENT_AMBULATORY_CARE_PROVIDER_SITE_OTHER): Payer: 59 | Admitting: Family Medicine

## 2019-01-03 DIAGNOSIS — F329 Major depressive disorder, single episode, unspecified: Secondary | ICD-10-CM

## 2019-01-03 DIAGNOSIS — E669 Obesity, unspecified: Secondary | ICD-10-CM

## 2019-01-03 DIAGNOSIS — F341 Dysthymic disorder: Secondary | ICD-10-CM | POA: Diagnosis not present

## 2019-01-03 DIAGNOSIS — Z9189 Other specified personal risk factors, not elsewhere classified: Secondary | ICD-10-CM

## 2019-01-03 DIAGNOSIS — R7303 Prediabetes: Secondary | ICD-10-CM

## 2019-01-03 MED ORDER — BUPROPION HCL ER (SMOKING DET) 150 MG PO TB12: 150.0000 mg | ORAL_TABLET | Freq: Two times a day (BID) | ORAL | 2 refills | Status: AC

## 2019-01-03 MED ORDER — BUPROPION HCL 75 MG PO TABS: ORAL_TABLET | ORAL | 0 refills | Status: DC

## 2019-01-03 NOTE — Progress Notes (Signed)
[place] / E- Visit   This visit type was conducted due to national recommendations for restrictions regarding the COVID-19 Pandemic (e.g. social distancing) in an effort to limit this patient's exposure and mitigate transmission in our community.   Due to their co-morbid illnesses, this patient is at least at moderate risk for complications without adequate follow up: yes   This format is felt to be most appropriate for this patient at this time.  All issues noted in this document were discussed and addressed.  A limited physical exam was performed with this format.   Patient consented to have visit conducted via telephone  Encounter participants: Patient: Melanie Cole  Patient Location: Home Provider: Sherren Mocha Davari Lopes at office  Others (if applicable): none  Chief Complaint: body weight  HPI:  Obesity Patient complains of obesity. Patient cites health, self-image as reasons for wanting to lose weight.  Obesity History Ms Roseman had been participating in [employer] [facility] [employer] [facility] [employer] until pandemic.  Since pandemic, she has not been involved with the program.  She had some success when participating though she has had a relapse in weight since VoViD.    Current Exercise Habits none  Current Eating Habits Number of regular meals per day: 2 Number of snacking episodes per day: 3 Who shops for food? patient Who prepares food? patient Who eats with patient? patient Binge behavior?: no Purge behavior? no Anorexic behavior? no Eating precipitated by stress? yes - patient self-identifies as a stress eater. Guilt feelings associated with eating? She does not feel good about her eating PMH: Prediabetes treated by [facility] with metformin with some weight loss  SADNESS Disease Monitoring Current symptoms include depressed mood, hypersomnia, weight gain and decreased sense of self-worth            Symptoms have been  gradually worsening     Is Exercising: no  Evidence of suicidal ideation: no.  No thoughts of harming others Stressors: Husband's infidelity Medication Monitoring Not on medications. Not involved with counseling.   ROS - See HPI  PMH Previous treatment includes: counseling by her pastor.  Last BMI 33% 06/11/2018 [place] ov No hx of seizures or bulemia/anorexa ROS:  No fever No cough  Pertinent PMHx: history of depressive symptoms without major mood disorder  Exam:  Respiratory: speaking in full sentence, no audible wheeze  Assessment/Plan:  No problem-specific Assessment & Plan notes found for this encounter.    COVID-19 Education: The signs and symptoms of COVID-19 were discussed with the patient and how to seek care for testing (follow up with PCP or arrange E-visit).  The importance of social distancing was discussed today.  Time spent on phone with patient: 18 minutes

## 2019-01-07 ENCOUNTER — Encounter: Payer: Self-pay | Admitting: Family Medicine

## 2019-01-07 NOTE — Assessment & Plan Note (Signed)
Low level sadness without ahedonia and less than 5 associated depression symptoms.

## 2019-01-07 NOTE — Assessment & Plan Note (Addendum)
New problem Recurrence Depressive symptoms without meeting criteria of MDD. Symptoms primarily driven by memories of husband's infidelity, isolation from pandemic, and decrease in self-esteem including her perceived weight gain.  Patient would like to try bupropion to help with her mood and with weight loss.   We discussed that bupropion is only FDA approved for weight loss when in combination pill of Naloxone-bupropion. She would like to try just the Bupropion for now, and would consider change to combo if fails to improve in weight loss.   Rx up-ramp titration over 4 weeks from bupropion 75 mg q am to 150 mg BID Discussed common adverse effects of anxiety, insomnia, increase blood pressure.

## 2019-01-07 NOTE — Assessment & Plan Note (Signed)
Established problem Uncontrolled  See reactive depression problem for plan.   Patient plans to change insurances soon in order to be able to re-attend the [facility].

## 2019-05-16 ENCOUNTER — Other Ambulatory Visit: Payer: Self-pay

## 2019-05-16 ENCOUNTER — Encounter: Payer: Self-pay | Admitting: Family Medicine

## 2019-05-16 ENCOUNTER — Ambulatory Visit (INDEPENDENT_AMBULATORY_CARE_PROVIDER_SITE_OTHER): Payer: 59 | Admitting: Family Medicine

## 2019-05-16 VITALS — BP 124/82 | HR 85 | Ht 64.0 in | Wt 196.0 lb

## 2019-05-16 DIAGNOSIS — E7800 Pure hypercholesterolemia, unspecified: Secondary | ICD-10-CM | POA: Diagnosis not present

## 2019-05-16 DIAGNOSIS — E559 Vitamin D deficiency, unspecified: Secondary | ICD-10-CM | POA: Diagnosis not present

## 2019-05-16 DIAGNOSIS — R1013 Epigastric pain: Secondary | ICD-10-CM

## 2019-05-16 DIAGNOSIS — R7303 Prediabetes: Secondary | ICD-10-CM | POA: Diagnosis not present

## 2019-05-16 DIAGNOSIS — G8929 Other chronic pain: Secondary | ICD-10-CM

## 2019-05-16 DIAGNOSIS — K279 Peptic ulcer, site unspecified, unspecified as acute or chronic, without hemorrhage or perforation: Secondary | ICD-10-CM

## 2019-05-16 MED ORDER — HYDROCODONE-ACETAMINOPHEN 5-325 MG PO TABS: 1.0000 | ORAL_TABLET | Freq: Four times a day (QID) | ORAL | 0 refills | Status: DC | PRN

## 2019-05-16 MED ORDER — OMEPRAZOLE 40 MG PO CPDR: 40.0000 mg | DELAYED_RELEASE_CAPSULE | Freq: Every day | ORAL | 1 refills | Status: DC

## 2019-05-16 NOTE — Patient Instructions (Signed)
We are checking CMET, CBC and lipase levels to look into the cause of your epigastric pain that radiates into your back.   You should be contacted by [facility] to schedule you for a Abdominal & Pelvic CT with contrast to look for anatomical causes of epigastric pain.   Dr Maleiah Dula would like to see you back in two weeks to go over the test results.   Please take the omeprazole 40 mg capsule daily before your largest meal of the day to see if this will help ease the abdominal pain.    If your pain is bad, then please take the hydrocodone-acetaminophen tablet.

## 2019-05-17 ENCOUNTER — Other Ambulatory Visit: Payer: Self-pay | Admitting: Family Medicine

## 2019-05-17 ENCOUNTER — Telehealth: Payer: Self-pay | Admitting: Family Medicine

## 2019-05-17 ENCOUNTER — Encounter: Payer: Self-pay | Admitting: Family Medicine

## 2019-05-17 DIAGNOSIS — R1013 Epigastric pain: Secondary | ICD-10-CM | POA: Insufficient documentation

## 2019-05-17 DIAGNOSIS — R7303 Prediabetes: Secondary | ICD-10-CM

## 2019-05-17 DIAGNOSIS — E7800 Pure hypercholesterolemia, unspecified: Secondary | ICD-10-CM

## 2019-05-17 DIAGNOSIS — Z9189 Other specified personal risk factors, not elsewhere classified: Secondary | ICD-10-CM

## 2019-05-17 DIAGNOSIS — I10 Essential (primary) hypertension: Secondary | ICD-10-CM

## 2019-05-17 DIAGNOSIS — E559 Vitamin D deficiency, unspecified: Secondary | ICD-10-CM

## 2019-05-17 DIAGNOSIS — G8929 Other chronic pain: Secondary | ICD-10-CM

## 2019-05-17 LAB — CBC WITH DIFFERENTIAL/PLATELET
Basophils Absolute: 0 10*3/uL (ref 0.0–0.2)
Basos: 1 %
EOS (ABSOLUTE): 0.2 10*3/uL (ref 0.0–0.4)
Eos: 2 %
Hematocrit: 43.8 % (ref 34.0–46.6)
Hemoglobin: 14.9 g/dL (ref 11.1–15.9)
Immature Grans (Abs): 0 10*3/uL (ref 0.0–0.1)
Immature Granulocytes: 0 %
Lymphocytes Absolute: 3.7 10*3/uL — ABNORMAL HIGH (ref 0.7–3.1)
Lymphs: 48 %
MCH: 30.3 pg (ref 26.6–33.0)
MCHC: 34 g/dL (ref 31.5–35.7)
MCV: 89 fL (ref 79–97)
Monocytes Absolute: 0.5 10*3/uL (ref 0.1–0.9)
Monocytes: 7 %
Neutrophils Absolute: 3.2 10*3/uL (ref 1.4–7.0)
Neutrophils: 42 %
Platelets: 384 10*3/uL (ref 150–450)
RBC: 4.92 x10E6/uL (ref 3.77–5.28)
RDW: 12.9 % (ref 11.7–15.4)
WBC: 7.6 10*3/uL (ref 3.4–10.8)

## 2019-05-17 LAB — CMP14+EGFR
ALT: 23 IU/L (ref 0–32)
AST: 21 IU/L (ref 0–40)
Albumin/Globulin Ratio: 1.4 (ref 1.2–2.2)
Albumin: 4.8 g/dL (ref 3.8–4.9)
Alkaline Phosphatase: 84 IU/L (ref 39–117)
BUN/Creatinine Ratio: 20 (ref 9–23)
BUN: 15 mg/dL (ref 6–24)
Bilirubin Total: 0.5 mg/dL (ref 0.0–1.2)
CO2: 26 mmol/L (ref 20–29)
Calcium: 10.6 mg/dL — ABNORMAL HIGH (ref 8.7–10.2)
Chloride: 97 mmol/L (ref 96–106)
Creatinine, Ser: 0.74 mg/dL (ref 0.57–1.00)
GFR calc Af Amer: 107 mL/min/{1.73_m2} (ref >59)
GFR calc non Af Amer: 93 mL/min/{1.73_m2} (ref >59)
Globulin, Total: 3.5 g/dL (ref 1.5–4.5)
Glucose: 89 mg/dL (ref 65–99)
Potassium: 4.5 mmol/L (ref 3.5–5.2)
Sodium: 141 mmol/L (ref 134–144)
Total Protein: 8.3 g/dL (ref 6.0–8.5)

## 2019-05-17 LAB — LIPID PANEL
Chol/HDL Ratio: 4.4 ratio (ref 0.0–4.4)
Cholesterol, Total: 257 mg/dL — ABNORMAL HIGH (ref 100–199)
HDL: 59 mg/dL (ref >39)
LDL Chol Calc (NIH): 176 mg/dL — ABNORMAL HIGH (ref 0–99)
Triglycerides: 124 mg/dL (ref 0–149)
VLDL Cholesterol Cal: 22 mg/dL (ref 5–40)

## 2019-05-17 LAB — POCT GLYCOSYLATED HEMOGLOBIN (HGB A1C): Hemoglobin A1C: 6.5 % — AB (ref 4.0–5.6)

## 2019-05-17 LAB — VITAMIN D 25 HYDROXY (VIT D DEFICIENCY, FRACTURES): Vit D, 25-Hydroxy: 30.3 ng/mL (ref 30.0–100.0)

## 2019-05-17 LAB — LIPASE: Lipase: 63 U/L (ref 14–72)

## 2019-05-17 MED ORDER — METFORMIN HCL ER 500 MG PO TB24: ORAL_TABLET | ORAL | 0 refills | Status: DC

## 2019-05-17 NOTE — Assessment & Plan Note (Signed)
Established problem ASCVD risk 4% No statin therapy inidcated per ACC GLs.

## 2019-05-17 NOTE — Progress Notes (Signed)
   CHIEF COMPLAINT / HPI:  ABDOMINAL PAIN  Location: epigastric  Onset: ~8months ago  Radiation: into lower thoracic/upper lumbar midline back  Severity: mild to severe Quality: ache with episodes of sharp Pattern: initially constant now intermittent Course: less intense and frequent pain in last 2 weeks  Better with: no change with food   Symptoms Awakens patient from sleep at night Nausea/Vomiting: no  Diarrhea: more frequent formed BM since starting [employer] diet, occasional explosive loose stool  Constipation: no  Melena/BRBPR: no  Hematemesis: no  Anorexia: no  Wt loss: no  EtOH use: no  NSAIDs/ASA: no  LMP: 2014  Past Surgeries: no abdominal surgeries; only pelvic surgery was BTL many years ago. "Bladder stretch" Past Abd/pelvic imaging: RUQ [place] 12/16/15 follow up hepatic hemangioma showed three stable hyperechoic lesions throughout liver c/w hemangiomas.  No change.  Colonoscopy (Dr Lyndel Safe, 2002) reported as unremarkable.    PERTINENT  PMH / PSH: hx adenomyosis, chronic bloating, GERD, Dx of IBS on problem list transferred from old EMR in 2008. IgA anti-glutaminase titer negative 2014 in w/u of paraumbilical pain and RLQ pain.  Father died with Pancreatic cancer  OBJECTIVE: LMP 11/16/2012   BMI 33% normal VS Abd: soft, paraumbilical striae, (+) BS, ND, TTP epigastric midline.  No palpable midline wall deficits, no palpable masses.  No HSM.  ASSESSMENT / PLAN:  No problem-specific Assessment & Plan notes found for this encounter.     Lissa Morales, MD [place]

## 2019-05-17 NOTE — Assessment & Plan Note (Signed)
Established problem. Adequate blood pressure control.  No evidence of new end organ damage.  Tolerating medication without significant adverse effects.  Plan to continue current blood pressure regiment.   Increased serum calcium 10.6 may be due to HCTZ therapy.  May need to cahnge therapy from thiazide to other Non-ACEI therapy or DHP CCB. (See "Adverse Effects under "Allergies")

## 2019-05-17 NOTE — Assessment & Plan Note (Signed)
Adequate serum Vit D25(OH) conc.  Pt taking 5000 units daily. Given rise in serum calcium, recommend reduction of Vit D3 to 2000 to 4000 units daily with recheck of the serum calcium in 4 to 6 weeks.

## 2019-05-17 NOTE — Assessment & Plan Note (Signed)
Rise in A1c. Ptient on [employer] diet with success over last month. Plan to assist weight loss and A1c control with Metformin XL 500 mg, 1 tab qam x 7d, then 2 qam x 7 days, then 3 tablets qam.   Recheck A1c and wt in 3 months.

## 2019-05-17 NOTE — Progress Notes (Addendum)
Visit Problem List with A/P  Chronic epigastric pain Abdominal and pelvic pain (Differential Diagnosis in order of frequency) with intermittent nausea  Dyspepsia (functional, ulcer, reflux)  Irritable bowel syndrome  Abdominal wall pain  Ventral hernia  Pancreatitic disorder  Hypercalcemia:   Rx omeprazole 40 mg daily for 8 weeks as empiric trial Rx Norco 5/325 #20 for severe abd pain  Await CT AP w/CM results   Hypercalcemia New problem  While hypercalcemia may be associated with abdominal discomfort from constipation of pancreatitis, I suspect this very mild elevation, possibly due to patient's HCTZ and Vitamin D therapies, would not provoke these gastrointestinal disorders.  Recommend repeat serum calcium in 4-6 weeks, perhaps at a reduced Vit D3 dose, and see trajectory of concentration.

## 2019-05-17 NOTE — Telephone Encounter (Signed)
Discussed lab results with Ms Baza.   Visit Problem List with A/P  Prediabetes Rise in A1c. Ptient on [employer] diet with success over last month. Plan to assist weight loss and A1c control with Metformin XL 500 mg, 1 tab qam x 7d, then 2 qam x 7 days, then 3 tablets qam.   Recheck A1c and wt in 3 months.   Vitamin D insufficiency Adequate serum Vit D25(OH) conc.  Pt taking 5000 units daily. Given rise in serum calcium, recommend reduction of Vit D3 to 2000 to 4000 units daily with recheck of the serum calcium in 4 to 6 weeks.   At risk for diabetes mellitus Rise in A1c. Ptient on [employer] diet with success over last month. Plan to assist weight loss and A1c control with Metformin XL 500 mg, 1 tab qam x 7d, then 2 qam x 7 days, then 3 tablets qam.   Recheck A1c and wt in 3 months.

## 2019-05-17 NOTE — Assessment & Plan Note (Signed)
New problem.  History of persistent paraumbilical and RLQ pain in 2014.  IBS on pateitn problem list from 2008 EMR.  Concerning finding of pain awakening patient from sleep at night and its radiation straight back into thoracolumbar region.   Only possibly pertinent lab finding is slightly elevated serum calcium 10.6.  No history of Incr Ca++.

## 2019-05-17 NOTE — Assessment & Plan Note (Signed)
Established problem Vit D25(OH) = 30 today Controlled Continue current therapy regiment. Could contribute to increased serum calcium.  Need to clarify dose daily Vit D3 patient is taking.

## 2019-05-17 NOTE — Assessment & Plan Note (Addendum)
Abdominal and pelvic pain (Differential Diagnosis in order of frequency)  Dyspepsia (functional, ulcer, reflux)  Irritable bowel syndrome  Abdominal wall pain  Ventral hernia  Pancreatitic disorder  Hypercalcemia:   Rx omeprazole 40 mg daily for 8 weeks as empiric trial Rx Norco 5/325 #20 for severe abd pain  Await CT AP w/CM results

## 2019-05-17 NOTE — Assessment & Plan Note (Signed)
New problem  While hypercalcemia may be associated with abdominal discomfort from constipation of pancreatitis, I suspect this very mild elevation, possibly due to patient's HCTZ and Vitamin D therapies, would not provoke these gastrointestinal disorders.  Recommend repeat serum calcium in 4-6 weeks, perhaps at a reduced Vit D3 dose, and see trajectory of concentration.

## 2019-05-20 ENCOUNTER — Other Ambulatory Visit: Payer: Self-pay | Admitting: Family Medicine

## 2019-05-27 ENCOUNTER — Inpatient Hospital Stay: Admission: RE | Admit: 2019-05-27 | Payer: 59 | Source: Ambulatory Visit

## 2019-06-03 ENCOUNTER — Encounter: Payer: Self-pay | Admitting: Family Medicine

## 2019-06-03 NOTE — Progress Notes (Signed)
Per [facility] PA request for supporting records for CT AP order, ov 05/17/19 fax'd to [phone].

## 2019-06-04 ENCOUNTER — Telehealth: Payer: Self-pay

## 2019-06-04 DIAGNOSIS — G8929 Other chronic pain: Secondary | ICD-10-CM

## 2019-06-04 DIAGNOSIS — R102 Pelvic and perineal pain: Secondary | ICD-10-CM

## 2019-06-04 NOTE — Telephone Encounter (Signed)
Application for PA request received 05/31/19. PA completed request fax'd to [facility] 06/03/19  Will reorder the CT AP with CM  Please let Ms Underhill know that CT reordered.

## 2019-06-04 NOTE — Telephone Encounter (Signed)
Patient calls nurse line stating she has a CT scheduled for 2/22, however it was cancelled due to not being authorized in time. Will forward to Kennyth Lose to authorize and MD for FYI.

## 2019-06-05 DIAGNOSIS — R102 Pelvic and perineal pain: Secondary | ICD-10-CM | POA: Insufficient documentation

## 2019-06-05 HISTORY — DX: Pelvic and perineal pain: R10.2

## 2019-06-05 NOTE — Telephone Encounter (Signed)
Appt made and patient informed.  She states that she is not able to do mornings but will call [place] [facility] herself to reschedule to a time that works for her.  Antion Andres,[name]

## 2019-06-07 ENCOUNTER — Other Ambulatory Visit: Payer: Self-pay

## 2019-06-07 ENCOUNTER — Other Ambulatory Visit: Payer: 59

## 2019-06-08 LAB — BASIC METABOLIC PANEL
BUN/Creatinine Ratio: 16 (ref 9–23)
BUN: 12 mg/dL (ref 6–24)
CO2: 21 mmol/L (ref 20–29)
Calcium: 10.4 mg/dL — ABNORMAL HIGH (ref 8.7–10.2)
Chloride: 100 mmol/L (ref 96–106)
Creatinine, Ser: 0.73 mg/dL (ref 0.57–1.00)
GFR calc Af Amer: 109 mL/min/{1.73_m2} (ref >59)
GFR calc non Af Amer: 94 mL/min/{1.73_m2} (ref >59)
Glucose: 76 mg/dL (ref 65–99)
Sodium: 141 mmol/L (ref 134–144)

## 2019-06-11 ENCOUNTER — Telehealth: Payer: Self-pay | Admitting: Family Medicine

## 2019-06-11 NOTE — Telephone Encounter (Signed)
[place] / E- Visit   Patient's identity was confirmed using her Full Name and DOB.  Encounter participants: Patient: Melanie Cole  Patient Location: Other:  Home Provider: Sherren Mocha Gentri Guardado at office  Others (if applicable): no   Chief Complaint: abdominal pain  HPI:  ABDOMINAL PAIN  Epigastric pain, first reported to me at 05/16/19 office visit, is still present.  Occurring multiple times a day with radiation into thoracolumbar midline of back.  Episodes last about 30 minutes.  Episodes are severe enough to interfere with activity.    Burning quality that intensifies. Associated with nausea. Course is unchanged since 05/16/19 CT AP has yet to be approved by [facility] payor [name] did not take the prescribed omeprazole (05/16/19)  Associated Symptoms Nausea/Vomiting: nauseous with episodes of pain exacerbation Diarrhea: episodic diarrhea and constipation unrelated to epigastric/back pain Constipation: yes  Melena/BRBPR: no  Fever/Chills: no Weight loss: no  Past Surgeries: "Bladder stretch" many years ago, BTL, Colonoscopy 2002 (Dr Lyndel Safe in [place]), only finding of internal hemorrhoids  Past Abd/pelvic imaging: RUQ [place] 12/2015 showed 3 stable liver hemangiomas.   A/ Persistent epigastric abdominal pain Hypercalcemia  P/ Recommend trial of omeprazole Referral to Gastroenterology (Dr Havery Moros) for evaluation of pain Continue attempting to get CT AP apprived by [facility]. Will send supporting documentation request by [facility]

## 2019-06-13 ENCOUNTER — Encounter: Payer: Self-pay | Admitting: Gastroenterology

## 2019-06-13 ENCOUNTER — Other Ambulatory Visit: Payer: Self-pay | Admitting: Family Medicine

## 2019-06-13 ENCOUNTER — Other Ambulatory Visit: Payer: 59

## 2019-06-13 DIAGNOSIS — G8929 Other chronic pain: Secondary | ICD-10-CM

## 2019-06-13 DIAGNOSIS — R1013 Epigastric pain: Secondary | ICD-10-CM

## 2019-06-17 ENCOUNTER — Other Ambulatory Visit: Payer: 59

## 2019-07-04 ENCOUNTER — Ambulatory Visit
Admission: RE | Admit: 2019-07-04 | Discharge: 2019-07-04 | Disposition: A | Discharge status: Home / Self Care | Payer: 59 | Source: Ambulatory Visit | Attending: Family Medicine | Admitting: Family Medicine

## 2019-07-04 DIAGNOSIS — R1013 Epigastric pain: Secondary | ICD-10-CM

## 2019-07-04 MED ORDER — IOPAMIDOL (ISOVUE-300) INJECTION 61%
100.0000 mL | Freq: Once | INTRAVENOUS | Status: AC | PRN
  Administered 2019-07-04: 100 mL via INTRAVENOUS

## 2019-07-08 ENCOUNTER — Encounter: Payer: Self-pay | Admitting: Family Medicine

## 2019-07-23 ENCOUNTER — Ambulatory Visit (INDEPENDENT_AMBULATORY_CARE_PROVIDER_SITE_OTHER): Payer: 59 | Admitting: Gastroenterology

## 2019-07-23 ENCOUNTER — Encounter: Payer: Self-pay | Admitting: Gastroenterology

## 2019-07-23 VITALS — BP 150/100 | HR 80 | Temp 97.9°F | Ht 64.0 in | Wt 198.6 lb

## 2019-07-23 DIAGNOSIS — R1013 Epigastric pain: Secondary | ICD-10-CM | POA: Diagnosis not present

## 2019-07-23 DIAGNOSIS — Z01818 Encounter for other preprocedural examination: Secondary | ICD-10-CM | POA: Diagnosis not present

## 2019-07-23 DIAGNOSIS — R194 Change in bowel habit: Secondary | ICD-10-CM | POA: Diagnosis not present

## 2019-07-23 DIAGNOSIS — R140 Abdominal distension (gaseous): Secondary | ICD-10-CM | POA: Diagnosis not present

## 2019-07-23 NOTE — Progress Notes (Signed)
HPI :  54 year old female with a history of hypertension, GERD,, family history of pancreatic cancer, referred by Sherren Mocha McDiarmid MD for abdominal pain.  The patient states she has been having epigastric pain since December.  She feels pain there pretty much every day although tends to be intermittent.  Previously she was feeling it 4-5 times a day, more recently it has been perhaps 2 times a day.  Symptoms seem to last for about 15 minutes and then tend to resolve on their own.  She feels it boring through the middle of her back.  This pain has woken her out of a sleep on occasion.  She has a hard time finding any triggers to her pain.  She does not feel that eating will make her pain any better or any worse, however she does find some nausea with this but does not vomit.  She has a lot of belching with this but has not had much heartburn or regurgitation.  No dysphagia.  She has lost a few pounds lately but she is been trying to do so through dieting.  She denies any NSAID use.  She was given a trial of omeprazole for 2 weeks which did not provide any benefit.    She has some occasional bloating and distention of her abdomen that comes and goes.  She has had some loose stools occasionally as well as some constipated stools.  No blood in her stool at all.  She had a colonoscopy with Dr. Collene Mares in 2019 that reportedly did not show any problems.  Her father had pancreatic cancer at age 65, and her aunt and uncle, not on the same side of the family, had colon cancer.  She has never had an upper endoscopy.  She had a CT scan of her abdomen on April 1 which was normal.  CT 07/04/19: IMPRESSION: Negative CT abdomen.  Please note that the pelvis was not imaged.  RUQ [place] 12/16/15: IMPRESSION: Three stable hyperechoic lesions throughout the liver, most compatible with hemangiomas. No change.  No acute findings.  Past Medical History:  Diagnosis Date  . Adenomyosis   . Atopic dermatitis 12/18/2012  .  Bloating 12/10/2015  . Chest pain, unspecified 12/16/2013  . Constipation   . Essential hypertension, benign   . GERD (gastroesophageal reflux disease)   . Hemangioma of liver 09/10/2010   09/09/10 Abdominopelvic CT found incidental 1.8 cm round mass in the left lobe of the liver which is likely a solitary cavernous hemangioma    . High blood pressure   . High cholesterol   . History of gestational diabetes   . History of postmenopausal bleeding 12/16/2013  . History of pulmonary function tests 2002   Normal values.  . Hypertension   . IBS (irritable bowel syndrome)   . Kidney cyst, acquired    left  . Lesion of liver 02/13/2015  . MIGRAINE, UNSPEC., W/O INTRACTABLE MIGRAINE 06/01/2006       . Mondor's disease   . Paresthesias 12/10/2015  . Patellar tendinitis/bursitis 12/18/2012  . Postmenopause   . Pre-diabetes   . Urinary, incontinence, stress female 04/08/2014  . Uterus, adenomyosis    Per patient's recall  . Vasomotor flushing 12/16/2013  . Vitamin D deficiency      Past Surgical History:  Procedure Laterality Date  . Bladder "Stretched"    . CARDIOVASCULAR STRESS TEST  12/03/2000   Stress Echocargiogram - 12/03/2000, NORMAL  . COLONOSCOPY  05/05/2000    Dr Lyndel Safe [facility])- Internal  Hemorrhoids  . SPIROMETRY  12/03/2000   Normal Pulmonary Function Testing   . TUBAL LIGATION     Family History  Problem Relation Age of Onset  . Pancreatic cancer Father        Died age 84  . Arrhythmia Father        Required a permanent pacemaker  . Diabetes Father   . Colon cancer Cousin        Colorectal Cancer  . Diabetes Mother   . Thyroid disease Mother   . High blood pressure Mother   . High Cholesterol Mother   . Colon cancer Paternal Aunt   . Breast cancer Cousin   . Lung cancer Maternal Uncle   . Lung cancer Paternal Uncle    Social History   Tobacco Use  . Smoking status: Never Smoker  . Smokeless tobacco: Never Used  Substance Use Topics  . Alcohol use: No  . Drug use: No    Current Outpatient Medications  Medication Sig Dispense Refill  . cholecalciferol (VITAMIN D3) 25 MCG (1000 UNIT) tablet Take 1,000 Units by mouth daily.    . hydrochlorothiazide (HYDRODIURIL) 25 MG tablet Take 1 tablet (25 mg total) by mouth daily. 90 tablet 3  . MELATONIN ER PO Take 1 capsule by mouth daily.    . metFORMIN (GLUCOPHAGE-XR) 500 MG 24 hr tablet Take 1 tablet (500 mg total) by mouth daily with breakfast for 7 days, THEN 2 tablets (1,000 mg total) daily with breakfast for 7 days, THEN 3 tablets (1,500 mg total) daily with breakfast. (Patient taking differently: Take 3 tablets by mouth once daily) 249 tablet 0  . Zinc Sulfate (ZINC 15 PO) Take by mouth.     No current facility-administered medications for this visit.   Allergies  Allergen Reactions  . Ace Inhibitors     REACTION: Atopic rash  . Amlodipine Besylate Other (See Comments)    Peripheral edema and dry mouth.      Review of Systems: All systems reviewed and negative except where noted in HPI.    CT ABDOMEN W CONTRAST  Result Date: 07/04/2019 CLINICAL DATA:  Epigastric abdominal pain EXAM: CT ABDOMEN WITH CONTRAST TECHNIQUE: Multidetector CT imaging of the abdomen was performed using the standard protocol following bolus administration of intravenous contrast. CONTRAST:  180mL ISOVUE-300 IOPAMIDOL (ISOVUE-300) INJECTION 61% COMPARISON:  CT abdomen/pelvis dated 09/09/2010 FINDINGS: Lower chest: Lung bases are clear. Hepatobiliary: Liver is within normal limits. Gallbladder is unremarkable. No intrahepatic or extrahepatic ductal dilatation. Pancreas: Within normal limits. Spleen: Within normal limits. Adrenals/Urinary Tract: Adrenal glands are within normal limits. 8 mm cyst in the medial left lower kidney (series 11/image 18). Right kidney is within normal limits. No hydronephrosis. Stomach/Bowel: Stomach is within normal limits. Visualized bowel is grossly unremarkable. Vascular/Lymphatic: No evidence of abdominal  aortic aneurysm. No suspicious abdominal lymphadenopathy. Other: No abdominal ascites. Musculoskeletal: Visualized osseous structures are within normal limits. IMPRESSION: Negative CT abdomen. Please note that the pelvis was not imaged. Electronically Signed   By: Julian Hy M.D.   On: 07/04/2019 19:37   Lab Results  Component Value Date   WBC 7.6 05/16/2019   HGB 14.9 05/16/2019   HCT 43.8 05/16/2019   MCV 89 05/16/2019   PLT 384 05/16/2019    Lab Results  Component Value Date   CREATININE 0.73 06/07/2019   BUN 12 06/07/2019   NA 141 06/07/2019   K CANCELED 06/07/2019   CL 100 06/07/2019   CO2 21 06/07/2019  Lab Results  Component Value Date   ALT 23 05/16/2019   AST 21 05/16/2019   ALKPHOS 84 05/16/2019   BILITOT 0.5 05/16/2019   Lab Results  Component Value Date   LIPASE 63 05/16/2019     Physical Exam: BP (!) 150/100   Pulse 80   Temp 97.9 F (36.6 C)   Ht 5\' 4"  (1.626 m)   Wt 198 lb 9.6 oz (90.1 kg)   LMP 11/16/2012   BMI 34.09 kg/m  Constitutional: Pleasant,well-developed, female in no acute distress. HEENT: Normocephalic and atraumatic. Conjunctivae are normal. No scleral icterus. Neck supple.  Cardiovascular: Normal rate, regular rhythm.  Pulmonary/chest: Effort normal and breath sounds normal. No wheezing, rales or rhonchi. Abdominal: Soft, nondistended, nontender. . There are no masses palpable. No hepatomegaly. Extremities: no edema Lymphadenopathy: No cervical adenopathy noted. Neurological: Alert and oriented to person place and time. Skin: Skin is warm and dry. No rashes noted. Psychiatric: Normal mood and affect. Behavior is normal.   ASSESSMENT AND PLAN: 54 year old female here for new patient assessment of the following:  Epigastric pain - this is her main complaint as described above, now ongoing for a few months and persistent.  She has had some nocturnal symptoms bothering her in sleep.  Her labs have been normal, CT scan  negative, she is not had any improvement with a trial of PPI.  I discussed differential diagnosis with her.  Esophagitis seems less likely.  PUD is on the differential although again no improvement with PPI or abnormalities on imaging.  H. pylori gastritis possible but symptoms a bit atypical for that.  Her pancreas looks normal fortunately especially given her father's history of pancreatic cancer.  Discussed options moving forward with her.  I offered her an EGD to further evaluate and rule out upper GI tract pathology to cause this.  I discussed risk and benefits and she wanted to proceed.  Further recommendations pending results.  Altered bowel habits - colonoscopy 2019 without any concerning pathology.  She is alternating between loose stools and constipation.  Recommend trial of Citrucel once daily to see if that can provide some regularity.  She agreed  Harvey Cellar, MD [facility]  CC: McDiarmid, Blane Ohara, MD

## 2019-07-23 NOTE — Patient Instructions (Addendum)
PLEASE HOLD METFORMIN MORNING OF YOUR ENDOSCOPY PROCEDURE! __________________________________________________________________________________ You have been scheduled for an endoscopy. Please follow written instructions given to you at your visit today. If you use inhalers (even only as needed), please bring them with you on the day of your procedure. Your physician has requested that you go to www.startemmi.com and enter the access code given to you at your visit today. This web site gives a general overview about your procedure. However, you should still follow specific instructions given to you by our office regarding your preparation for the procedure. ______________________________________________________________________  We will request your colonoscopy report from Dr [facility] office. _____________________________________________________________________   Please purchase the following medications over the counter and take as directed: Citrucel -Take as directed ______________________________________________________________________   If you are age 79 or older, your body mass index should be between 23-30. Your Body mass index is 34.09 kg/m. If this is out of the aforementioned range listed, please consider follow up with your Primary Care Provider.  If you are age 48 or younger, your body mass index should be between 19-25. Your Body mass index is 34.09 kg/m. If this is out of the aformentioned range listed, please consider follow up with your Primary Care Provider.  ______________________________________________________________________   Due to recent changes in healthcare laws, you may see the results of your imaging and laboratory studies on MyChart before your provider has had a chance to review them.  We understand that in some cases there may be results that are confusing or concerning to you. Not all laboratory results come back in the same time frame and the provider may be waiting  for multiple results in order to interpret others.  Please give [place] 48 hours in order for your provider to thoroughly review all the results before contacting the office for clarification of your results.    Thank you for entrusting me with your care and for choosing [facility], Dr. Inyokern Cellar

## 2019-07-31 ENCOUNTER — Encounter: Payer: Self-pay | Admitting: Gastroenterology

## 2019-08-01 ENCOUNTER — Other Ambulatory Visit: Payer: Self-pay | Admitting: Gastroenterology

## 2019-08-01 ENCOUNTER — Ambulatory Visit (INDEPENDENT_AMBULATORY_CARE_PROVIDER_SITE_OTHER): Payer: 59

## 2019-08-01 DIAGNOSIS — Z1159 Encounter for screening for other viral diseases: Secondary | ICD-10-CM

## 2019-08-02 LAB — SARS CORONAVIRUS 2 (TAT 6-24 HRS): SARS Coronavirus 2: NEGATIVE

## 2019-08-05 ENCOUNTER — Other Ambulatory Visit: Payer: Self-pay

## 2019-08-05 ENCOUNTER — Ambulatory Visit (AMBULATORY_SURGERY_CENTER): Payer: 59 | Admitting: Gastroenterology

## 2019-08-05 ENCOUNTER — Encounter: Payer: Self-pay | Admitting: Gastroenterology

## 2019-08-05 VITALS — BP 141/88 | HR 66 | Temp 96.2°F | Resp 19 | Ht 64.0 in | Wt 198.0 lb

## 2019-08-05 DIAGNOSIS — R1013 Epigastric pain: Secondary | ICD-10-CM

## 2019-08-05 DIAGNOSIS — K317 Polyp of stomach and duodenum: Secondary | ICD-10-CM

## 2019-08-05 DIAGNOSIS — K2950 Unspecified chronic gastritis without bleeding: Secondary | ICD-10-CM

## 2019-08-05 DIAGNOSIS — K449 Diaphragmatic hernia without obstruction or gangrene: Secondary | ICD-10-CM | POA: Diagnosis not present

## 2019-08-05 DIAGNOSIS — K3189 Other diseases of stomach and duodenum: Secondary | ICD-10-CM

## 2019-08-05 MED ORDER — SODIUM CHLORIDE 0.9 % IV SOLN: 500.0000 mL | Freq: Once | INTRAVENOUS | Status: DC

## 2019-08-05 NOTE — Progress Notes (Signed)
[place]

## 2019-08-05 NOTE — Progress Notes (Signed)
Called to room to assist during endoscopic procedure.  Patient ID and intended procedure confirmed with present staff. Received instructions for my participation in the procedure from the performing physician.  

## 2019-08-05 NOTE — Op Note (Signed)
[place] Patient Name: Rosalynn Molyneaux Procedure Date: 08/05/2019 3:40 PM MRN: [id number] Endoscopist: Remo Lipps P. Havery Moros , MD Age: 54 Referring MD:  Date of Birth: 20-Feb-1966 Gender: Female Account #: [id number] Procedure:                Upper GI endoscopy Indications:              Epigastric abdominal pain Medicines:                Monitored Anesthesia Care Procedure:                Pre-Anesthesia Assessment:                           - Prior to the procedure, a History and Physical                            was performed, and patient medications and                            allergies were reviewed. The patient's tolerance of                            previous anesthesia was also reviewed. The risks                            and benefits of the procedure and the sedation                            options and risks were discussed with the patient.                            All questions were answered, and informed consent                            was obtained. Prior Anticoagulants: The patient has                            taken no previous anticoagulant or antiplatelet                            agents. ASA Grade Assessment: II - A patient with                            mild systemic disease. After reviewing the risks                            and benefits, the patient was deemed in                            satisfactory condition to undergo the procedure.                           After obtaining informed consent, the endoscope was  passed under direct vision. Throughout the                            procedure, the patient's blood pressure, pulse, and                            oxygen saturations were monitored continuously. The                            Endoscope was introduced through the mouth, and                            advanced to the second part of duodenum. The upper                            GI endoscopy was  accomplished without difficulty.                            The patient tolerated the procedure well. Scope In: Scope Out: Findings:                 Esophagogastric landmarks were identified: the                            Z-line was found at 39 cm, the gastroesophageal                            junction was found at 39 cm and the upper extent of                            the gastric folds was found at 40 cm from the                            incisors.                           A 1 cm hiatal hernia was present.                           The exam of the esophagus was otherwise normal.                           A single 3 mm sessile polyp was found in the                            gastric body. The polyp was removed with a cold                            biopsy forceps. Resection and retrieval were                            complete.  The entire examined stomach was otherwise normal -                            no ulcerations. Biopsies were taken with a cold                            forceps for Helicobacter pylori testing.                           The duodenal bulb and second portion of the                            duodenum were normal. Complications:            No immediate complications. Estimated blood loss:                            Minimal. Estimated Blood Loss:     Estimated blood loss was minimal. Impression:               - Esophagogastric landmarks identified.                           - 1 cm hiatal hernia.                           - Normal esophagus otherwise                           - A single gastric polyp. Resected and retrieved.                           - Normal stomach otherwise. Biopsied to rule out H                            pylori.                           - Normal duodenal bulb and second portion of the                            duodenum. Recommendation:           - Patient has a contact number available for                             emergencies. The signs and symptoms of potential                            delayed complications were discussed with the                            patient. Return to normal activities tomorrow.                            Written discharge instructions were provided to the  patient.                           - Resume previous diet.                           - Continue present medications.                           - Await pathology results with further                            recommendations Remo Lipps P. Dohn Stclair, MD 08/05/2019 4:03:41 PM This report has been signed electronically.

## 2019-08-05 NOTE — Patient Instructions (Signed)
YOU HAD AN ENDOSCOPIC PROCEDURE TODAY AT THE [facility]:   Refer to the procedure report that was given to you for any specific questions about what was found during the examination.  If the procedure report does not answer your questions, please call your gastroenterologist to clarify.  If you requested that your care partner not be given the details of your procedure findings, then the procedure report has been included in a sealed envelope for you to review at your convenience later.  YOU SHOULD EXPECT: Some feelings of bloating in the abdomen. Passage of more gas than usual.  Walking can help get rid of the air that was put into your GI tract during the procedure and reduce the bloating. If you had a lower endoscopy (such as a colonoscopy or flexible sigmoidoscopy) you may notice spotting of blood in your stool or on the toilet paper. If you underwent a bowel prep for your procedure, you may not have a normal bowel movement for a few days.  Please Note:  You might notice some irritation and congestion in your nose or some drainage.  This is from the oxygen used during your procedure.  There is no need for concern and it should clear up in a day or so.  SYMPTOMS TO REPORT IMMEDIATELY:     Following upper endoscopy (EGD)  Vomiting of blood or coffee ground material  New chest pain or pain under the shoulder blades  Painful or persistently difficult swallowing  New shortness of breath  Fever of 100F or higher  Black, tarry-looking stools  For urgent or emergent issues, a gastroenterologist can be reached at any hour by calling [phone]. Do not use MyChart messaging for urgent concerns.    DIET:  We do recommend a small meal at first, but then you may proceed to your regular diet.  Drink plenty of fluids but you should avoid alcoholic beverages for 24 hours.  MEDICATIONS: Continue present medications.  Please see handouts given to you by your recovery nurse. ACTIVITY:   You should plan to take it easy for the rest of today and you should NOT DRIVE or use heavy machinery until tomorrow (because of the sedation medicines used during the test).    FOLLOW UP: Our staff will call the number listed on your records 48-72 hours following your procedure to check on you and address any questions or concerns that you may have regarding the information given to you following your procedure. If we do not reach you, we will leave a message.  We will attempt to reach you two times.  During this call, we will ask if you have developed any symptoms of COVID 19. If you develop any symptoms (ie: fever, flu-like symptoms, shortness of breath, cough etc.) before then, please call [phone].  If you test positive for Covid 19 in the 2 weeks post procedure, please call and report this information to us.    If any biopsies were taken you will be contacted by phone or by letter within the next 1-3 weeks.  Please call us at [phone] if you have not heard about the biopsies in 3 weeks.   Thank you for allowing us to provide for your healthcare needs today.   SIGNATURES/CONFIDENTIALITY: You and/or your care partner have signed paperwork which will be entered into your electronic medical record.  These signatures attest to the fact that that the information above on your After Visit Summary has been reviewed and is understood.    responsibility of the confidentiality of this discharge information lies with you and/or your care-partner.

## 2019-08-07 ENCOUNTER — Telehealth: Payer: Self-pay | Admitting: *Deleted

## 2019-08-07 ENCOUNTER — Telehealth: Payer: Self-pay

## 2019-08-07 NOTE — Telephone Encounter (Signed)
  Follow up Call-  Call back number 08/05/2019  Post procedure Call Back phone  # [phone]  Permission to leave phone message Yes  Some recent data might be hidden     Patient questions:  Do you have a fever, pain , or abdominal swelling? No. Pain Score  0 *  Have you tolerated food without any problems? Yes.    Have you been able to return to your normal activities? Yes.    Do you have any questions about your discharge instructions: Diet   No. Medications  No. Follow up visit  No.  Do you have questions or concerns about your Care? No.  Actions: * If pain score is 4 or above: No action needed, pain <4.  1. Have you developed a fever since your procedure? no  2.   Have you had an respiratory symptoms (SOB or cough) since your procedure? no  3.   Have you tested positive for COVID 19 since your procedure no  4.   Have you had any family members/close contacts diagnosed with the COVID 19 since your procedure?  no   If yes to any of these questions please route to Joylene John, RN and Erenest Rasher, RN

## 2019-08-07 NOTE — Telephone Encounter (Signed)
  Follow up Call-  Call back number 08/05/2019  Post procedure Call Back phone  # [phone]  Permission to leave phone message Yes  Some recent data might be hidden     Left message

## 2019-08-09 ENCOUNTER — Other Ambulatory Visit: Payer: Self-pay

## 2019-08-09 DIAGNOSIS — R1013 Epigastric pain: Secondary | ICD-10-CM

## 2019-08-09 DIAGNOSIS — R109 Unspecified abdominal pain: Secondary | ICD-10-CM

## 2019-08-09 NOTE — Progress Notes (Signed)
us

## 2019-08-16 ENCOUNTER — Ambulatory Visit (HOSPITAL_COMMUNITY)
Admission: RE | Admit: 2019-08-16 | Discharge: 2019-08-16 | Disposition: A | Discharge status: Home / Self Care | Payer: 59 | Source: Ambulatory Visit | Attending: Gastroenterology | Admitting: Gastroenterology

## 2019-08-16 ENCOUNTER — Other Ambulatory Visit: Payer: Self-pay

## 2019-08-16 DIAGNOSIS — R109 Unspecified abdominal pain: Secondary | ICD-10-CM

## 2019-08-16 DIAGNOSIS — R1013 Epigastric pain: Secondary | ICD-10-CM | POA: Diagnosis not present

## 2019-09-20 ENCOUNTER — Encounter: Payer: Self-pay | Admitting: Family Medicine

## 2019-09-20 NOTE — Progress Notes (Signed)
Initial Consultation [facility] 09/18/19 Nadeen Landau, MD  A/ Symptomatic Cholelithiasis P/ Laparoscopic Cholecystectomy planned.

## 2019-10-08 ENCOUNTER — Ambulatory Visit: Payer: Self-pay | Admitting: Surgery

## 2019-10-08 NOTE — H&P (Signed)
CC: Referred by Dr. Havery Moros for symptomatic cholelithiasis  HPI: [name] is a very pleasant 33yoF with hx of HTN, DM, GERD and presents to our office for evaluation of possible symptomatic cholelithiasis. She reports a history of proximal in 6 months of duration of intermittent midepigastric abdominal discomforts and pains that radiate to her right back. There is no clear association with food intake with these. They had initially occurred up to 6 times a day lasting anywhere from minutes to hours but over the last few weeks have been occurring on average of 2 times per day. The pain is described as sharp. She denies any nausea or vomiting. She denies any fever or chills. She has been seen by gastroenterology, Dr. Havery Moros admits she is also trialed omeprazole with no help with regards to her symptoms. She will have occasional issues with some bloating as well as mild constipation but hasn't been suffering from any of this lately.  She underwent EGD 08/05/19 that showed a very small 1 cm hiatal hernia. 3 mm gastric polyp. Biopsies did not reveal Helicobacter pylori.  She underwent right upper quadrant ultrasound 08/16/18 on admission demonstrates multiple gallstones up to 1.4 cm there are mobile. All bile duct measured 3 mm. No gallbladder wall thickening or pericholecystic fluid.  Last blood work 05/2019 - normal LFTs and lipase; wbc 7.6, plt 384  PMH: HTN, DM, GERD  PSH: BTL; denies any other abdominal or pelvic surgical history  FHx: Multiple remote relatives with cancer history. In terms of first degree relatives, her father had pancreatic cancer at the age of 46.  Social: Denies use of tobacco/EtOH/drugs. 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  ROS: A comprehensive 10 system review of systems was completed with the patient and pertinent findings as noted above.  The patient is a 54 year old  female.   Diagnostic Studies History (Melanie Cole, [place]; 09/18/2019 3:49 PM) Colonoscopy  within last year Mammogram  within last year Pap Smear  1-5 years ago  Allergies (Melanie Cole, [name]; 09/18/2019 3:50 PM) AmLODIPine & Diet Manage Prod *CALCIUM CHANNEL BLOCKERS*  ACE Inhibitors  Allergies Reconciled   Medication History (Melanie Cole, [name]; 09/18/2019 3:52 PM) Vitamin D3 (Oral) Specific strength unknown - Active. Melatonin (Oral) Specific strength unknown - Active. metFORMIN HCl (500MG  Tablet, Oral) Active. HydroDiuril (25MG  Tablet, Oral) Active. Medications Reconciled  Social History Melanie Cole, [name]; 09/18/2019 3:49 PM) Caffeine use  Tea. No alcohol use  No drug use  Tobacco use  Never smoker.  Family History Melanie Cole, [place]; 09/18/2019 3:49 PM) Cerebrovascular Accident  Brother. Diabetes Mellitus  Mother. Hypertension  Mother. Malignant Neoplasm Of Pancreas  Father. Thyroid problems  Sister.  Pregnancy / Birth History Melanie Cole, [place]; 09/18/2019 3:49 PM) Age at menarche  43 years. Age of menopause  46-50 Contraceptive History  Oral contraceptives. Gravida  3 Maternal age  40-20 Para  3  Other Problems (Melanie Cole, [place]; 09/18/2019 3:49 PM) Cholelithiasis  Hypercholesterolemia  Other disease, cancer, significant illness     Review of Systems Melanie Gave M. Kasen Adduci MD; 09/18/2019 4:17 PM) General Present- Night Sweats. Not Present- Appetite Loss, Chills, Fatigue, Fever, Weight Gain and Weight Loss. Skin Not Present- Change in Wart/Mole, Dryness, Hives, Jaundice, New Lesions, Non-Healing Wounds, Rash and Ulcer. HEENT Not Present- Earache, Hearing Loss, Hoarseness, Nose Bleed, Oral Ulcers, Ringing in the Ears, Seasonal Allergies, Sinus Pain, Sore Throat, Visual Disturbances, Wears glasses/contact lenses and Yellow Eyes.  Respiratory Not Present- Bloody sputum, Chronic Cough, Difficulty Breathing, Snoring and Wheezing. Breast Not  Present- Breast Mass, Breast Pain, Nipple Discharge and Skin Changes. Cardiovascular Not Present- Chest Pain, Difficulty Breathing Lying Down, Leg Cramps, Palpitations, Rapid Heart Rate, Shortness of Breath and Swelling of Extremities. Gastrointestinal Present- Abdominal Pain, Bloating and Change in Bowel Habits. Not Present- Bloody Stool, Chronic diarrhea, Constipation, Difficulty Swallowing, Excessive gas, Gets full quickly at meals, Hemorrhoids, Indigestion, Nausea, Rectal Pain and Vomiting. Female Genitourinary Present- Nocturia. Not Present- Frequency, Painful Urination, Pelvic Pain and Urgency. Musculoskeletal Present- Back Pain. Not Present- Joint Pain, Joint Stiffness, Muscle Pain, Muscle Weakness and Swelling of Extremities. Neurological Not Present- Decreased Memory, Fainting, Headaches, Numbness, Seizures, Tingling, Tremor, Trouble walking and Weakness. Psychiatric Not Present- Anxiety, Bipolar, Change in Sleep Pattern, Depression, Fearful and Frequent crying. Endocrine Present- Hot flashes. Not Present- Cold Intolerance, Excessive Hunger, Hair Changes, Heat Intolerance and New Diabetes. Hematology Not Present- Blood Thinners, Easy Bruising, Excessive bleeding, Gland problems, HIV and Persistent Infections.  Vitals (Melanie Cole [name]; 09/18/2019 3:52 PM) 09/18/2019 3:52 PM Weight: 188 lb Height: 64in Body Surface Area: 1.91 m Body Mass Index: 32.27 kg/m  Temp.: 97.16F  Pulse: 110 (Regular)  BP: 130/74(Sitting, Left Arm, Standard)       Physical Exam Melanie Gave M. Melanie Harbour MD; 09/18/2019 4:18 PM) The physical exam findings are as follows: Note: Constitutional: No acute distress; conversant; no deformities; wearing mask Eyes: Moist conjunctiva; no lid lag; anicteric sclerae; pupils equal and round Neck: Trachea midline; no palpable thyromegaly Lungs: Normal respiratory effort; no tactile fremitus CV: rrr; no palpable thrill; no pitting edema GI: Abdomen soft,  nontender, nondistended; no palpable hepatosplenomegaly MSK: Normal gait; no clubbing/cyanosis Psychiatric: Appropriate affect; alert and oriented 3 Lymphatic: No palpable cervical or axillary lymphadenopathy **A chaperone, [employer], was present for this encounter    Assessment & Plan Melanie Gave M. Cybil Senegal MD; 09/18/2019 4:22 PM) SYMPTOMATIC CHOLELITHIASIS (K80.20) Story: Ms. Schoenfelder is a very pleasant 81yoF with hx of HTN, GERD, DM here today with symptomatic cholelithiasis Impression: -The anatomy and physiology of the hepatobiliary system was discussed at length with the patient with associated pictures. The pathophysiology of gallbladder disease was discussed at length with associated pictures. -The options for treatment were discussed including ongoing observation which may result in subsequent gallbladder complications (infection, pancreatitis, choledocholithiasis, etc) and surgery - cholecystectomy - laparoscopic and potential open techniques -The planned procedure, material risks (including, but not limited to, pain, bleeding, infection, scarring, need for blood transfusion, damage to surrounding structures- blood vessels/nerves/viscus/organs, damage to bile duct, bile leak, need for additional procedures, hernia, DVT/PE, pancreatitis, pneumonia, heart attack, stroke, death) benefits and alternatives to surgery were discussed at length. I noted a good probability that the procedure would help improve her symptoms - 50-90%. We specifically discussed that there is a 20-30% chance that she may have some persistent abdominal symptoms given her particular case that is not completely classic but she has had a thorough workup that suggests this is the most likely cause. The patient's questions were answered to her satisfaction, she voiced understanding and they elected to proceed with surgery. Additionally, we discussed typical postoperative expectations and the recovery process and time away  for work.  This patient encounter took 35 minutes today to perform the following: take history, perform exam, review outside records, interpret imaging, counsel the patient on their diagnosis and document encounter, findings & plan in the EHR  Signed by Melanie Roup, MD (09/18/2019 4:22 PM)

## 2019-10-25 NOTE — Progress Notes (Signed)
DUE TO COVID-19 ONLY ONE VISITOR IS ALLOWED TO COME WITH YOU AND STAY IN THE WAITING ROOM ONLY DURING PRE OP AND PROCEDURE DAY OF SURGERY. THE 1 VISITOR MAY VISIT WITH YOU AFTER SURGERY IN YOUR PRIVATE ROOM DURING VISITING HOURS ONLY!  YOU NEED TO HAVE A COVID 19 TEST ON_______ @_______ , THIS TEST MUST BE DONE BEFORE SURGERY, COME  [place], [place] , [address].  ([place]) ONCE YOUR COVID TEST IS COMPLETED, PLEASE BEGIN THE QUARANTINE INSTRUCTIONS AS OUTLINED IN YOUR HANDOUT.                Melanie Cole  10/25/2019   Your procedure is scheduled on:  11/07/2019   Report to [facility] Main  Entrance   Report to admitting at   0800 AM     Call this number if you have problems the morning of surgery [phone]    Remember: Do not eat food or drink liquids :After Midnight. BRUSH YOUR TEETH MORNING OF SURGERY AND RINSE YOUR MOUTH OUT, NO CHEWING GUM CANDY OR MINTS.     Take these medicines the morning of surgery with A SIP OF WATER: none   DO NOT TAKE ANY DIABETIC MEDICATIONS DAY OF YOUR SURGERY                               You may not have any metal on your body including hair pins and              piercings  Do not wear jewelry, make-up, lotions, powders or perfumes, deodorant             Do not wear nail polish on your fingernails.  Do not shave  48 hours prior to surgery.     Do not bring valuables to the hospital. [place].  Contacts, dentures or bridgework may not be worn into surgery.  Leave suitcase in the car. After surgery it may be brought to your room.     Patients discharged the day of surgery will not be allowed to drive home. IF YOU ARE HAVING SURGERY AND GOING HOME THE SAME DAY, YOU MUST HAVE AN ADULT TO DRIVE YOU HOME AND BE WITH YOU FOR 24 HOURS. YOU MAY GO HOME BY TAXI OR UBER OR ORTHERWISE, BUT AN ADULT MUST ACCOMPANY YOU HOME AND STAY WITH YOU FOR 24 HOURS.  Name and phone  number of your driver:               Please read over the following fact sheets you were given: _____________________________________________________________________             [facility] - Preparing for Surgery Before surgery, you can play an important role.  Because skin is not sterile, your skin needs to be as free of germs as possible.  You can reduce the number of germs on your skin by washing with CHG (chlorahexidine gluconate) soap before surgery.  CHG is an antiseptic cleaner which kills germs and bonds with the skin to continue killing germs even after washing. Please DO NOT use if you have an allergy to CHG or antibacterial soaps.  If your skin becomes reddened/irritated stop using the CHG and inform your nurse when you arrive at Short Stay. Do not shave (including legs and underarms) for  at least 48 hours prior to the first CHG shower.  You may shave your face/neck. Please follow these instructions carefully:  1.  Shower with CHG Soap the night before surgery and the  morning of Surgery.  2.  If you choose to wash your hair, wash your hair first as usual with your  normal  shampoo.  3.  After you shampoo, rinse your hair and body thoroughly to remove the  shampoo.                           4.  Use CHG as you would any other liquid soap.  You can apply chg directly  to the skin and wash                       Gently with a scrungie or clean washcloth.  5.  Apply the CHG Soap to your body ONLY FROM THE NECK DOWN.   Do not use on face/ open                           Wound or open sores. Avoid contact with eyes, ears mouth and genitals (private parts).                       Wash face,  Genitals (private parts) with your normal soap.             6.  Wash thoroughly, paying special attention to the area where your surgery  will be performed.  7.  Thoroughly rinse your body with warm water from the neck down.  8.  DO NOT shower/wash with your normal soap after using and rinsing off  the  CHG Soap.                9.  Pat yourself dry with a clean towel.            10.  Wear clean pajamas.            11.  Place clean sheets on your bed the night of your first shower and do not  sleep with pets. Day of Surgery : Do not apply any lotions/deodorants the morning of surgery.  Please wear clean clothes to the hospital/surgery center.  FAILURE TO FOLLOW THESE INSTRUCTIONS MAY RESULT IN THE CANCELLATION OF YOUR SURGERY PATIENT SIGNATURE_________________________________  NURSE SIGNATURE__________________________________  ________________________________________________________________________

## 2019-10-30 ENCOUNTER — Other Ambulatory Visit: Payer: Self-pay

## 2019-10-30 ENCOUNTER — Encounter (HOSPITAL_COMMUNITY)
Admission: RE | Admit: 2019-10-30 | Discharge: 2019-10-30 | Disposition: A | Discharge status: Home / Self Care | Payer: 59 | Source: Ambulatory Visit | Attending: Surgery | Admitting: Surgery

## 2019-10-30 ENCOUNTER — Encounter (HOSPITAL_COMMUNITY): Payer: Self-pay

## 2019-10-30 DIAGNOSIS — Z01818 Encounter for other preprocedural examination: Secondary | ICD-10-CM | POA: Diagnosis present

## 2019-10-30 LAB — COMPREHENSIVE METABOLIC PANEL
ALT: 17 U/L (ref 0–44)
AST: 20 U/L (ref 15–41)
Albumin: 4.5 g/dL (ref 3.5–5.0)
Alkaline Phosphatase: 62 U/L (ref 38–126)
Anion gap: 12 (ref 5–15)
BUN: 13 mg/dL (ref 6–20)
CO2: 27 mmol/L (ref 22–32)
Calcium: 9.5 mg/dL (ref 8.9–10.3)
Chloride: 98 mmol/L (ref 98–111)
Creatinine, Ser: 0.53 mg/dL (ref 0.44–1.00)
GFR calc Af Amer: >60 mL/min (ref >60)
GFR calc non Af Amer: >60 mL/min (ref >60)
Glucose, Bld: 98 mg/dL (ref 70–99)
Potassium: 3.3 mmol/L — ABNORMAL LOW (ref 3.5–5.1)
Sodium: 137 mmol/L (ref 135–145)
Total Bilirubin: 1.3 mg/dL — ABNORMAL HIGH (ref 0.3–1.2)
Total Protein: 8.4 g/dL — ABNORMAL HIGH (ref 6.5–8.1)

## 2019-10-30 LAB — GLUCOSE, CAPILLARY
Glucose-Capillary: 65 mg/dL — ABNORMAL LOW (ref 70–99)
Glucose-Capillary: 109 mg/dL — ABNORMAL HIGH (ref 70–99)

## 2019-10-30 LAB — CBC WITH DIFFERENTIAL/PLATELET
Abs Immature Granulocytes: 0.01 10*3/uL (ref 0.00–0.07)
Basophils Absolute: 0 10*3/uL (ref 0.0–0.1)
Basophils Relative: 1 %
Eosinophils Absolute: 0.2 10*3/uL (ref 0.0–0.5)
Eosinophils Relative: 2 %
HCT: 46.9 % — ABNORMAL HIGH (ref 36.0–46.0)
Hemoglobin: 15.2 g/dL — ABNORMAL HIGH (ref 12.0–15.0)
Immature Granulocytes: 0 %
Lymphocytes Relative: 47 %
Lymphs Abs: 3 10*3/uL (ref 0.7–4.0)
MCH: 30 pg (ref 26.0–34.0)
MCHC: 32.4 g/dL (ref 30.0–36.0)
MCV: 92.5 fL (ref 80.0–100.0)
Monocytes Absolute: 0.5 10*3/uL (ref 0.1–1.0)
Monocytes Relative: 7 %
Neutro Abs: 2.9 10*3/uL (ref 1.7–7.7)
Neutrophils Relative %: 43 %
Platelets: 373 10*3/uL (ref 150–400)
RBC: 5.07 MIL/uL (ref 3.87–5.11)
RDW: 13.1 % (ref 11.5–15.5)
WBC: 6.6 10*3/uL (ref 4.0–10.5)
nRBC: 0 % (ref 0.0–0.2)

## 2019-10-30 LAB — HEMOGLOBIN A1C
Hgb A1c MFr Bld: 6 % — ABNORMAL HIGH (ref 4.8–5.6)
Mean Plasma Glucose: 125.5 mg/dL

## 2019-10-30 NOTE — Patient Instructions (Signed)
DUE TO COVID-19 ONLY ONE VISITOR IS ALLOWED TO COME WITH YOU AND STAY IN THE WAITING ROOM ONLY DURING PRE OP AND PROCEDURE DAY OF SURGERY. TWO VISITOR MAY VISIT WITH YOU AFTER SURGERY IN YOUR PRIVATE ROOM DURING VISITING HOURS ONLY!  10a-8p  YOU NEED TO HAVE A COVID 19 TEST ON_8-2-21______ @_______ , THIS TEST MUST BE DONE BEFORE SURGERY, COVID TESTING SITE [address] [place] [address], IT IS ON THE RIGHT GOING OUT [address] APPROXITAM[address]TELY 2 MINUTES PAST [employer] ON THE RIGHT. ONCE YOUR COVID TEST IS COMPLETED,  PLEASE BEGIN THE QUARANTINE INSTRUCTIONS AS OUTLINED IN YOUR HANDOUT.              Melanie Cole  10/30/2019   Your procedure is scheduled on: 11-07-19   Report to [facility] Main  Entrance              Report to admitting at          0800 AM     Call this number if you have problems the morning of surgery [phone]    Remember: Do not eat food or drink liquids :After Midnight.   BRUSH YOUR TEETH MORNING OF SURGERY AND RINSE YOUR MOUTH OUT, NO CHEWING GUM CANDY OR MINTS.     Take these medicines the morning of surgery with A SIP OF WATER: none   DO NOT TAKE ANY DIABETIC MEDICATIONS DAY OF YOUR SURGERY                               You may not have any metal on your body including hair pins and              piercings  Do not wear jewelry, make-up, lotions, powders or perfumes, deodorant             Do not wear nail polish on your fingernails.  Do not shave  48 hours prior to surgery.     Do not bring valuables to the hospital. [place].  Contacts, dentures or bridgework may not be worn into surgery.      Patients discharged the day of surgery will not be allowed to drive home. IF YOU ARE HAVING SURGERY AND GOING HOME THE SAME DAY, YOU MUST HAVE AN ADULT TO DRIVE YOU HOME AND BE WITH YOU FOR 24 HOURS. YOU MAY GO HOME BY TAXI OR UBER OR ORTHERWISE, BUT AN ADULT MUST ACCOMPANY YOU  HOME AND STAY WITH YOU FOR 24 HOURS.  Name and phone number of your driver:               Please read over the following fact sheets you were given: _____________________________________________________________________             [facility] - Preparing for Surgery Before surgery, you can play an important role.  Because skin is not sterile, your skin needs to be as free of germs as possible.  You can reduce the number of germs on your skin by washing with CHG (chlorahexidine gluconate) soap before surgery.  CHG is an antiseptic cleaner which kills germs and bonds with the skin to continue killing germs even after washing. Please DO NOT use if you have an allergy to CHG or antibacterial soaps.  If your skin becomes reddened/irritated stop using  the CHG and inform your nurse when you arrive at Short Stay. Do not shave (including legs and underarms) for at least 48 hours prior to the first CHG shower.  You may shave your face/neck. Please follow these instructions carefully:  1.  Shower with CHG Soap the night before surgery and the  morning of Surgery.  2.  If you choose to wash your hair, wash your hair first as usual with your  normal  shampoo.  3.  After you shampoo, rinse your hair and body thoroughly to remove the  shampoo.                           4.  Use CHG as you would any other liquid soap.  You can apply chg directly  to the skin and wash                       Gently with a scrungie or clean washcloth.  5.  Apply the CHG Soap to your body ONLY FROM THE NECK DOWN.   Do not use on face/ open                           Wound or open sores. Avoid contact with eyes, ears mouth and genitals (private parts).                       Wash face,  Genitals (private parts) with your normal soap.             6.  Wash thoroughly, paying special attention to the area where your surgery  will be performed.  7.  Thoroughly rinse your body with warm water from the neck down.  8.  DO NOT shower/wash  with your normal soap after using and rinsing off  the CHG Soap.                9.  Pat yourself dry with a clean towel.            10.  Wear clean pajamas.            11.  Place clean sheets on your bed the night of your first shower and do not  sleep with pets. Day of Surgery : Do not apply any lotions/deodorants the morning of surgery.  Please wear clean clothes to the hospital/surgery center.  FAILURE TO FOLLOW THESE INSTRUCTIONS MAY RESULT IN THE CANCELLATION OF YOUR SURGERY PATIENT SIGNATURE_________________________________  NURSE SIGNATURE__________________________________  ________________________________________________________________________

## 2019-10-30 NOTE — Progress Notes (Addendum)
PCP - Dr. McDiarmid Cardiologist - no  PPM/ICD -  Device Orders -  Rep Notified -   Chest x-ray -  EKG -  10-31-19 Stress Test -  ECHO -  Cardiac Cath -   Sleep Study -  CPAP -   Fasting Blood Sugar -  Checks Blood Sugar _____ times a day  Blood Thinner Instructions: Aspirin Instructions:  ERAS Protcol -no PRE-SURGERY   COVID TEST- 8-2   Activity- Can walk a flight of stairs without Sob , Walks on treadmill 5-6 days a week.   Anesthesia review: DM , HTN, abn EKG  Patient denies shortness of breath, fever, cough and chest pain at PAT appointment  NONE   All instructions explained to the patient, with a verbal understanding of the material. Patient agrees to go over the instructions while at home for a better understanding. Patient also instructed to self quarantine after being tested for COVID-19. The opportunity to ask questions was provided.

## 2019-10-31 ENCOUNTER — Encounter: Payer: Self-pay | Admitting: Family Medicine

## 2019-11-04 ENCOUNTER — Other Ambulatory Visit (HOSPITAL_COMMUNITY)
Admission: RE | Admit: 2019-11-04 | Discharge: 2019-11-04 | Disposition: A | Discharge status: Home / Self Care | Payer: 59 | Source: Ambulatory Visit | Attending: Surgery | Admitting: Surgery

## 2019-11-04 DIAGNOSIS — Z01812 Encounter for preprocedural laboratory examination: Secondary | ICD-10-CM | POA: Insufficient documentation

## 2019-11-04 DIAGNOSIS — Z20822 Contact with and (suspected) exposure to covid-19: Secondary | ICD-10-CM | POA: Insufficient documentation

## 2019-11-04 LAB — SARS CORONAVIRUS 2 (TAT 6-24 HRS): SARS Coronavirus 2: NEGATIVE

## 2019-11-06 NOTE — Anesthesia Preprocedure Evaluation (Addendum)
Anesthesia Evaluation  Patient identified by MRN, date of birth, ID band Patient awake    Reviewed: Allergy & Precautions, NPO status , Patient's Chart, lab work & pertinent test results  Airway Mallampati: III  TM Distance: >3 FB Neck ROM: Full    Dental  (+) Teeth Intact, Dental Advisory Given   Pulmonary neg pulmonary ROS,    Pulmonary exam normal breath sounds clear to auscultation       Cardiovascular hypertension, Pt. on medications Normal cardiovascular exam Rhythm:Regular Rate:Normal     Neuro/Psych  Headaches, PSYCHIATRIC DISORDERS Depression    GI/Hepatic Neg liver ROS, Symptomatic cholelithiasis    Endo/Other  diabetes, Well Controlled, Type 2, Oral Hypoglycemic AgentsObesity BMI 31 Last a1c 6.0  Renal/GU negative Renal ROS Bladder dysfunction      Musculoskeletal  (+) Arthritis , Osteoarthritis,    Abdominal (+) + obese,   Peds  Hematology negative hematology ROS (+)   Anesthesia Other Findings   Reproductive/Obstetrics negative OB ROS                            Anesthesia Physical Anesthesia Plan  ASA: III  Anesthesia Plan: General   Post-op Pain Management:    Induction: Intravenous  PONV Risk Score and Plan: 4 or greater and Ondansetron, Dexamethasone, Midazolam and Treatment may vary due to age or medical condition  Airway Management Planned: Oral ETT  Additional Equipment: None  Intra-op Plan:   Post-operative Plan: Extubation in OR  Informed Consent: I have reviewed the patients History and Physical, chart, labs and discussed the procedure including the risks, benefits and alternatives for the proposed anesthesia with the patient or authorized representative who has indicated his/her understanding and acceptance.     Dental advisory given  Plan Discussed with: CRNA  Anesthesia Plan Comments:        Anesthesia Quick Evaluation

## 2019-11-07 ENCOUNTER — Ambulatory Visit (HOSPITAL_COMMUNITY): Payer: 59 | Admitting: Physician Assistant

## 2019-11-07 ENCOUNTER — Encounter (HOSPITAL_COMMUNITY)
Admission: RE | Disposition: A | Discharge status: Home / Self Care | Payer: Self-pay | Source: Ambulatory Visit | Attending: Surgery

## 2019-11-07 ENCOUNTER — Encounter (HOSPITAL_COMMUNITY): Payer: Self-pay | Admitting: Surgery

## 2019-11-07 ENCOUNTER — Ambulatory Visit (HOSPITAL_COMMUNITY): Payer: 59 | Admitting: Anesthesiology

## 2019-11-07 ENCOUNTER — Ambulatory Visit (HOSPITAL_COMMUNITY)
Admission: RE | Admit: 2019-11-07 | Discharge: 2019-11-07 | Disposition: A | Discharge status: Home / Self Care | Payer: 59 | Source: Ambulatory Visit | Attending: Surgery | Admitting: Surgery

## 2019-11-07 DIAGNOSIS — Z7984 Long term (current) use of oral hypoglycemic drugs: Secondary | ICD-10-CM | POA: Diagnosis not present

## 2019-11-07 DIAGNOSIS — M1990 Unspecified osteoarthritis, unspecified site: Secondary | ICD-10-CM | POA: Diagnosis not present

## 2019-11-07 DIAGNOSIS — K219 Gastro-esophageal reflux disease without esophagitis: Secondary | ICD-10-CM | POA: Diagnosis not present

## 2019-11-07 DIAGNOSIS — E119 Type 2 diabetes mellitus without complications: Secondary | ICD-10-CM | POA: Diagnosis not present

## 2019-11-07 DIAGNOSIS — K449 Diaphragmatic hernia without obstruction or gangrene: Secondary | ICD-10-CM | POA: Diagnosis not present

## 2019-11-07 DIAGNOSIS — Z79899 Other long term (current) drug therapy: Secondary | ICD-10-CM | POA: Diagnosis not present

## 2019-11-07 DIAGNOSIS — K589 Irritable bowel syndrome without diarrhea: Secondary | ICD-10-CM | POA: Diagnosis not present

## 2019-11-07 DIAGNOSIS — Z6831 Body mass index (BMI) 31.0-31.9, adult: Secondary | ICD-10-CM | POA: Diagnosis not present

## 2019-11-07 DIAGNOSIS — E7800 Pure hypercholesterolemia, unspecified: Secondary | ICD-10-CM | POA: Diagnosis not present

## 2019-11-07 DIAGNOSIS — I10 Essential (primary) hypertension: Secondary | ICD-10-CM | POA: Diagnosis not present

## 2019-11-07 DIAGNOSIS — K8010 Calculus of gallbladder with chronic cholecystitis without obstruction: Secondary | ICD-10-CM | POA: Diagnosis present

## 2019-11-07 DIAGNOSIS — E669 Obesity, unspecified: Secondary | ICD-10-CM | POA: Insufficient documentation

## 2019-11-07 DIAGNOSIS — K317 Polyp of stomach and duodenum: Secondary | ICD-10-CM | POA: Diagnosis not present

## 2019-11-07 HISTORY — PX: CHOLECYSTECTOMY: SHX55

## 2019-11-07 LAB — GLUCOSE, CAPILLARY
Glucose-Capillary: 102 mg/dL — ABNORMAL HIGH (ref 70–99)
Glucose-Capillary: 117 mg/dL — ABNORMAL HIGH (ref 70–99)

## 2019-11-07 SURGERY — LAPAROSCOPIC CHOLECYSTECTOMY
Anesthesia: General

## 2019-11-07 MED ORDER — LIDOCAINE 2% (20 MG/ML) 5 ML SYRINGE
INTRAMUSCULAR | Status: AC
  Filled 2019-11-07: qty 5

## 2019-11-07 MED ORDER — MIDAZOLAM HCL 2 MG/2ML IJ SOLN
INTRAMUSCULAR | Status: DC | PRN
  Administered 2019-11-07: 2 mg via INTRAVENOUS

## 2019-11-07 MED ORDER — DEXAMETHASONE SODIUM PHOSPHATE 10 MG/ML IJ SOLN
INTRAMUSCULAR | Status: DC | PRN
  Administered 2019-11-07: 4 mg via INTRAVENOUS

## 2019-11-07 MED ORDER — FENTANYL CITRATE (PF) 100 MCG/2ML IJ SOLN
INTRAMUSCULAR | Status: DC | PRN
  Administered 2019-11-07 (×3): 50 ug via INTRAVENOUS

## 2019-11-07 MED ORDER — MIDAZOLAM HCL 2 MG/2ML IJ SOLN
INTRAMUSCULAR | Status: AC
  Filled 2019-11-07: qty 2

## 2019-11-07 MED ORDER — LACTATED RINGERS IV SOLN: INTRAVENOUS | Status: DC

## 2019-11-07 MED ORDER — ROCURONIUM BROMIDE 10 MG/ML (PF) SYRINGE
PREFILLED_SYRINGE | INTRAVENOUS | Status: DC | PRN
  Administered 2019-11-07: 50 mg via INTRAVENOUS

## 2019-11-07 MED ORDER — HYDROMORPHONE HCL 1 MG/ML IJ SOLN
0.2500 mg | INTRAMUSCULAR | Status: DC | PRN
  Administered 2019-11-07 (×2): 0.5 mg via INTRAVENOUS

## 2019-11-07 MED ORDER — KETOROLAC TROMETHAMINE 30 MG/ML IJ SOLN
INTRAMUSCULAR | Status: AC
  Filled 2019-11-07: qty 1

## 2019-11-07 MED ORDER — ROCURONIUM BROMIDE 10 MG/ML (PF) SYRINGE
PREFILLED_SYRINGE | INTRAVENOUS | Status: AC
  Filled 2019-11-07: qty 10

## 2019-11-07 MED ORDER — FENTANYL CITRATE (PF) 100 MCG/2ML IJ SOLN
INTRAMUSCULAR | Status: AC
  Filled 2019-11-07: qty 2

## 2019-11-07 MED ORDER — ACETAMINOPHEN 500 MG PO TABS
ORAL_TABLET | ORAL | Status: AC
  Filled 2019-11-07: qty 2

## 2019-11-07 MED ORDER — TRAMADOL HCL 50 MG PO TABS: 50.0000 mg | ORAL_TABLET | Freq: Four times a day (QID) | ORAL | 0 refills | Status: AC | PRN

## 2019-11-07 MED ORDER — BUPIVACAINE-EPINEPHRINE 0.25% -1:200000 IJ SOLN
INTRAMUSCULAR | Status: DC | PRN
  Administered 2019-11-07: 36 mL

## 2019-11-07 MED ORDER — PROMETHAZINE HCL 25 MG/ML IJ SOLN: 6.2500 mg | INTRAMUSCULAR | Status: DC | PRN

## 2019-11-07 MED ORDER — LACTATED RINGERS IV SOLN
INTRAVENOUS | Status: DC | PRN
  Administered 2019-11-07: 250 mL via INTRAVENOUS

## 2019-11-07 MED ORDER — ORAL CARE MOUTH RINSE: 15.0000 mL | Freq: Once | OROMUCOSAL | Status: AC

## 2019-11-07 MED ORDER — CHLORHEXIDINE GLUCONATE CLOTH 2 % EX PADS: 6.0000 | MEDICATED_PAD | Freq: Once | CUTANEOUS | Status: DC

## 2019-11-07 MED ORDER — SUGAMMADEX SODIUM 200 MG/2ML IV SOLN
INTRAVENOUS | Status: DC | PRN
  Administered 2019-11-07: 160 mg via INTRAVENOUS

## 2019-11-07 MED ORDER — ONDANSETRON HCL 4 MG/2ML IJ SOLN
INTRAMUSCULAR | Status: AC
  Filled 2019-11-07: qty 2

## 2019-11-07 MED ORDER — DEXAMETHASONE SODIUM PHOSPHATE 10 MG/ML IJ SOLN
INTRAMUSCULAR | Status: AC
  Filled 2019-11-07: qty 1

## 2019-11-07 MED ORDER — PROPOFOL 10 MG/ML IV BOLUS
INTRAVENOUS | Status: DC | PRN
  Administered 2019-11-07: 150 mg via INTRAVENOUS

## 2019-11-07 MED ORDER — CEFAZOLIN SODIUM-DEXTROSE 2-4 GM/100ML-% IV SOLN
INTRAVENOUS | Status: AC
  Filled 2019-11-07: qty 100

## 2019-11-07 MED ORDER — CHLORHEXIDINE GLUCONATE 0.12 % MT SOLN
15.0000 mL | Freq: Once | OROMUCOSAL | Status: AC
  Administered 2019-11-07: 15 mL via OROMUCOSAL

## 2019-11-07 MED ORDER — LIDOCAINE 2% (20 MG/ML) 5 ML SYRINGE
INTRAMUSCULAR | Status: DC | PRN
  Administered 2019-11-07: 50 mg via INTRAVENOUS

## 2019-11-07 MED ORDER — KETOROLAC TROMETHAMINE 30 MG/ML IJ SOLN
30.0000 mg | Freq: Once | INTRAMUSCULAR | Status: AC | PRN
  Administered 2019-11-07: 30 mg via INTRAVENOUS

## 2019-11-07 MED ORDER — PROPOFOL 10 MG/ML IV BOLUS
INTRAVENOUS | Status: AC
  Filled 2019-11-07: qty 20

## 2019-11-07 MED ORDER — CEFAZOLIN SODIUM-DEXTROSE 2-4 GM/100ML-% IV SOLN
2.0000 g | INTRAVENOUS | Status: AC
  Administered 2019-11-07: 2 g via INTRAVENOUS

## 2019-11-07 MED ORDER — PHENYLEPHRINE 40 MCG/ML (10ML) SYRINGE FOR IV PUSH (FOR BLOOD PRESSURE SUPPORT)
PREFILLED_SYRINGE | INTRAVENOUS | Status: DC | PRN
  Administered 2019-11-07: 80 ug via INTRAVENOUS

## 2019-11-07 MED ORDER — OXYCODONE HCL 5 MG PO TABS: 5.0000 mg | ORAL_TABLET | Freq: Once | ORAL | Status: DC | PRN

## 2019-11-07 MED ORDER — BUPIVACAINE HCL 0.25 % IJ SOLN
INTRAMUSCULAR | Status: AC
  Filled 2019-11-07: qty 1

## 2019-11-07 MED ORDER — HYDROMORPHONE HCL 1 MG/ML IJ SOLN
INTRAMUSCULAR | Status: AC
  Administered 2019-11-07: 0.5 mg via INTRAVENOUS
  Filled 2019-11-07: qty 1

## 2019-11-07 MED ORDER — ACETAMINOPHEN 500 MG PO TABS
1000.0000 mg | ORAL_TABLET | ORAL | Status: AC
  Administered 2019-11-07: 1000 mg via ORAL

## 2019-11-07 MED ORDER — ONDANSETRON HCL 4 MG/2ML IJ SOLN
INTRAMUSCULAR | Status: DC | PRN
  Administered 2019-11-07: 4 mg via INTRAVENOUS

## 2019-11-07 MED ORDER — OXYCODONE HCL 5 MG/5ML PO SOLN: 5.0000 mg | Freq: Once | ORAL | Status: DC | PRN

## 2019-11-07 MED ORDER — ACETAMINOPHEN 500 MG PO TABS: 1000.0000 mg | ORAL_TABLET | Freq: Once | ORAL | Status: DC

## 2019-11-07 SURGICAL SUPPLY — 48 items
ADH SKN CLS APL DERMABOND .7 (GAUZE/BANDAGES/DRESSINGS) ×1
APL PRP STRL LF DISP 70% ISPRP (MISCELLANEOUS) ×1
APL SRG 38 LTWT LNG FL B (MISCELLANEOUS)
APPLICATOR ARISTA FLEXITIP XL (MISCELLANEOUS) IMPLANT
APPLIER CLIP 5 13 M/L LIGAMAX5 (MISCELLANEOUS) ×2
APPLIER CLIP ROT 10 11.4 M/L (STAPLE)
APR CLP MED LRG 11.4X10 (STAPLE)
APR CLP MED LRG 5 ANG JAW (MISCELLANEOUS) ×1
BAG SPEC RTRVL LRG 6X4 10 (ENDOMECHANICALS) ×1
CABLE HIGH FREQUENCY MONO STRZ (ELECTRODE) ×2 IMPLANT
CHLORAPREP W/TINT 26 (MISCELLANEOUS) ×2 IMPLANT
CLIP APPLIE 5 13 M/L LIGAMAX5 (MISCELLANEOUS) ×1 IMPLANT
CLIP APPLIE ROT 10 11.4 M/L (STAPLE) IMPLANT
COVER MAYO STAND STRL (DRAPES) IMPLANT
COVER SURGICAL LIGHT HANDLE (MISCELLANEOUS) ×2 IMPLANT
COVER WAND RF STERILE (DRAPES) IMPLANT
DECANTER SPIKE VIAL GLASS SM (MISCELLANEOUS) ×2 IMPLANT
DERMABOND ADVANCED (GAUZE/BANDAGES/DRESSINGS) ×1
DERMABOND ADVANCED .7 DNX12 (GAUZE/BANDAGES/DRESSINGS) ×1 IMPLANT
DISSECTOR BLUNT TIP ENDO 5MM (MISCELLANEOUS) IMPLANT
DRAPE C-ARM 42X120 X-RAY (DRAPES) IMPLANT
ELECT PENCIL ROCKER SW 15FT (MISCELLANEOUS) ×2 IMPLANT
ELECT REM PT RETURN 15FT ADLT (MISCELLANEOUS) ×2 IMPLANT
GLOVE BIO SURGEON STRL SZ7.5 (GLOVE) ×2 IMPLANT
GLOVE INDICATOR 8.0 STRL GRN (GLOVE) ×2 IMPLANT
GOWN STRL REUS W/TWL XL LVL3 (GOWN DISPOSABLE) ×4 IMPLANT
GRASPER SUT TROCAR 14GX15 (MISCELLANEOUS) ×1 IMPLANT
HEMOSTAT ARISTA ABSORB 3G PWDR (HEMOSTASIS) IMPLANT
HEMOSTAT SNOW SURGICEL 2X4 (HEMOSTASIS) IMPLANT
KIT BASIN OR (CUSTOM PROCEDURE TRAY) ×2 IMPLANT
NDL INSUFFLATION 14GA 120MM (NEEDLE) IMPLANT
NEEDLE INSUFFLATION 14GA 120MM (NEEDLE) ×2 IMPLANT
POUCH SPECIMEN RETRIEVAL 10MM (ENDOMECHANICALS) ×2 IMPLANT
SCISSORS LAP 5X35 DISP (ENDOMECHANICALS) ×2 IMPLANT
SET CHOLANGIOGRAPH MIX (MISCELLANEOUS) IMPLANT
SET IRRIG TUBING LAPAROSCOPIC (IRRIGATION / IRRIGATOR) ×2 IMPLANT
SET TUBE SMOKE EVAC HIGH FLOW (TUBING) ×2 IMPLANT
SLEEVE ADV FIXATION 5X100MM (TROCAR) ×4 IMPLANT
SUT MNCRL AB 4-0 PS2 18 (SUTURE) ×2 IMPLANT
SYR 10ML ECCENTRIC (SYRINGE) ×2 IMPLANT
TOWEL OR 17X26 10 PK STRL BLUE (TOWEL DISPOSABLE) ×2 IMPLANT
TOWEL OR NON WOVEN STRL DISP B (DISPOSABLE) IMPLANT
TRAY LAPAROSCOPIC (CUSTOM PROCEDURE TRAY) ×2 IMPLANT
TROCAR ADV FIXATION 12X100MM (TROCAR) ×1 IMPLANT
TROCAR ADV FIXATION 5X100MM (TROCAR) ×2 IMPLANT
TROCAR BLADELESS OPT 5 100 (ENDOMECHANICALS) ×1 IMPLANT
TROCAR XCEL BLUNT TIP 100MML (ENDOMECHANICALS) ×2 IMPLANT
TROCAR XCEL NON-BLD 11X100MML (ENDOMECHANICALS) ×1 IMPLANT

## 2019-11-07 NOTE — Anesthesia Postprocedure Evaluation (Signed)
Anesthesia Post Note  Patient: Melanie Cole  Procedure(s) Performed: LAPAROSCOPIC CHOLECYSTECTOMY (N/A )     Patient location during evaluation: PACU Anesthesia Type: General Level of consciousness: awake and alert, oriented and patient cooperative Pain management: pain level controlled Vital Signs Assessment: post-procedure vital signs reviewed and stable Respiratory status: spontaneous breathing, nonlabored ventilation and respiratory function stable Cardiovascular status: blood pressure returned to baseline and stable Postop Assessment: no apparent nausea or vomiting Anesthetic complications: no   No complications documented.  Last Vitals:  Vitals:   11/07/19 1311 11/07/19 1315  BP:  139/79  Pulse: (!) 51 (!) 59  Resp: 10 16  Temp:  36.6 C  SpO2: 96% 97%    Last Pain:  Vitals:   11/07/19 1315  TempSrc:   PainSc: 0-No pain                 Pervis Hocking

## 2019-11-07 NOTE — Anesthesia Procedure Notes (Addendum)
Procedure Name: Intubation Date/Time: 11/07/2019 10:06 AM Performed by: Niel Hummer, CRNA Pre-anesthesia Checklist: Patient identified, Emergency Drugs available, Suction available and Patient being monitored Patient Re-evaluated:Patient Re-evaluated prior to induction Oxygen Delivery Method: Circle system utilized Preoxygenation: Pre-oxygenation with 100% oxygen Induction Type: IV induction Ventilation: Mask ventilation without difficulty Laryngoscope Size: Glidescope and 3 Grade View: Grade I Tube type: Oral Tube size: 7.0 mm Number of attempts: 1 Airway Equipment and Method: Video-laryngoscopy and Stylet Placement Confirmation: ETT inserted through vocal cords under direct vision,  positive ETCO2 and breath sounds checked- equal and bilateral Secured at: 22 cm Tube secured with: Tape Dental Injury: Teeth and Oropharynx as per pre-operative assessment  Comments: DLx1, grade 3 view. Mask ventilation. Glidescope S3 used, grade 1 view. Tube passed easily.

## 2019-11-07 NOTE — H&P (Signed)
CC: Referred by Dr. Havery Moros for symptomatic cholelithiasis  HPI: Melanie Cole is a very pleasant 54yoF with hx of HTN, DM, GERD and presents to our office for evaluation of possible symptomatic cholelithiasis. She reports a history of proximal in 6 months of duration of intermittent midepigastric abdominal discomforts and pains that radiate to her right back. There is no clear association with food intake with these. They had initially occurred up to 6 times a day lasting anywhere from minutes to hours but over the last few weeks have been occurring on average of 2 times per day. The pain is described as sharp. She denies any nausea or vomiting. She denies any fever or chills. She has been seen by gastroenterology, Dr. Havery Moros admits she is also trialed omeprazole with no help with regards to her symptoms. She will have occasional issues with some bloating as well as mild constipation but hasn't been suffering from any of this lately.  She underwent EGD 08/05/19 that showed a very small 1 cm hiatal hernia. 3 mm gastric polyp. Biopsies did not reveal Helicobacter pylori.  She underwent right upper quadrant ultrasound 08/16/18 on admission demonstrates multiple gallstones up to 1.4 cm there are mobile. All bile duct measured 3 mm. No gallbladder wall thickening or pericholecystic fluid.  Last blood work 05/2019 - normal LFTs and lipase; wbc 7.6, plt 384  PMH: HTN, DM, GERD  PSH: BTL; denies any other abdominal or pelvic surgical history  FHx: Multiple remote relatives with cancer history. In terms of first degree relatives, her father had pancreatic cancer at the age of 56.  Social: Denies use of tobacco/EtOH/drugs. 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  ROS: A comprehensive 10 system review of systems was completed with the patient and pertinent findings as noted above.  Past Medical History:  Diagnosis  Date  . Adenomyosis   . Atopic dermatitis 12/18/2012  . Bloating 12/10/2015  . Chest pain, unspecified 12/16/2013  . Constipation   . Diabetes mellitus without complication ([place])   . Essential hypertension, benign   . Hemangioma of liver 09/10/2010   09/09/10 Abdominopelvic CT found incidental 1.8 cm round mass in the left lobe of the liver which is likely a solitary cavernous hemangioma    . High blood pressure   . High cholesterol   . History of gestational diabetes   . History of postmenopausal bleeding 12/16/2013  . History of pulmonary function tests 2002   Normal values.  . Hypertension   . IBS (irritable bowel syndrome)   . Kidney cyst, acquired    left  . Lesion of liver 02/13/2015  . MIGRAINE, UNSPEC., W/O INTRACTABLE MIGRAINE 06/01/2006       . Mondor's disease   . Paresthesias 12/10/2015  . Patellar tendinitis/bursitis 12/18/2012  . Postmenopause   . Pre-diabetes   . Urinary, incontinence, stress female 04/08/2014  . Uterus, adenomyosis    Per patient's recall  . Vasomotor flushing 12/16/2013  . Vitamin D deficiency     Past Surgical History:  Procedure Laterality Date  . Bladder "Stretched"    . CARDIOVASCULAR STRESS TEST  12/03/2000   Stress Echocargiogram - 12/03/2000, NORMAL  . COLONOSCOPY  05/05/2000    Dr Lyndel Safe Tia Alert)- Internal Hemorrhoids  . SPIROMETRY  12/03/2000   Normal Pulmonary Function Testing   . TUBAL LIGATION      Family History  Problem Relation Age of Onset  . Pancreatic cancer Father  Died age 26  . Arrhythmia Father        Required a permanent pacemaker  . Diabetes Father   . Colon cancer Cousin        Colorectal Cancer  . Diabetes Mother   . Thyroid disease Mother   . High blood pressure Mother   . High Cholesterol Mother   . Colon cancer Paternal Aunt   . Breast cancer Cousin   . Lung cancer Maternal Uncle   . Colon cancer Maternal Uncle   . Lung cancer Paternal Uncle     Social:  reports that she has never smoked. She has never  used smokeless tobacco. She reports that she does not drink alcohol and does not use drugs.  Allergies:  Allergies  Allergen Reactions  . Ace Inhibitors Other (See Comments)    Atopic rash  . Amlodipine Besylate Other (See Comments)    Peripheral edema and dry mouth.     Medications: I have reviewed the patient's current medications.  Results for orders placed or performed during the hospital encounter of 11/07/19 (from the past 48 hour(s))  Glucose, capillary     Status: Abnormal   Collection Time: 11/07/19  8:31 AM  Result Value Ref Range   Glucose-Capillary 102 (H) 70 - 99 mg/dL    Comment: Glucose reference range applies only to samples taken after fasting for at least 8 hours.   Comment 1 Notify RN    Comment 2 Document in Chart     No results found.  ROS - all of the below systems have been reviewed with the patient and positives are indicated with bold text General: chills, fever or night sweats Eyes: blurry vision or double vision ENT: epistaxis or sore throat Allergy/Immunology: itchy/watery eyes or nasal congestion Hematologic/Lymphatic: bleeding problems, blood clots or swollen lymph nodes Endocrine: temperature intolerance or unexpected weight changes Breast: new or changing breast lumps or nipple discharge Resp: cough, shortness of breath, or wheezing CV: chest pain or dyspnea on exertion GI: as per HPI GU: dysuria, trouble voiding, or hematuria MSK: joint pain or joint stiffness Neuro: TIA or stroke symptoms Derm: pruritus and skin lesion changes Psych: anxiety and depression  PE Blood pressure 134/89, pulse 66, temperature 98.4 F (36.9 C), temperature source Oral, resp. rate 18, last menstrual period 11/16/2012, SpO2 100 %. Constitutional: NAD; wearing mask Eyes: Moist conjunctiva; no lid lag; anicteric Lungs: Normal respiratory effort CV: RRR; no pitting edema GI: Abd soft, NT/ND; no palpable hepatosplenomegaly MSK: Normal range of motion of  extremities Psychiatric: Appropriate affect; alert and oriented x3 Lymphatic: No palpable cervical or axillary lymphadenopathy  Results for orders placed or performed during the hospital encounter of 11/07/19 (from the past 48 hour(s))  Glucose, capillary     Status: Abnormal   Collection Time: 11/07/19  8:31 AM  Result Value Ref Range   Glucose-Capillary 102 (H) 70 - 99 mg/dL    Comment: Glucose reference range applies only to samples taken after fasting for at least 8 hours.   Comment 1 Notify RN    Comment 2 Document in Chart     No results found.   A/P: Melanie Cole is a very pleasant 55yoF with hx of HTN, GERD, DM here today with symptomatic cholelithiasis  -The anatomy and physiology of the hepatobiliary system was discussed at length with the patient with associated pictures. The pathophysiology of gallbladder disease was discussed at length as well -We have again reviewed cholecystectomy - laparoscopic and potential open techniques -  The planned procedure, material risks (including, but not limited to, pain, bleeding, infection, scarring, need for blood transfusion, damage to surrounding structures- blood vessels/nerves/viscus/organs, damage to bile duct, bile leak, need for additional procedures, hernia, DVT/PE, pancreatitis, pneumonia, heart attack, stroke, death) benefits and alternatives to surgery were discussed at length. I noted a good probability that the procedure would help improve her symptoms - 50-90%. We specifically discussed that there is a 20-30% chance that she may have some persistent abdominal symptoms given her particular case that is not completely classic but she has had a thorough workup that suggests this is the most likely cause. The patient's questions were answered to her satisfaction, she voiced understanding and they elected to proceed with surgery. Additionally, we discussed typical postoperative expectations and the recovery process and time away for  work.  Sharon Mt. Dema Severin, M.D. [facility]

## 2019-11-07 NOTE — Discharge Instructions (Signed)
POST OP INSTRUCTIONS  1. DIET: As tolerated. Follow a light bland diet the first 24 hours after arrival home, such as soup, liquids, crackers, etc.  Be sure to include lots of fluids daily.  Avoid fast food or heavy meals as your are more likely to get nauseated.  Eat a low fat the next few days after surgery.  2. Take your usually prescribed home medications unless otherwise directed.  3. PAIN CONTROL: a. Pain is best controlled by a usual combination of three different methods TOGETHER: i. Ice/Heat ii. Over the counter pain medication iii. Prescription pain medication b. Most patients will experience some swelling and bruising around the surgical site.  Ice packs or heating pads (30-60 minutes up to 6 times a day) will help. Some people prefer to use ice alone, heat alone, alternating between ice & heat.  Experiment to what works for you.  Swelling and bruising can take several weeks to resolve.   c. It is helpful to take an over-the-counter pain medication regularly for the first few weeks: i. Ibuprofen (Motrin/Advil) - 200mg  tabs - take 3 tabs (600mg ) every 6 hours as needed for pain ii. Acetaminophen (Tylenol) - you may take 650mg  every 6 hours as needed. You can take this with motrin as they act differently on the body. If you are taking a narcotic pain medication that has acetaminophen in it, do not take over the counter tylenol at the same time.  Iii. NOTE: You may take both of these medications together - most patients  find it most helpful when alternating between the two (i.e. Ibuprofen at 6am, tylenol at 9am, ibuprofen at 12pm ...) d. A  prescription for pain medication should be given to you upon discharge.  Take your pain medication as prescribed if your pain is not adequatly controlled with the over-the-counter pain reliefs mentioned above.  4. Avoid getting constipated.  Between the surgery and the pain medications, it is common to experience some constipation.  Increasing fluid  intake and taking a fiber supplement (such as Metamucil, Citrucel, FiberCon, MiraLax, etc) 1-2 times a day regularly will usually help prevent this problem from occurring.  A mild laxative (prune juice, Milk of Magnesia, MiraLax, etc) should be taken according to package directions if there are no bowel movements after 48 hours.    5. Dressing: Your incision are covered in Dermabond which is like sterile superglue for the skin. This will come off on it's own in a couple weeks. It is waterproof and you may bathe normally starting the day after your surgery in a shower. Avoid baths/pools/lakes/oceans until your wounds have fully healed.  6. ACTIVITIES as tolerated:   a. Avoid heavy lifting (>10lbs or 1 gallon of milk) for the next 6 weeks. b. You may resume regular (light) daily activities beginning the next day--such as daily self-care, walking, climbing stairs--gradually increasing activities as tolerated.  If you can walk 30 minutes without difficulty, it is safe to try more intense activity such as jogging, treadmill, bicycling, low-impact aerobics.  c. DO NOT PUSH THROUGH PAIN.  Let pain be your guide: If it hurts to do something, don't do it. d. Dennis Bast may drive when you are no longer taking prescription pain medication, you can comfortably wear a seatbelt, and you can safely maneuver your car and apply brakes.  7. FOLLOW UP in our office a. Please call [facility] at [phone] to set up an appointment to see your surgeon in the office for a follow-up appointment approximately  2 weeks after your surgery. b. Make sure that you call for this appointment the day you arrive home to insure a convenient appointment time.  9. If you have disability or family leave forms that need to be completed, you may have them completed by your primary care physician's office; for return to work instructions, please ask our office staff and they will be happy to assist you in obtaining this documentation   When to call us  [phone]: 1. Poor pain control 2. Reactions / problems with new medications (rash/itching, etc)  3. Fever over 101.5 F (38.5 C) 4. Inability to urinate 5. Nausea/vomiting 6. Worsening swelling or bruising 7. Continued bleeding from incision. 8. Increased pain, redness, or drainage from the incision  The clinic staff is available to answer your questions during regular business hours (8:30am-5pm).  Please don't hesitate to call and ask to speak to one of our nurses for clinical concerns.   A surgeon from [facility] is always on call at the hospitals   If you have a medical emergency, go to the nearest emergency room or call 911.  [facility], [place] [address], [place], [place], [place]  [address] MAIN: [phone] FAX: [phone] [email or web address]

## 2019-11-07 NOTE — Transfer of Care (Signed)
Immediate Anesthesia Transfer of Care Note  Patient: Melanie Cole  Procedure(s) Performed: LAPAROSCOPIC CHOLECYSTECTOMY (N/A )  Patient Location: PACU  Anesthesia Type:General  Level of Consciousness: awake, alert  and oriented  Airway & Oxygen Therapy: Patient Spontanous Breathing and Patient connected to face mask oxygen  Post-op Assessment: Report given to RN, Post -op Vital signs reviewed and stable and Patient moving all extremities X 4  Post vital signs: Reviewed and stable  Last Vitals:  Vitals Value Taken Time  BP    Temp    Pulse 67 11/07/19 1135  Resp 14 11/07/19 1135  SpO2 98 % 11/07/19 1135  Vitals shown include unvalidated device data.  Last Pain:  Vitals:   11/07/19 0834  TempSrc: Oral  PainSc: 0-No pain      Patients Stated Pain Goal: 3 (20/94/70 9628)  Complications: No complications documented.

## 2019-11-07 NOTE — Op Note (Signed)
11/07/2019 11:19 AM  PATIENT: Melanie Cole  54 y.o. female  Patient Care Team: McDiarmid, Blane Ohara, MD as PCP - General Clementeen Graham, MD as Consulting Physician (Internal Medicine) Armbruster, Carlota Raspberry, MD as Consulting Physician (Gastroenterology)  PRE-OPERATIVE DIAGNOSIS: Symptomatic cholelithiasis  POST-OPERATIVE DIAGNOSIS: Same  PROCEDURE: Laparoscopic cholecystectomy  SURGEON: Sharon Mt. Zakhia Seres, MD  ANESTHESIA: General endotracheal  EBL: Total I/O In: 100 [IV Piggyback:100] Out: -  39mL  DRAINS: None  SPECIMEN: Gallbladder  COUNTS: Sponge, needle and instrument counts were reported correct x2 at the conclusion of the operation  DISPOSITION: PACU in satisfactory condition  COMPLICATIONS: None  FINDINGS: A few omental adhesions around the umbilicus taken down sharply. Likely prior cholecystitis - omental adhesions to gallbladder. Critical view of safety was achieved prior to clipping or dividing and structure.  INDICATION: Melanie Cole is a very pleasant 52yoF with hx of HTN, DM, GERD and presented to our office for evaluation of possible symptomatic cholelithiasis. She reported a history approximately 6 months of duration of intermittent midepigastric abdominal discomforts and pains that radiate to her right back. There was no clear association with food intake with these. This had initially occurred up to 6 times a day lasting anywhere from minutes to hours but over the last few weeks has been occurring on average of 2 times per day. The pain is described as sharp. She denies any nausea or vomiting. She denies any fever or chills. She has been seen by gastroenterology, Dr. Havery Cole admits she has also trialed omeprazole with no help with regards to her symptoms. She will have occasional issues with some bloating as well as mild constipation but hasn't been suffering from any of this lately.  She underwent EGD 08/05/19 that showed a very small 1 cm hiatal hernia. 3  mm gastric polyp. Biopsies did not reveal Helicobacter pylori.   She underwent right upper quadrant ultrasound 08/16/18 on admission demonstrates multiple gallstones up to 1.4 cm there are mobile. All bile duct measured 3 mm. No gallbladder wall thickening or pericholecystic fluid.  Last blood work 05/2019 - normal LFTs and lipase; wbc 7.6, plt 384  After meeting and evaluating her and reviewing everything, she opted to pursue surgery. Please refer to notes elsewhere for details regarding this discussion.  DESCRIPTION:  The patient was identified & brought into the operating room. She was then positioned supine on the OR table. SCDs were in place and active during the entire case. She then underwent general endotracheal anesthesia. Pressure points were padded. The abdomen was prepped and draped in the standard sterile fashion. Antibiotics were administered. A surgical timeout was performed and confirmed our plan.   Given prior umbilical BTL, decision was made to proceed with Veress needle entry away from prior incision. An OG tube was placed by anesthesia and confirmed to be to suction.  In the right upper quadrant, a Veress needle was carefully introduced into the peritoneal cavity on the first attempt.  Intraperitoneal location was confirmed with the aspiration and saline drop test.  Pneumoperitoneum was then established to a maximum pressure of 15 mmHg with CO2.  A 5 mm Optiview trocar was placed under direct visualization into the peritoneal cavity at this location.  The laparoscope was then used to survey the abdomen demonstrated no evidence of Veress needle or trocar site complications.  She did have a small burden of omental adhesions to the umbilical area from her prior surgery.  The 5 mm trocar was then placed under direct visualization in  the right lateral abdomen just beneath the subcostal margin.  An 11 mm trocar was then placed in the subxiphoid position obliquely.  Using EndoShears  scissors, the omental adhesions to the umbilical midline were carefully taken down sharply.  A 5 mm trocar was then placed under direct visualization at this location.  The laparoscope was switched to this trocar.  The liver and gallbladder were inspected.  There were chronic appearing adhesions of omentum to the gallbladder that were carefully taken down sharply. The gallbladder fundus was grasped and elevated cephalad. An additional grasper was then placed on the infundibulum of the gallbladder and the infundibulum was retracted laterally. Staying high on the gallbladder, the peritoneum on both sides of the gallbladder was opened with hook cautery. Gentle blunt dissection was then employed with a IT consultant working down into [employer]. The cystic duct was identified and carefully circumferentially dissected. The cystic artery was also identified and carefully circumferentially dissected. The space between the cystic artery and hepatocystic plate was developed such that a good view of the liver could be seen through a window medial to the cystic artery. The triangle of Calot had been cleared of all fibrofatty tissue. At this point, a critical view of safety was achieved and the only structures visualized was the skeletonized cystic duct laterally, the skeletonized cystic artery and the liver through the window medial to the artery. No posterior cystic artery was noted  A cholangiogram was not performed at this point as her cystic duct did appear quite diminutive and there was concern for avulsion with a partial 'ductotomy.'  The cystic duct and artery were clipped with 2 clips on the patient side and 1 clip on the specimen side. The cystic duct and artery were then divided. The gallbladder was then freed from its remaining attachments to the liver using electrocautery and placed into an endocatch bag. The RUQ was gently irrigated with sterile saline. Hemostasis was then verified. The clips were  in good position; the gallbladder fossa was dry. The rest of the abdomen was inspected no injury nor bleeding elsewhere was identified.  The endocatch bag containing the gallbladder was then removed from the subxiphoid port site and passed off as specimen.  Using a laparoscopic suture passer, a 0 Vicryl suture was placed to close the 11 mm port site.  The fascia was then palpated and noted to be completely closed. The RUQ ports were removed under direct visualization and noted to be hemostatic.  All sponge, needle, and instrument counts have been reported correct x2.  The skin of all incision sites was approximated with 4-0 monocryl subcuticular suture and dermabond applied. She was then awakened from anesthesia, extubated, and transferred to a stretcher for transport to PACU in satisfactory condition.

## 2019-11-08 ENCOUNTER — Encounter (HOSPITAL_COMMUNITY): Payer: Self-pay | Admitting: Surgery

## 2019-11-08 LAB — SURGICAL PATHOLOGY

## 2020-03-06 ENCOUNTER — Telehealth: Payer: Self-pay | Admitting: *Deleted

## 2020-03-06 NOTE — Telephone Encounter (Signed)
Pt states that she has had some "weird" headaches. Denies CP, SOB, Nausea, Jaw pain or blurry vision.  Yesterday - 175/103 Today - 143/108  She is agreeable to appt, scheduled with Dr. Erin Hearing on Tuesday as She will need time to let her employer know.    Red flags given to go to [facility] / ED. Christen Bame, [name]

## 2020-03-10 ENCOUNTER — Encounter: Payer: Self-pay | Admitting: Family Medicine

## 2020-03-10 ENCOUNTER — Ambulatory Visit (INDEPENDENT_AMBULATORY_CARE_PROVIDER_SITE_OTHER): Payer: 59 | Admitting: Family Medicine

## 2020-03-10 ENCOUNTER — Other Ambulatory Visit: Payer: Self-pay

## 2020-03-10 DIAGNOSIS — I10 Essential (primary) hypertension: Secondary | ICD-10-CM

## 2020-03-10 DIAGNOSIS — R202 Paresthesia of skin: Secondary | ICD-10-CM | POA: Diagnosis not present

## 2020-03-10 NOTE — Assessment & Plan Note (Signed)
Elevated mildly today and more significantly at home.  Asked to obtain blood pressure cuff and monitor - see after visit summary

## 2020-03-10 NOTE — Patient Instructions (Addendum)
Good to see you today!  Thanks for coming in.  I will call you if your tests are not good.  Otherwise, I will send you a message on MyChart (if it is active) or a letter in the mail..  If you do not hear from me with in 2 weeks please call our office.   Your goal blood pressure is less than 140/90.  Check your blood pressure several times a week.  If regularly higher than this please let me know - either with MyChart or leaving a phone message. Next visit please bring in your blood pressure cuff.    If any persistent chest pain or shortness of breath or limb pain let us know

## 2020-03-10 NOTE — Progress Notes (Signed)
    SUBJECTIVE:   CHIEF COMPLAINT / HPI:   TINGLING PAIN SENSATIONS For last week or so intermittent, lasting less than a minute, hard to describe sensations of tingling or pulsing in her scalp, neck, L arm and calves.   No persistent pain or swelling or any shortness of breath or chest pain or limitations to activities.  No muscle cramping She asked about checking a ddimer.  We discussed lack of any symptoms consistent with  PE or DVT and risk of false positive.  Decided not to obtain.  HYPERTENSION Rarely checks her blood pressure but at home last Friday was high (see recent phone note) and at work recently ws 175/89 as taken by a collegue.  Takes HCTZ regularly except missed 2 doses around Thanksgiving.  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  No murmurs, gallops or rubs.    Lungs:  Normal respiratory effort, chest expands symmetrically. Lungs are clear to auscultation, no crackles or wheezes. Neck:  No deformities, thyromegaly, masses, or tenderness noted.   Supple with full range of motion without pain. Eye - Pupils Equal Round Reactive to light, Extraocular movements intact, Fundi without hemorrhage or visible lesions, Conjunctiva without redness or discharge Neurologic exam : Cn 2-7 intact Strength equal & normal in upper & lower extremities Able to walk on heels and toes.   Balance normal  Romberg normal, Extremities:  No cyanosis, edema, or deformity noted with good range of motion of all major joints.     ASSESSMENT/PLAN:   Essential hypertension, benign Elevated mildly today and more significantly at home.  Asked to obtain blood pressure cuff and monitor - see after visit summary   Paresthesias Having diffuse  fleeting abnormal sensations with normal exam.  Most consistent with possible electrolyte abnormality perhaps caused by HCTZ or high blood sugar.  Also has history of abnormal calcium in the past.  Will check labs.  No evidence of ACS, PE, stroke.  Will monitor      Lind Covert, MD [place]

## 2020-03-10 NOTE — Assessment & Plan Note (Signed)
Having diffuse fleeting abnormal sensations with normal exam.  Most consistent with possible electrolyte abnormality perhaps caused by HCTZ or high blood sugar.  Also has history of abnormal calcium in the past.  Will check labs.  No evidence of ACS, PE, stroke.  Will monitor

## 2020-03-11 ENCOUNTER — Telehealth: Payer: Self-pay

## 2020-03-11 DIAGNOSIS — I10 Essential (primary) hypertension: Secondary | ICD-10-CM

## 2020-03-11 LAB — CMP14+EGFR
ALT: 20 IU/L (ref 0–32)
AST: 22 IU/L (ref 0–40)
Albumin/Globulin Ratio: 1.4 (ref 1.2–2.2)
Albumin: 4.7 g/dL (ref 3.8–4.9)
Alkaline Phosphatase: 81 IU/L (ref 44–121)
BUN/Creatinine Ratio: 23 (ref 9–23)
BUN: 15 mg/dL (ref 6–24)
Bilirubin Total: 0.4 mg/dL (ref 0.0–1.2)
CO2: 23 mmol/L (ref 20–29)
Calcium: 9.5 mg/dL (ref 8.7–10.2)
Chloride: 100 mmol/L (ref 96–106)
Creatinine, Ser: 0.65 mg/dL (ref 0.57–1.00)
GFR calc Af Amer: 116 mL/min/{1.73_m2} (ref >59)
GFR calc non Af Amer: 101 mL/min/{1.73_m2} (ref >59)
Globulin, Total: 3.3 g/dL (ref 1.5–4.5)
Glucose: 77 mg/dL (ref 65–99)
Potassium: 4.3 mmol/L (ref 3.5–5.2)
Sodium: 143 mmol/L (ref 134–144)
Total Protein: 8 g/dL (ref 6.0–8.5)

## 2020-03-11 LAB — CBC
Hematocrit: 44.1 % (ref 34.0–46.6)
Hemoglobin: 14.9 g/dL (ref 11.1–15.9)
MCH: 30.7 pg (ref 26.6–33.0)
MCHC: 33.8 g/dL (ref 31.5–35.7)
MCV: 91 fL (ref 79–97)
Platelets: 362 10*3/uL (ref 150–450)
RBC: 4.85 x10E6/uL (ref 3.77–5.28)
RDW: 13.1 % (ref 11.7–15.4)
WBC: 6.7 10*3/uL (ref 3.4–10.8)

## 2020-03-11 NOTE — Telephone Encounter (Signed)
Patient calls nurse line reporting continued high blood pressures. Patient reports she was seen by Chambliss on 12/7 and "nothing was done about it." Patient reports she would like to move forward with only speaking with McDiarmid about her high blood pressures. Patient reports, "no one asked me yesterday what my BPs were at home." Patient reports she has been compliant with HCTZ except for missing "one or two a few weeks ago." Patient did state she does not have her own BP cuff, however she ordered one today from [employer]. Patient reports the below readings came from [facility]. Patient is requesting an added BP med by PCP. I have scheduled her for PCP first available 04/16/2020. Please advise on adding a medication.  12/2: 175-103 12/3: 149/108 12/4: 176/110 12/6: 181/99

## 2020-03-12 ENCOUNTER — Ambulatory Visit: Payer: 59 | Admitting: Family Medicine

## 2020-03-13 MED ORDER — METOPROLOL SUCCINATE ER 50 MG PO TB24: 50.0000 mg | ORAL_TABLET | Freq: Every day | ORAL | 2 refills | Status: DC

## 2020-03-13 NOTE — Telephone Encounter (Signed)
Discussed persistent elevated BP readings at work ([employer] by another Therapist, sports) and in community (Pharmacy, both automated and manual by pharmacist) Hisotry of leg edema and dry mouth with Amlodipine and very itchy rash with ACEI.   PLan Start Metoprolol succinate 50 mg daily #30 RF x2 If BPs not improving then I asked Ms Sibert to send me a report over [place].  If improving, then will wait for her office visit in January with me to follow up therapy.

## 2020-03-13 NOTE — Telephone Encounter (Signed)
Patient calls nurse line checking the status of PCP recommendations. Patient reports she took her BP at [facility] last night and 194/107. Patient does endorse a "fullness" in her head, however denies blurry vision, chest pain or SOB. Apt offered for this afternoon, however patient declined, as she felt she was not helped by Chambliss previously. I advised patient to check her BP again today, preferably manually. Patient reports her BP cuff comes tomorrow from [place]. Strict ED precautions given. Will forward to PCP.

## 2020-04-16 ENCOUNTER — Other Ambulatory Visit: Payer: Self-pay

## 2020-04-16 ENCOUNTER — Ambulatory Visit (INDEPENDENT_AMBULATORY_CARE_PROVIDER_SITE_OTHER): Payer: 59 | Admitting: Family Medicine

## 2020-04-16 ENCOUNTER — Encounter: Payer: Self-pay | Admitting: Family Medicine

## 2020-04-16 VITALS — BP 118/82 | HR 71 | Ht 64.0 in | Wt 190.2 lb

## 2020-04-16 DIAGNOSIS — R739 Hyperglycemia, unspecified: Secondary | ICD-10-CM

## 2020-04-16 DIAGNOSIS — I10 Essential (primary) hypertension: Secondary | ICD-10-CM | POA: Diagnosis not present

## 2020-04-16 DIAGNOSIS — Z9189 Other specified personal risk factors, not elsewhere classified: Secondary | ICD-10-CM | POA: Diagnosis not present

## 2020-04-16 DIAGNOSIS — E7800 Pure hypercholesterolemia, unspecified: Secondary | ICD-10-CM

## 2020-04-16 LAB — POCT GLYCOSYLATED HEMOGLOBIN (HGB A1C): Hemoglobin A1C: 6 % — AB (ref 4.0–5.6)

## 2020-04-16 NOTE — Patient Instructions (Signed)
Your blood pressure here and at home look to be in good ranges.  Since you are tolerating the metoprolol, consider continuing to take it.    I would recommend rechecking your cholesterol again in the next 6 to 9 months.  The last cholesterol was not at a range that medication to lower it is recommended.   Your A1c was 6.0% which is the same as it was when you were taking the Metformin.    I am glad to hear you are participating in diet plans that have evidence of being effective.  We can review your progress at your next visit when we check your blood pressure again in 3 months.

## 2020-04-17 ENCOUNTER — Encounter: Payer: Self-pay | Admitting: Family Medicine

## 2020-04-17 NOTE — Assessment & Plan Note (Signed)
Check TSH and palp Thyroid next ov

## 2020-04-17 NOTE — Assessment & Plan Note (Signed)
Current ASCVD risk is 7.3 % No plan for statin as her only accesory risk factor is LDL-C over 160.  Will recheck after 6-8 months of Ms. Nanninga Keto/intermitent fasting diet method.

## 2020-04-17 NOTE — Assessment & Plan Note (Signed)
Lab Results  Component Value Date   HGBA1C 6.0 (A) 04/16/2020  Established problem. Stable. Melanie Cole is enrolled in a diet form that combines Keto and intermittent fasting diets.  She is tolerating the discipline well.   We will recheck her lipids after 6 to 8 months of this lifestyle change to assess its effect.

## 2020-04-17 NOTE — Progress Notes (Signed)
Melanie Cole is alone Sources of clinical information for visit is/are patient and past medical records. Nursing assessment for this office visit was reviewed with the patient for accuracy and revision.     Previous Report(s) Reviewed: historical medical records and lab reports  Depression screen [facility] 2/9 04/16/2020  Decreased Interest 0  Down, Depressed, Hopeless 0  PHQ - 2 Score 0  Altered sleeping 0  Tired, decreased energy 0  Change in appetite 0  Feeling bad or failure about yourself  0  Trouble concentrating 0  Moving slowly or fidgety/restless 0  Suicidal thoughts -  PHQ-9 Score 0  Difficult doing work/chores -    Fall Risk  03/10/2020  Falls in the past year? 0  Number falls in past yr: 0  Injury with Fall? 0    PHQ9 SCORE ONLY 04/16/2020 03/10/2020 05/16/2019  PHQ-9 Total Score 0 1 0    Adult vaccines due  Topic Date Due  . TETANUS/TDAP  12/13/2023    There are no preventive care reminders to display for this patient.    History/P.E. limitations: none  Adult vaccines due  Topic Date Due  . TETANUS/TDAP  12/13/2023   There are no preventive care reminders to display for this patient. There are no preventive care reminders to display for this patient.   Chief Complaint  Patient presents with  . Follow-up    BP

## 2020-04-17 NOTE — Assessment & Plan Note (Signed)
Established problem that has improved.  Continue metoprolol succinate 50 mg daily and HCTZ 25 mg daily regiment.

## 2020-06-04 ENCOUNTER — Other Ambulatory Visit: Payer: Self-pay | Admitting: Family Medicine

## 2020-06-04 DIAGNOSIS — I10 Essential (primary) hypertension: Secondary | ICD-10-CM

## 2020-06-04 MED ORDER — METOPROLOL SUCCINATE ER 50 MG PO TB24: 50.0000 mg | ORAL_TABLET | Freq: Every day | ORAL | 3 refills | Status: DC

## 2020-10-29 ENCOUNTER — Other Ambulatory Visit: Payer: Self-pay | Admitting: Family Medicine

## 2020-10-29 DIAGNOSIS — Z1231 Encounter for screening mammogram for malignant neoplasm of breast: Secondary | ICD-10-CM

## 2020-10-31 ENCOUNTER — Ambulatory Visit
Admission: RE | Admit: 2020-10-31 | Discharge: 2020-10-31 | Disposition: A | Discharge status: Home / Self Care | Payer: 59 | Source: Ambulatory Visit | Attending: Family Medicine | Admitting: Family Medicine

## 2020-10-31 DIAGNOSIS — Z1231 Encounter for screening mammogram for malignant neoplasm of breast: Secondary | ICD-10-CM

## 2021-03-24 DIAGNOSIS — H6981 Other specified disorders of Eustachian tube, right ear: Secondary | ICD-10-CM | POA: Insufficient documentation

## 2021-03-24 DIAGNOSIS — H6991 Unspecified Eustachian tube disorder, right ear: Secondary | ICD-10-CM | POA: Insufficient documentation

## 2021-03-24 HISTORY — DX: Unspecified eustachian tube disorder, right ear: H69.91

## 2021-04-22 ENCOUNTER — Ambulatory Visit: Payer: 59 | Admitting: Family Medicine

## 2021-06-09 ENCOUNTER — Other Ambulatory Visit: Payer: Self-pay | Admitting: Family Medicine

## 2021-08-17 ENCOUNTER — Other Ambulatory Visit: Payer: Self-pay

## 2021-08-17 MED ORDER — HYDROCHLOROTHIAZIDE 25 MG PO TABS: 25.0000 mg | ORAL_TABLET | Freq: Every day | ORAL | 1 refills | Status: DC

## 2021-09-07 ENCOUNTER — Encounter: Payer: Self-pay | Admitting: *Deleted

## 2021-10-14 ENCOUNTER — Ambulatory Visit: Payer: 59 | Admitting: Family Medicine

## 2021-10-19 ENCOUNTER — Other Ambulatory Visit: Payer: Self-pay | Admitting: Family Medicine

## 2021-10-19 DIAGNOSIS — Z1231 Encounter for screening mammogram for malignant neoplasm of breast: Secondary | ICD-10-CM

## 2021-10-28 ENCOUNTER — Encounter: Payer: Self-pay | Admitting: Family Medicine

## 2021-10-28 ENCOUNTER — Other Ambulatory Visit (HOSPITAL_COMMUNITY)
Admission: RE | Admit: 2021-10-28 | Discharge: 2021-10-28 | Disposition: A | Discharge status: Home / Self Care | Payer: 59 | Source: Ambulatory Visit | Attending: Family Medicine | Admitting: Family Medicine

## 2021-10-28 ENCOUNTER — Ambulatory Visit (INDEPENDENT_AMBULATORY_CARE_PROVIDER_SITE_OTHER): Payer: 59 | Admitting: Family Medicine

## 2021-10-28 VITALS — BP 134/60 | HR 82 | Wt 197.0 lb

## 2021-10-28 DIAGNOSIS — Z131 Encounter for screening for diabetes mellitus: Secondary | ICD-10-CM | POA: Diagnosis not present

## 2021-10-28 DIAGNOSIS — I10 Essential (primary) hypertension: Secondary | ICD-10-CM | POA: Diagnosis not present

## 2021-10-28 DIAGNOSIS — E049 Nontoxic goiter, unspecified: Secondary | ICD-10-CM

## 2021-10-28 DIAGNOSIS — Z124 Encounter for screening for malignant neoplasm of cervix: Secondary | ICD-10-CM | POA: Diagnosis present

## 2021-10-28 DIAGNOSIS — M546 Pain in thoracic spine: Secondary | ICD-10-CM

## 2021-10-28 DIAGNOSIS — K5904 Chronic idiopathic constipation: Secondary | ICD-10-CM

## 2021-10-28 DIAGNOSIS — K219 Gastro-esophageal reflux disease without esophagitis: Secondary | ICD-10-CM | POA: Insufficient documentation

## 2021-10-28 DIAGNOSIS — K582 Mixed irritable bowel syndrome: Secondary | ICD-10-CM

## 2021-10-28 DIAGNOSIS — R109 Unspecified abdominal pain: Secondary | ICD-10-CM | POA: Diagnosis not present

## 2021-10-28 DIAGNOSIS — M542 Cervicalgia: Secondary | ICD-10-CM

## 2021-10-28 DIAGNOSIS — N644 Mastodynia: Secondary | ICD-10-CM

## 2021-10-28 DIAGNOSIS — R293 Abnormal posture: Secondary | ICD-10-CM

## 2021-10-28 HISTORY — DX: Chronic idiopathic constipation: K59.04

## 2021-10-28 HISTORY — DX: Gastro-esophageal reflux disease without esophagitis: K21.9

## 2021-10-28 LAB — POCT GLYCOSYLATED HEMOGLOBIN (HGB A1C): Hemoglobin A1C: 6.2 % — AB (ref 4.0–5.6)

## 2021-10-28 MED ORDER — LOPERAMIDE HCL 2 MG PO TABS: 2.0000 mg | ORAL_TABLET | Freq: Four times a day (QID) | ORAL | 0 refills | Status: DC | PRN

## 2021-10-28 MED ORDER — HYDROCHLOROTHIAZIDE 25 MG PO TABS: 25.0000 mg | ORAL_TABLET | Freq: Every day | ORAL | 3 refills | Status: DC

## 2021-10-28 NOTE — Progress Notes (Signed)
ABDOMINAL PAIN  Location: RUQ  Onset: couple months  Radiation: no  Severity: mild Quality: sharp Pattern: intermittent Course: stable  Better with: nothing  Worse with: nothing No change with food, position changes, BM  Symptoms Nausea/Vomiting: no  Diarrhea: yes, 2-3 times a day, often related to meals, watery, no blood, no black stool  Constipation: yes, history of constipation type ibs  Anorexia: no  Fever/Chills: no  Dysuria: no  Rash: no  Wt loss: no  EtOH use: no  NSAIDs/ASA: no  LMP: postmenopausal Vaginal bleeding: no  STD risk/hx: no   Past Surgeries: lap chole Past Abd/pelvic imag2Cing: CT abdomin 2021 unremarkable, abdomin US 1.4 cm GS  Colonoscopy: 2019 - two small reactive polyps - benign

## 2021-10-28 NOTE — Patient Instructions (Addendum)
Please take one Imodium tablet 30 minutes before meals to help reduce diarrheal bowel movements.   I believe your right neck sensation is due to neck posture causing tension in your sternocleidomastoid and other neck muscles.   I believe your abdominal pain in your right upper quadrant is from scar tissue from the Laparoscopy surgical site.  We will check your liver and pancreas with blood tests to make sure they are not the cause.    Your endoscopy last in May 2021 showed chronic gastritis that may cause some discomfort.  A trial of over the counter Prilosec (omeprazole) may be reasonable to see if it help with the abdominal discomfort.    Dr Asiana Benninger would like to see you back in 4 weeks to see how you are doing.

## 2021-10-29 ENCOUNTER — Other Ambulatory Visit: Payer: Self-pay | Admitting: Family Medicine

## 2021-10-29 DIAGNOSIS — N644 Mastodynia: Secondary | ICD-10-CM

## 2021-10-29 LAB — CMP14+EGFR
ALT: 15 IU/L (ref 0–32)
AST: 20 IU/L (ref 0–40)
Albumin/Globulin Ratio: 1.3 (ref 1.2–2.2)
Albumin: 4.7 g/dL (ref 3.8–4.9)
Alkaline Phosphatase: 89 IU/L (ref 44–121)
BUN/Creatinine Ratio: 15 (ref 9–23)
BUN: 12 mg/dL (ref 6–24)
Bilirubin Total: 1.2 mg/dL (ref 0.0–1.2)
CO2: 24 mmol/L (ref 20–29)
Calcium: 10.1 mg/dL (ref 8.7–10.2)
Chloride: 97 mmol/L (ref 96–106)
Creatinine, Ser: 0.78 mg/dL (ref 0.57–1.00)
Globulin, Total: 3.5 g/dL (ref 1.5–4.5)
Glucose: 79 mg/dL (ref 70–99)
Potassium: 4.6 mmol/L (ref 3.5–5.2)
Sodium: 138 mmol/L (ref 134–144)
Total Protein: 8.2 g/dL (ref 6.0–8.5)
eGFR: 90 mL/min/{1.73_m2} (ref >59)

## 2021-10-29 LAB — TSH: TSH: 0.737 u[IU]/mL (ref 0.450–4.500)

## 2021-10-29 LAB — LIPASE: Lipase: 41 U/L (ref 14–72)

## 2021-10-29 LAB — CBC
Hematocrit: 47.8 % — ABNORMAL HIGH (ref 34.0–46.6)
Hemoglobin: 15.7 g/dL (ref 11.1–15.9)
MCH: 29.8 pg (ref 26.6–33.0)
MCHC: 32.8 g/dL (ref 31.5–35.7)
MCV: 91 fL (ref 79–97)
Platelets: 263 10*3/uL (ref 150–450)
RBC: 5.27 x10E6/uL (ref 3.77–5.28)
RDW: 12.8 % (ref 11.7–15.4)
WBC: 6.1 10*3/uL (ref 3.4–10.8)

## 2021-10-30 ENCOUNTER — Encounter: Payer: Self-pay | Admitting: Family Medicine

## 2021-10-30 DIAGNOSIS — M546 Pain in thoracic spine: Secondary | ICD-10-CM

## 2021-10-30 DIAGNOSIS — M542 Cervicalgia: Secondary | ICD-10-CM | POA: Insufficient documentation

## 2021-10-30 DIAGNOSIS — R109 Unspecified abdominal pain: Secondary | ICD-10-CM | POA: Insufficient documentation

## 2021-10-30 DIAGNOSIS — Z124 Encounter for screening for malignant neoplasm of cervix: Secondary | ICD-10-CM | POA: Insufficient documentation

## 2021-10-30 DIAGNOSIS — N644 Mastodynia: Secondary | ICD-10-CM | POA: Insufficient documentation

## 2021-10-30 HISTORY — DX: Pain in thoracic spine: M54.6

## 2021-10-30 NOTE — Assessment & Plan Note (Addendum)
.  New problem abdomin exam: focal area right abdomin at lap incision site ttp, o/w normal  Working diagnosis is incision  Site ttp without evidence of inflammation Other possibilities Dyspepsia (functional, ulcer, reflux), Diarrheal type IBS,   Reassurance and monitoring and trial of otc ppi

## 2021-10-30 NOTE — Assessment & Plan Note (Signed)
Genitalia:  Normal introitus for age, no external lesions, no vaginal discharge, mucosa pink and moist, no vaginal or cervical lesions, no vaginal atrophy, no friaility or hemorrhage, bimanual exam limited by habitus - normal uterus size and position, no adnexal masses or tenderness

## 2021-10-30 NOTE — Assessment & Plan Note (Addendum)
New complaint Right middle breast Intermittent, sharp, no provacation nor relieving symptoms Present for last 6 weeks or so Stable course No radiation May have similar less painful in left medial breast Denies nipple discharge, change in breast shape, color, size  Physical Breast: normal contour, normal skin, no nipple discharge, no palpable masses, no axillary nor [place] lan  A/ Nonspecific focal breast pain, perhaps symmetric P/ Look for evidence of malignancy with diagnostic mammogram OTC APAP or NSAID prn

## 2021-10-30 NOTE — Assessment & Plan Note (Signed)
Established problem history of constipation type ibs. Working expalantion is diarrhea as component of now mixed type ibs. Recommend imodium prior to meals

## 2021-10-30 NOTE — Assessment & Plan Note (Signed)
New complaint Onset: last few weeks Location: between shoulder blades, right side of spine Quality: intermittent sharp Severity: mild Function: works at [employer] as part of work.. Also works as Print production planner.  Pattern: intermittent Course: stable Radiation: no Relief: nothing Precipitant: no trauma, nothing tried to prevent episodes Associated Symptoms:        Change in sensation (dysesthesia/itch or numbness): none No radiation into arms.  Nobladder function changes.  No change in walking  Trauma (Acute or Chronic): none Prior Diagnostic Testing or Treatments: none  Physical exam Thoracic back: TTP right medial scapular border and adjacent muscles bundles.  Reproduces pain with palpation Neck:viscerocranium held with sagittal midline held   to right of torso sagittal midline, TTP along proximal to middle third of right  Sternocleidomastoid muscle  A/ Fibromuscular pain/tender points Posture abnormality P/ Pysical therapy for evaluation and trearment

## 2021-11-02 LAB — CYTOLOGY - PAP
Comment: NEGATIVE
Diagnosis: NEGATIVE
High risk HPV: NEGATIVE

## 2021-11-05 ENCOUNTER — Ambulatory Visit: Payer: 59

## 2021-11-06 ENCOUNTER — Ambulatory Visit
Admission: RE | Admit: 2021-11-06 | Discharge: 2021-11-06 | Disposition: A | Discharge status: Home / Self Care | Payer: 59 | Source: Ambulatory Visit | Attending: Family Medicine | Admitting: Family Medicine

## 2021-11-06 DIAGNOSIS — N644 Mastodynia: Secondary | ICD-10-CM

## 2021-12-02 ENCOUNTER — Ambulatory Visit: Payer: 59 | Admitting: Family Medicine

## 2022-06-24 ENCOUNTER — Encounter: Payer: Self-pay | Admitting: Family Medicine

## 2022-06-24 ENCOUNTER — Other Ambulatory Visit: Payer: Self-pay | Admitting: Family Medicine

## 2022-06-24 ENCOUNTER — Ambulatory Visit (INDEPENDENT_AMBULATORY_CARE_PROVIDER_SITE_OTHER): Payer: 59 | Admitting: Family Medicine

## 2022-06-24 VITALS — BP 132/75 | HR 91 | Ht 64.0 in | Wt 201.0 lb

## 2022-06-24 DIAGNOSIS — N644 Mastodynia: Secondary | ICD-10-CM

## 2022-06-24 NOTE — Patient Instructions (Addendum)
It was great seeing you today!  I have ordered a diagnostic mammogram of your left breast which is scheduled for 4/1 at 10:10am   Feel free to call with any questions or concerns at any time, at [phone].   Take care,  Dr. Shary Key [facility]

## 2022-06-24 NOTE — Progress Notes (Unsigned)
    SUBJECTIVE:   CHIEF COMPLAINT / HPI:   Patient presents for left breast concerns. States left breast started itching about 4 weeks ago, painful when she presses under left arm. Also notes discoloration under left arm. Was seen by urgent care a few days ago and was started at [place] by UC in case there is inflammation given the discoloration.   Of note, had a diagnostic mammogram in August 2023 for focal right breast pain medially and nonfocal breast pain bilaterally.  No suspicious findings were noted. Denies history of breast cancer and no immediate family with history.    PERTINENT  PMH / PSH: Reviewed   OBJECTIVE:   BP 132/75   Pulse 91   Ht 5\' 4"  (1.626 m)   Wt 201 lb (91.2 kg)   LMP 11/16/2012   SpO2 96%   BMI 34.50 kg/m    Physical exam General: well appearing, NAD Cardiovascular: RRR, no murmurs Lungs: CTAB. Normal WOB Abdomen: soft, non-distended, non-tender Skin: warm, dry. L breast with area of hyperpigmentation and hardening under skinof L axilla with diffuse tenderness to palpation. Small approx 1 cm nodule palpated at about 7 o clock position  Chaperone: Leonia Corona [name]  ASSESSMENT/PLAN:   Breast pain, left Patient presents with 4 weeks of breast itching followed by skin changes and tenderness. On exam L breast with area of hyperpigmentation and hardening under skin of L axilla with diffuse tenderness to palpation. Small approx 1 cm nodule palpated at about 7 o clock position. Ordered diagnostic mammogram of L breast. Will follow up pending those results.     [place]

## 2022-06-26 DIAGNOSIS — N644 Mastodynia: Secondary | ICD-10-CM | POA: Insufficient documentation

## 2022-06-26 NOTE — Assessment & Plan Note (Signed)
Patient presents with 4 weeks of breast itching followed by skin changes and tenderness. On exam L breast with area of hyperpigmentation and hardening under skin of L axilla with diffuse tenderness to palpation. Small approx 1 cm nodule palpated at about 7 o clock position. Ordered diagnostic mammogram of L breast. Will follow up pending those results.

## 2022-07-04 ENCOUNTER — Ambulatory Visit
Admission: RE | Admit: 2022-07-04 | Discharge: 2022-07-04 | Disposition: A | Discharge status: Home / Self Care | Payer: 59 | Source: Ambulatory Visit | Attending: Family Medicine | Admitting: Family Medicine

## 2022-07-04 ENCOUNTER — Other Ambulatory Visit: Payer: 59

## 2022-07-04 DIAGNOSIS — N644 Mastodynia: Secondary | ICD-10-CM

## 2022-07-28 ENCOUNTER — Ambulatory Visit: Payer: 59 | Admitting: Family Medicine

## 2022-09-28 ENCOUNTER — Ambulatory Visit (INDEPENDENT_AMBULATORY_CARE_PROVIDER_SITE_OTHER): Payer: 59 | Admitting: Family Medicine

## 2022-09-28 ENCOUNTER — Encounter: Payer: Self-pay | Admitting: Family Medicine

## 2022-09-28 VITALS — BP 138/82 | HR 90 | Wt 204.0 lb

## 2022-09-28 DIAGNOSIS — Z131 Encounter for screening for diabetes mellitus: Secondary | ICD-10-CM

## 2022-09-28 DIAGNOSIS — R1013 Epigastric pain: Secondary | ICD-10-CM

## 2022-09-28 DIAGNOSIS — Z9189 Other specified personal risk factors, not elsewhere classified: Secondary | ICD-10-CM

## 2022-09-28 DIAGNOSIS — E7800 Pure hypercholesterolemia, unspecified: Secondary | ICD-10-CM

## 2022-09-28 DIAGNOSIS — Z79899 Other long term (current) drug therapy: Secondary | ICD-10-CM | POA: Diagnosis not present

## 2022-09-28 DIAGNOSIS — I10 Essential (primary) hypertension: Secondary | ICD-10-CM

## 2022-09-28 DIAGNOSIS — K580 Irritable bowel syndrome with diarrhea: Secondary | ICD-10-CM

## 2022-09-28 DIAGNOSIS — R0683 Snoring: Secondary | ICD-10-CM

## 2022-09-28 NOTE — Patient Instructions (Signed)
Blood pressure looks good.  Keep taking your hydrochlorothiazide  Checking Comprehensive Metabolic Panel, CBC, Lipase, and Cholesterol and A1c.   Lets plan on repeating your Pap smear in July 2026.   Try taking Imodium A-D one tablet about 30 minutes before meals to help prevent having the need to go to the bathroom immediately with eating.      SHINGLES VACCINATION  Chickenpox and shingles are related because they are caused by the same virus (varicella-zoster virus). After a person recovers from chickenpox, the virus stays dormant (inactive) in the body. It can reactivate years later and cause shingles.  CDC recommends that adults 50 years and older get two doses of the shingles vaccine called Shingrix (recombinant zoster vaccine) to prevent shingles and the complications from the disease.  Shingrix vaccination provides strong protection against shingles. In adults 50 years Shingrix is more than 90% effective at preventing shingles.  Studies show that Shingrix is safe. The vaccine helps your body create a strong defense against shingles. As a result, you are likely to have temporary side effects from getting the shots. Some people feel they are having a mild flu without the head cold symptoms.  The side effects might affect your ability to do normal daily activities for 2 to 3 days.  Getting the shot when you have a couple days without commitments is a good idea, in case you do feel under the weather.  Medicare prescription drug plans (Part D) usually cover all available vaccines needed to prevent illness, like the shingles (Shingrix) shot. Contact your Medicare drug plan If you are uncertain if they will cover the shingles vaccination.   Your pharmacist can give you Shingrix as a shot in your upper arm. You will need two Shingrix vaccinations to complete your inoculation against shingles.  The second injection is given at least 2 months after the first Shingrix vaccination was given.

## 2022-09-28 NOTE — Progress Notes (Unsigned)
Melanie Cole is alone Sources of clinical information for visit is/are patient. Nursing assessment for this office visit was reviewed with the patient for accuracy and revision.     Previous Report(s) Reviewed: historical medical records     09/28/2022    2:52 PM  Depression screen PHQ 2/9  Decreased Interest 0  Down, Depressed, Hopeless 0  PHQ - 2 Score 0  Altered sleeping 2  Tired, decreased energy 2  Change in appetite 0  Feeling bad or failure about yourself  0  Trouble concentrating 0  Moving slowly or fidgety/restless 0  Suicidal thoughts 0  PHQ-9 Score 4   Flowsheet Row Office Visit from 09/28/2022 in [facility] Office Visit from 10/28/2021 in [facility] Office Visit from 04/16/2020 in [facility]  Thoughts that you would be better off dead, or of hurting yourself in some way Not at all Not at all --  PHQ-9 Total Score 4 0 0          03/10/2020   11:30 AM  Fall Risk   Falls in the past year? 0  Number falls in past yr: 0  Injury with Fall? 0       09/28/2022    2:52 PM 10/28/2021    3:44 PM 04/16/2020    4:10 PM  PHQ9 SCORE ONLY  PHQ-9 Total Score 4 0 0    There are no preventive care reminders to display for this patient.  Health Maintenance Due  Topic Date Due   Zoster Vaccines- Shingrix (1 of 2) Never done   COVID-19 Vaccine (3 - Moderna risk series) 01/23/2020      History/P.E. limitations: none  There are no preventive care reminders to display for this patient. There are no preventive care reminders to display for this patient.  Health Maintenance Due  Topic Date Due   Zoster Vaccines- Shingrix (1 of 2) Never done   COVID-19 Vaccine (3 - Moderna risk series) 01/23/2020     Chief Complaint  Patient presents with   Abdominal Pain      --------------------------------------------------------------------------------------------------------------------------------------------- Visit Problem List with A/P  Abdominal pain Recurrent established problem Onset 03/2019 Average occurrence about every other day  Sharp pain in upper mid-abdomen. Sporadic pain Lasts about 10 minutes.  No relation to eating. No loss of appetite.  Usual frequent BMs - no change.  No melena No indigestion Lap chole 2021 for chronic cholecystitis EGD Dr Adela Lank 08/05/19 showed 1 cm H/H, normal esophagus, benign polyp, negative H. Pylori biopsy, normal duodenum 1&2nd portions.   Office visit with me on last summer with similar abdominal pain complaints including functional dyspepsia unresponsive to PPI.   Abdominal exam is unremarkable except for some mild tenderness to palp epigastrum.  A/ Nonspecific, chronic epigastric pain, not disabling. Question of functional dyspepsia (epigastric pain syndrome) given epigastric pain with no evidence of structural disease on EGD to explain symptoms.  Patient with history of IBS diarrhea predominant.  Poor response to PPI in past.  I am hesistant to recommend chronic TCA medication for Melanie Cole for a sporadic discomfort.   This would be an option should the pain become more difficult for Melanie Cole P/ Check Comprehensive Metabolic Panel, Lipase, and CBC     Essential hypertension, benign Established problem Well Controlled. Patient is at goal of <140/90. No signs of complications, medication side effects, or red flags. Comprehensive Metabolic Panel:    Component Value Date/Time  NA 138 10/28/2021 1641   K 4.6 10/28/2021 1641   CL 97 10/28/2021 1641   CO2 24 10/28/2021 1641   BUN 12 10/28/2021 1641   CREATININE 0.78 10/28/2021 1641   CREATININE 0.60 12/10/2015 0951   GLUCOSE 79 10/28/2021 1641   GLUCOSE 98 10/30/2019 1441   CALCIUM 10.1 10/28/2021 1641   AST 20 10/28/2021 1641   ALT 15  10/28/2021 1641   ALKPHOS 89 10/28/2021 1641   BILITOT 1.2 10/28/2021 1641   PROT 8.2 10/28/2021 1641   ALBUMIN 4.7 10/28/2021 1641    Continue current medications and other regiments.   Pure hypercholesterolemia Established problem Lipid Panel     Component Value Date/Time   CHOL 257 (H) 05/16/2019 1744   TRIG 124 05/16/2019 1744   HDL 59 05/16/2019 1744   CHOLHDL 4.4 05/16/2019 1744   CHOLHDL 4.8 10/27/2017 1020   VLDL 15 10/27/2017 1020   LDLCALC 176 (H) 05/16/2019 1744   LDLDIRECT 190 (H) 04/19/2018 1649   LABVLDL 22 05/16/2019 1744    Lipid panel pending  At risk for diabetes mellitus Lab Results  Component Value Date   HGBA1C 6.2 (A) 10/28/2021   A1c pending  Thyroid enlargement Check TSH and palp Thyroid next ov    Irritable bowel syndrome with diarrhea Established problem Uncontrolled.  Melanie Cole is having a strong gastrocolic relfex with eating.  Recommend Imodium AD one tab 30 min before meals.        09/28/2022    2:52 PM  Depression screen PHQ 2/9  Decreased Interest 0  Down, Depressed, Hopeless 0  PHQ - 2 Score 0  Altered sleeping 2  Tired, decreased energy 2  Change in appetite 0  Feeling bad or failure about yourself  0  Trouble concentrating 0  Moving slowly or fidgety/restless 0  Suicidal thoughts 0  PHQ-9 Score 4   Flowsheet Row Office Visit from 09/28/2022 in [facility] Office Visit from 10/28/2021 in [facility] Office Visit from 04/16/2020 in [facility]  Thoughts that you would be better off dead, or of hurting yourself in some way Not at all Not at all --  PHQ-9 Total Score 4 0 0          03/10/2020   11:30 AM  Fall Risk   Falls in the past year? 0  Number falls in past yr: 0  Injury with Fall? 0       09/28/2022    2:52 PM 10/28/2021    3:44 PM 04/16/2020    4:10 PM  PHQ9 SCORE ONLY  PHQ-9 Total Score 4 0 0    There are no preventive care reminders to  display for this patient.  Health Maintenance Due  Topic Date Due   Zoster Vaccines- Shingrix (1 of 2) Never done   COVID-19 Vaccine (3 - Moderna risk series) 01/23/2020     --------------------------------------------------------------------------------------------------------------------------------------------- Visit Problem List with A/P  Abdominal pain Recurrent established problem Onset 03/2019 Average occurrence about every other day  Sharp pain in upper mid-abdomen. Sporadic pain Lasts about 10 minutes.  No relation to eating. No loss of appetite.  Usual frequent BMs - no change.  No melena No indigestion Lap chole 2021 for chronic cholecystitis EGD Dr Adela Lank 08/05/19 showed 1 cm H/H, normal esophagus, benign polyp, negative H. Pylori biopsy, normal duodenum 1&2nd portions.   Office visit with me on last summer with similar abdominal pain complaints including functional dyspepsia unresponsive  to PPI.   Abdominal exam is unremarkable except for some mild tenderness to palp epigastrum.  A/ Nonspecific, chronic epigastric pain, not disabling. Question of functional dyspepsia (epigastric pain syndrome) given epigastric pain with no evidence of structural disease on EGD to explain symptoms.  Patient with history of IBS diarrhea predominant.  Poor response to PPI in past.  I am hesistant to recommend chronic TCA medication for Melanie Reichow for a sporadic discomfort.   This would be an option should the pain become more difficult for Melanie Zagami P/ Check Comprehensive Metabolic Panel, Lipase, and CBC     Essential hypertension, benign Established problem Well Controlled. Patient is at goal of <140/90. No signs of complications, medication side effects, or red flags. Comprehensive Metabolic Panel:    Component Value Date/Time   NA 138 10/28/2021 1641   K 4.6 10/28/2021 1641   CL 97 10/28/2021 1641   CO2 24 10/28/2021 1641   BUN 12 10/28/2021 1641   CREATININE 0.78  10/28/2021 1641   CREATININE 0.60 12/10/2015 0951   GLUCOSE 79 10/28/2021 1641   GLUCOSE 98 10/30/2019 1441   CALCIUM 10.1 10/28/2021 1641   AST 20 10/28/2021 1641   ALT 15 10/28/2021 1641   ALKPHOS 89 10/28/2021 1641   BILITOT 1.2 10/28/2021 1641   PROT 8.2 10/28/2021 1641   ALBUMIN 4.7 10/28/2021 1641    Continue current medications and other regiments.   Pure hypercholesterolemia Established problem Lipid Panel     Component Value Date/Time   CHOL 257 (H) 05/16/2019 1744   TRIG 124 05/16/2019 1744   HDL 59 05/16/2019 1744   CHOLHDL 4.4 05/16/2019 1744   CHOLHDL 4.8 10/27/2017 1020   VLDL 15 10/27/2017 1020   LDLCALC 176 (H) 05/16/2019 1744   LDLDIRECT 190 (H) 04/19/2018 1649   LABVLDL 22 05/16/2019 1744    Lipid panel pending  At risk for diabetes mellitus Lab Results  Component Value Date   HGBA1C 6.2 (A) 10/28/2021   A1c pending  Thyroid enlargement Check TSH and palp Thyroid next ov    Irritable bowel syndrome with diarrhea Established problem Uncontrolled.  Melanie Gayler is having a strong gastrocolic relfex with eating.  Recommend Imodium AD one tab 30 min before meals.

## 2022-09-28 NOTE — Assessment & Plan Note (Signed)
Recurrent established problem Onset 03/2019 Average occurrence about every other day  Sharp pain in upper mid-abdomen. Sporadic pain Lasts about 10 minutes.  No relation to eating. No loss of appetite.  Usual frequent BMs - no change.  No melena No indigestion Lap chole 2021 for chronic cholecystitis EGD Dr Adela Lank 08/05/19 showed 1 cm H/H, normal esophagus, benign polyp, negative H. Pylori biopsy, normal duodenum 1&2nd portions.   Office visit with me on last summer with similar abdominal pain complaints including functional dyspepsia unresponsive to PPI.   Abdominal exam is unremarkable except for some mild tenderness to palp epigastrum.  A/ Nonspecific, chronic epigastric pain, not disabling. Question of functional dyspepsia (epigastric pain syndrome) given epigastric pain with no evidence of structural disease on EGD to explain symptoms.  Patient with history of IBS diarrhea predominant.  Poor response to PPI in past.  I am hesistant to recommend chronic TCA medication for Melanie Cole for a sporadic discomfort.   This would be an option should the pain become more difficult for Melanie Cole P/ Check Comprehensive Metabolic Panel, Lipase, and CBC

## 2022-09-29 ENCOUNTER — Encounter: Payer: Self-pay | Admitting: Family Medicine

## 2022-09-29 DIAGNOSIS — R0683 Snoring: Secondary | ICD-10-CM | POA: Insufficient documentation

## 2022-09-29 LAB — CMP14+EGFR
ALT: 23 IU/L (ref 0–32)
AST: 18 IU/L (ref 0–40)
Albumin: 4.2 g/dL (ref 3.8–4.9)
Alkaline Phosphatase: 76 IU/L (ref 44–121)
BUN/Creatinine Ratio: 23 (ref 9–23)
BUN: 17 mg/dL (ref 6–24)
Bilirubin Total: 0.5 mg/dL (ref 0.0–1.2)
CO2: 24 mmol/L (ref 20–29)
Calcium: 10 mg/dL (ref 8.7–10.2)
Chloride: 99 mmol/L (ref 96–106)
Creatinine, Ser: 0.74 mg/dL (ref 0.57–1.00)
Globulin, Total: 3.1 g/dL (ref 1.5–4.5)
Glucose: 89 mg/dL (ref 70–99)
Potassium: 3.9 mmol/L (ref 3.5–5.2)
Sodium: 138 mmol/L (ref 134–144)
Total Protein: 7.3 g/dL (ref 6.0–8.5)
eGFR: 95 mL/min/{1.73_m2} (ref >59)

## 2022-09-29 LAB — LIPID PANEL
Chol/HDL Ratio: 3.7 ratio (ref 0.0–4.4)
Cholesterol, Total: 260 mg/dL — ABNORMAL HIGH (ref 100–199)
HDL: 70 mg/dL (ref >39)
LDL Chol Calc (NIH): 170 mg/dL — ABNORMAL HIGH (ref 0–99)
Triglycerides: 112 mg/dL (ref 0–149)
VLDL Cholesterol Cal: 20 mg/dL (ref 5–40)

## 2022-09-29 LAB — HEMOGLOBIN A1C
Est. average glucose Bld gHb Est-mCnc: 160 mg/dL
Hgb A1c MFr Bld: 7.2 % — ABNORMAL HIGH (ref 4.8–5.6)

## 2022-09-29 LAB — CBC
Hematocrit: 42.4 % (ref 34.0–46.6)
Hemoglobin: 14.4 g/dL (ref 11.1–15.9)
MCH: 30.8 pg (ref 26.6–33.0)
MCHC: 34 g/dL (ref 31.5–35.7)
MCV: 91 fL (ref 79–97)
Platelets: 324 10*3/uL (ref 150–450)
RBC: 4.68 x10E6/uL (ref 3.77–5.28)
RDW: 12.5 % (ref 11.7–15.4)
WBC: 7.5 10*3/uL (ref 3.4–10.8)

## 2022-09-29 LAB — LIPASE: Lipase: 57 U/L (ref 14–72)

## 2022-09-29 NOTE — Assessment & Plan Note (Signed)
New complaint (+) daytime somnolence and short sleep latency (+) loud snoring per husband (+) Hypertension BMI > 30% Age > 50 Neck circ > 40 cm  A/ At risk of sleep apnea P/ Referral to Sleep Medicine - Dr Vickey Huger.

## 2022-09-29 NOTE — Assessment & Plan Note (Addendum)
Established problem Well Controlled. Patient is at goal of <140/90. No signs of complications, medication side effects, or red flags. Comprehensive Metabolic Panel:    Component Value Date/Time   NA 138 10/28/2021 1641   K 4.6 10/28/2021 1641   CL 97 10/28/2021 1641   CO2 24 10/28/2021 1641   BUN 12 10/28/2021 1641   CREATININE 0.78 10/28/2021 1641   CREATININE 0.60 12/10/2015 0951   GLUCOSE 79 10/28/2021 1641   GLUCOSE 98 10/30/2019 1441   CALCIUM 10.1 10/28/2021 1641   AST 20 10/28/2021 1641   ALT 15 10/28/2021 1641   ALKPHOS 89 10/28/2021 1641   BILITOT 1.2 10/28/2021 1641   PROT 8.2 10/28/2021 1641   ALBUMIN 4.7 10/28/2021 1641    Continue current medications and other regiments.

## 2022-09-29 NOTE — Assessment & Plan Note (Signed)
Established problem Lipid Panel     Component Value Date/Time   CHOL 257 (H) 05/16/2019 1744   TRIG 124 05/16/2019 1744   HDL 59 05/16/2019 1744   CHOLHDL 4.4 05/16/2019 1744   CHOLHDL 4.8 10/27/2017 1020   VLDL 15 10/27/2017 1020   LDLCALC 176 (H) 05/16/2019 1744   LDLDIRECT 190 (H) 04/19/2018 1649   LABVLDL 22 05/16/2019 1744    Lipid panel pending

## 2022-09-29 NOTE — Assessment & Plan Note (Signed)
Check TSH and palp Thyroid next ov 

## 2022-09-29 NOTE — Assessment & Plan Note (Signed)
Established problem Uncontrolled.  Melanie Cole is having a strong gastrocolic relfex with eating.  Recommend Imodium AD one tab 30 min before meals.

## 2022-09-29 NOTE — Assessment & Plan Note (Signed)
Lab Results  Component Value Date   HGBA1C 6.2 (A) 10/28/2021   A1c pending

## 2022-09-29 NOTE — Addendum Note (Signed)
Addended by: Acquanetta Belling D on: 09/29/2022 07:46 AM   Modules accepted: Orders

## 2022-10-03 ENCOUNTER — Other Ambulatory Visit: Payer: 59

## 2022-10-03 ENCOUNTER — Telehealth: Payer: Self-pay | Admitting: Family Medicine

## 2022-10-03 DIAGNOSIS — E119 Type 2 diabetes mellitus without complications: Secondary | ICD-10-CM

## 2022-10-03 DIAGNOSIS — R0683 Snoring: Secondary | ICD-10-CM

## 2022-10-03 DIAGNOSIS — R739 Hyperglycemia, unspecified: Secondary | ICD-10-CM

## 2022-10-03 DIAGNOSIS — E7800 Pure hypercholesterolemia, unspecified: Secondary | ICD-10-CM

## 2022-10-03 DIAGNOSIS — E661 Drug-induced obesity: Secondary | ICD-10-CM

## 2022-10-03 MED ORDER — METFORMIN HCL ER 500 MG PO TB24
ORAL_TABLET | ORAL | 0 refills | Status: DC
Start: 2022-10-03 — End: 2023-06-07

## 2022-10-03 MED ORDER — ATORVASTATIN CALCIUM 20 MG PO TABS: 20.0000 mg | ORAL_TABLET | Freq: Every day | ORAL | 0 refills | Status: DC

## 2022-10-03 MED ORDER — MOUNJARO 2.5 MG/0.5ML ~~LOC~~ SOAJ: 2.5000 mg | SUBCUTANEOUS | 0 refills | Status: DC

## 2022-10-03 NOTE — Telephone Encounter (Signed)
I discussed Melanie Cole' elevated A1c result and its likely implication of her having diabetes mellitus type 2.    Plan Repeat A1c this week to confirm diagnosis per ADA guideline Referral for MNT Prescription metformin 500 mg XL tab, tab one qam x 7 day then two tab qam\ Prescription atorvastatin 20 mg daily, titrate up if tolerated.  Prescription Mounjaro 2.5 mg qwk  Will contact with repeat A1c then discuss follow up.

## 2022-10-04 LAB — HEMOGLOBIN A1C
Est. average glucose Bld gHb Est-mCnc: 151 mg/dL
Hgb A1c MFr Bld: 6.9 % — ABNORMAL HIGH (ref 4.8–5.6)

## 2022-10-07 ENCOUNTER — Telehealth: Payer: Self-pay

## 2022-10-07 NOTE — Telephone Encounter (Signed)
A Prior Authorization was initiated for this patients MOUNJARO through CoverMyMeds.   Key: [id number]

## 2022-10-10 NOTE — Telephone Encounter (Signed)
Prior Auth for patients medication MOUNJARO approved by [employer] from 10/07/22 to 10/07/23.  CoverMyMeds Key: [facility] Case ID #: [id number]

## 2022-11-07 ENCOUNTER — Other Ambulatory Visit: Payer: Self-pay | Admitting: Family Medicine

## 2022-11-07 DIAGNOSIS — I10 Essential (primary) hypertension: Secondary | ICD-10-CM

## 2022-11-08 ENCOUNTER — Other Ambulatory Visit: Payer: Self-pay | Admitting: Family Medicine

## 2022-11-08 DIAGNOSIS — R739 Hyperglycemia, unspecified: Secondary | ICD-10-CM

## 2022-11-08 DIAGNOSIS — E119 Type 2 diabetes mellitus without complications: Secondary | ICD-10-CM

## 2022-11-09 ENCOUNTER — Other Ambulatory Visit: Payer: Self-pay | Admitting: Family Medicine

## 2022-11-09 DIAGNOSIS — E119 Type 2 diabetes mellitus without complications: Secondary | ICD-10-CM

## 2022-11-09 MED ORDER — MOUNJARO 5 MG/0.5ML ~~LOC~~ SOAJ
5.0000 mg | SUBCUTANEOUS | 0 refills | Status: DC
Start: 2022-11-09 — End: 2022-12-01

## 2022-11-10 ENCOUNTER — Ambulatory Visit (INDEPENDENT_AMBULATORY_CARE_PROVIDER_SITE_OTHER): Payer: 59 | Admitting: Family Medicine

## 2022-11-10 ENCOUNTER — Encounter: Payer: Self-pay | Admitting: Family Medicine

## 2022-11-10 ENCOUNTER — Telehealth: Payer: Self-pay

## 2022-11-10 VITALS — BP 129/89 | HR 81 | Ht 64.0 in | Wt 199.6 lb

## 2022-11-10 DIAGNOSIS — Z79899 Other long term (current) drug therapy: Secondary | ICD-10-CM | POA: Diagnosis not present

## 2022-11-10 DIAGNOSIS — M79674 Pain in right toe(s): Secondary | ICD-10-CM | POA: Diagnosis not present

## 2022-11-10 DIAGNOSIS — R0683 Snoring: Secondary | ICD-10-CM

## 2022-11-10 DIAGNOSIS — I10 Essential (primary) hypertension: Secondary | ICD-10-CM | POA: Diagnosis not present

## 2022-11-10 DIAGNOSIS — E7800 Pure hypercholesterolemia, unspecified: Secondary | ICD-10-CM

## 2022-11-10 NOTE — Patient Instructions (Addendum)
We are checking your Uric Acid.   Dr Laquasha Groome will let you know it they are abnormal.  If they are normal, he will send your a message through your MyChart  Review your urgent care notes and report.   Increase your Mounjaro to 5 mg injection weekly.  If you tolerate this does for 4 weeks, then we will increase to 7.5 mg injectins weekly.        SHINGLES VACCINATION  Chickenpox and shingles are related because they are caused by the same virus (varicella-zoster virus). After a person recovers from chickenpox, the virus stays dormant (inactive) in the body. It can reactivate years later and cause shingles.  CDC recommends that adults 50 years and older get two doses of the shingles vaccine called Shingrix (recombinant zoster vaccine) to prevent shingles and the complications from the disease.  Shingrix vaccination provides strong protection against shingles. In adults 50 years Shingrix is more than 90% effective at preventing shingles.  Studies show that Shingrix is safe. The vaccine helps your body create a strong defense against shingles. As a result, you are likely to have temporary side effects from getting the shots. Some people feel they are having a mild flu without the head cold symptoms.  The side effects might affect your ability to do normal daily activities for 2 to 3 days.  Getting the shot when you have a couple days without commitments is a good idea, in case you do feel under the weather.  Medicare prescription drug plans (Part D) usually cover all available vaccines needed to prevent illness, like the shingles (Shingrix) shot. Contact your Medicare drug plan If you are uncertain if they will cover the shingles vaccination.   Your pharmacist can give you Shingrix as a shot in your upper arm. You will need two Shingrix vaccinations to complete your inoculation against shingles.  The second injection is given at least 2 months after the first Shingrix vaccination was given.

## 2022-11-10 NOTE — Progress Notes (Signed)
    SUBJECTIVE:   CHIEF COMPLAINT / HPI:   Melanie Cole is a 57 y/o female presenting for TII DM and hypercholesterolemia follow up and recent R great toe pain.  TII DM: Patient is taking Metformin 500 mg BID and Mounjaro 2.5 mg injection weekly. She denies any side effects other than increased nausea. Patient reports about 5 pound weight loss.  Hypercholesterolemia: Patient is currently taking atorvastatin 20 mg daily. She reports missing about 4-5 doses per week due to forgetting to take this medicine before bed (she reports taking her other medicines in the morning).  R Great Toe Pain: Patient reports on 7/17, she experienced pain in her R great toe during the middle of the night. She reports the pain went away until the next night where she experienced excruciating pain. She reports the following Tuesday continuing to experience severe pain, so the follow day, she went to an urgent care. She said her x-ray showed a spur and no fractures. She reports she was prescribed Keflex for 7 days and prednisone for 5 days; she reports it was thought she might have arthritis, cellulitis or gout. She reports a radiologist read her scans and reported arthritis. She reports some relief with prednisone, but she continued to have pain until this past Monday. She reports her pain was sharp, stabbing and burning and that her toe was red, warm and tender to touch. Patient denies hx of trauma, cuts or gout.  PERTINENT  PMH / PSH: HTN, IBS  OBJECTIVE:   BP 129/89   Pulse 81   Ht 5\' 4"  (1.626 m)   Wt 199 lb 9.6 oz (90.5 kg)   LMP 11/16/2012   SpO2 98%   BMI 34.26 kg/m    General: NAD, pleasant, cooperative HEENT: Normocephalic, atraumatic Cardiovascular: RRR, no murmurs Respiratory: Clear to auscultation bilaterally, regular work of breathing Extremities: 2+ dp equal bilaterally  MSK: Slight edema on R dorsal foot; no tenderness to palpation, erythema or swelling of R great toe; slight pain with  extension of R great toe  ASSESSMENT/PLAN:   Type 2 diabetes mellitus without complications (HCC) Patient appears to be tolerating medications well. Increasing Mounjaro to 5 mg weekly recommended. Patient encouraged to continue current Metformin dose.   Snoring Patient to follow up with sleep study as scheduled.   Pure hypercholesterolemia Patient encouraged to take atorvastatin in the AM with her other medications.   R Great Toe Pain Given diagnosis of TII DM and symptom presentation, suspect gout flare. According to patient, uric acid levels were not assess at urgent care. Uric acid levels ordered in office today. Patient's chart to be requested from urgent care to review notes and x-ray.   FOLLOW-UP: Patient to follow up in 2 months for A1c follow up.   Bubba Hales, Medical Student [facility]

## 2022-11-10 NOTE — Assessment & Plan Note (Signed)
Patient to follow up with sleep study as scheduled.

## 2022-11-10 NOTE — Assessment & Plan Note (Addendum)
Patient appears to be tolerating medications well. Increasing Mounjaro to 5 mg weekly recommended. Patient encouraged to continue current Metformin dose.

## 2022-11-10 NOTE — Telephone Encounter (Signed)
-----   Message from [facility] McDiarmid sent at 11/09/2022 12:36 PM EDT ----- Please let Ms Khosravi know that the next higher dose of her Mounjaro, 5 mg injected weekly, was sent to her pharmacy.    She will need to be seen within the next month for follow up with McDiarmid either in the office or by video visit before any further refills of this medication can be prescribed.  Thank you,  Tawanna Cooler

## 2022-11-10 NOTE — Telephone Encounter (Signed)
Spoke with patient. Informed her of the note left by Dr. McDiarmid. Patient understood. Aquilla Solian, [name]

## 2022-11-10 NOTE — Assessment & Plan Note (Addendum)
Patient encouraged to take atorvastatin in the AM with her other medications.

## 2022-11-11 ENCOUNTER — Encounter: Payer: Self-pay | Admitting: Family Medicine

## 2022-11-11 DIAGNOSIS — E790 Hyperuricemia without signs of inflammatory arthritis and tophaceous disease: Secondary | ICD-10-CM | POA: Insufficient documentation

## 2022-11-11 HISTORY — DX: Hyperuricemia without signs of inflammatory arthritis and tophaceous disease: E79.0

## 2022-11-11 NOTE — Progress Notes (Signed)
See attestation

## 2022-11-16 ENCOUNTER — Institutional Professional Consult (permissible substitution): Payer: 59 | Admitting: Neurology

## 2022-11-17 ENCOUNTER — Encounter: Payer: Self-pay | Admitting: Family Medicine

## 2022-11-17 ENCOUNTER — Telehealth: Payer: Self-pay

## 2022-11-17 DIAGNOSIS — M109 Gout, unspecified: Secondary | ICD-10-CM

## 2022-11-17 MED ORDER — INDOMETHACIN 50 MG PO CAPS: ORAL_CAPSULE | ORAL | 2 refills | Status: DC

## 2022-11-17 NOTE — Telephone Encounter (Signed)
Patient calls nurse line regarding concerns with R foot swelling and pain. "Bruised look on side of foot"   Burning, stabbing pain in R great toe. Similar to pain last week, however, has worsened. Tuesday night noticed worsening.   Denies recent injury to foot.   She reports being advised to contact office if symptoms returned.   Will forward to Dr. McDiarmid for further advisement.   Veronda Prude, RN

## 2022-11-17 NOTE — Telephone Encounter (Signed)
Onset: 2 days ago Location: right great toe Quality: sharp Severity: severe Function: interfering with sleep.  Linen touch toe causing pain.  Pattern: recurrent similar pain and location about a month ago.  Course: progressive Relief: nothing tried Precipitant: no trauma Prior Diagnostic Testing or Treatments: Uric Acid 11/09/22 elevated to 7.6 mg/dL Relevant PMH/PSH: presumed acute gouty right foot podagra about a month ago. Tx'd with corticosteroids and antibiotics at [facility] in [place].      A/ Working diagnosis if recurrent, acute gouty podagra  P/ Prescription indomethacin 50 mg three times a day for 3 days beyond  resolution of pain Medication dispensed so patient may self-initiate indomethacin course.   Ice, elevation. Stop hydrochlorothiazide RTC 4 weeks to assess gout and Hypertension Call if not improved within three days or if worsens.

## 2022-11-17 NOTE — Telephone Encounter (Signed)
Scheduled patient for 08/29 because patient stated that she could not do any morning appointments at all.

## 2022-11-23 ENCOUNTER — Encounter: Payer: Self-pay | Admitting: Neurology

## 2022-11-23 ENCOUNTER — Ambulatory Visit (INDEPENDENT_AMBULATORY_CARE_PROVIDER_SITE_OTHER): Payer: 59 | Admitting: Neurology

## 2022-11-23 VITALS — BP 147/92 | HR 81 | Ht 63.0 in | Wt 198.0 lb

## 2022-11-23 DIAGNOSIS — R0683 Snoring: Secondary | ICD-10-CM

## 2022-11-23 DIAGNOSIS — G4719 Other hypersomnia: Secondary | ICD-10-CM | POA: Insufficient documentation

## 2022-11-23 DIAGNOSIS — F518 Other sleep disorders not due to a substance or known physiological condition: Secondary | ICD-10-CM | POA: Insufficient documentation

## 2022-11-23 DIAGNOSIS — G4753 Recurrent isolated sleep paralysis: Secondary | ICD-10-CM | POA: Diagnosis not present

## 2022-11-23 NOTE — Progress Notes (Signed)
SLEEP MEDICINE CLINIC    Provider:  Melvyn Novas, MD  Primary Care Physician:  Cole, Melanie Roach, MD [address] [place] [address]     Referring Provider: Etta Grandchild, Md [address] [place],  [place] [address]          Chief Complaint according to patient   Patient presents with:     New SLEEP Patient (Initial Visit)           HISTORY OF PRESENT ILLNESS:  Melanie Cole is a 57 y.o. female patient who is seen upon referral on 11/23/2022 from PCP for a sleep consultation with attention to hypersomnia.  Chief concern according to patient :  " I have sometimes an irresistible urge to sleep, I have always been sleepier than my peers, even in childhood.  I always fell asleep on the way to church 40 minutes from home, and today I feel sleepy and like in trance". I used to have vivid dreams but not as frequent recently." She reports Hypnopompic hallucinations, and sleep paralysis- a very scary sensation.    Snoring began a couple of years ago, and most recently she went to [facility] with 2 cousins  and those reported she snored loudly, they couldn't get rest.  Her husband is witnessing her snoring, but he will go to another room to sleep and feels not bothered.      I have the pleasure of seeing Melanie Cole , RN, on 11/23/22 . She is a right -handed AA female [employer] employee with a possible sleep disorder.    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  No pets. Tobacco use; none .  ETOH use ; none , Caffeine intake in form of Coffee( /) Soda( /) Tea ( /) or energy drinks Exercise :inconsistent .      Sleep habits are as  follows: The patient's dinner time is between 5-7 PM. The patient goes to bed at 7-9 PM and continues to sleep for 7 hours, wakes for 3 bathroom breaks.  She wakes rarely up with headaches.  The preferred sleep position is variable, restless, with the support of 1 pillow. Dreams are reportedly less frequent/ still very vivid.   The patient wakes up with an alarm- 4.40   AM is the usual rise time. She reports initially  feeling refreshed or restored in AM, with symptoms such as dry mouth. She gets sleepier as the day goes on-  Naps are taken frequently, lasting from 10. to 15 minutes and are refreshing for several hours.     Review of Systems: Out of a complete 14 system review, the patient complains of only the following symptoms, and all other reviewed systems are negative.:  Fatigue, sleepiness , snoring, hypersomnia, fragmented sleep,   Dreams in " installments". VIVID .   If I think about sleep I will fall asleep.   Nocturia 2-3 times at night.    How likely are you to doze in the following situations: 0 = not likely, 1 = slight chance, 2 = moderate chance, 3 = high chance   Sitting and  Reading? Watching Television? Sitting inactive in a public place (theater or meeting)? As a passenger in a car for an hour without a break? Lying down in the afternoon when circumstances permit? Sitting and talking to someone? Sitting quietly after lunch without alcohol? In a car, while stopped for a few minutes in traffic?   Total = 13/ 24 points   FSS endorsed at 26/ 63 points.   Social History   Socioeconomic History   Marital status: Married    Spouse name: Theatre stage manager   Number of children: 3   Years of education: 16   Highest education level: Not on file  Occupational History   Occupation: Copywriter, advertising: [employer]   Occupation: Copywriter, advertising: Advertising copywriter    Comment: Social worker  Tobacco Use   Smoking status: Never    Passive exposure: Never    Smokeless tobacco: Never  Vaping Use   Vaping status: Never Used  Substance and Sexual Activity   Alcohol use: No   Drug use: No   Sexual activity: Yes    Birth control/protection: Post-menopausal  Other Topics Concern   Not on file  Social History Narrative   Married at age 34yrs old, [id number].    Registered Nurse, works as Social worker for [employer]   no tobacco   no alcohol   no illicit drugs      Colonoscopy_DrGupta,Int hemorr. - 05/05/2000,       Flexible Sigmoidoscopy - 05/05/2000,      HBV vaccination 3-series completed `93 - 01/03/1992,                Social Determinants of Health   Financial Resource Strain: Not on file  Food Insecurity: Not on file  Transportation Needs: Not on file  Physical Activity: Not on file  Stress: Not on file  Social Connections: Not on file    Family History  Problem Relation Age of Onset   Pancreatic cancer Father        Died age 51   Arrhythmia Father        Required a permanent pacemaker   Diabetes Father    Colon cancer Cousin        Colorectal Cancer   Diabetes Mother    Thyroid disease Mother    High blood pressure Mother    High Cholesterol Mother    Colon cancer Paternal Aunt    Breast cancer Cousin    Lung cancer Maternal Uncle    Colon cancer Maternal Uncle    Lung cancer Paternal Uncle     Past Medical History:  Diagnosis Date   Acute right-sided thoracic back pain 10/30/2021   Adenomyosis    Atopic dermatitis 12/18/2012   Bloating 12/10/2015   Chest pain, unspecified 12/16/2013   Chronic idiopathic constipation 10/28/2021   Constipation    Diabetes mellitus without complication (HCC)    Dysfunction of right eustachian tube 03/24/2021   Last Assessment & Plan:   Formatting of this note might be different from the original.  Right ear fullness.  Sounds like she was treated for right acute otitis media a few weeks ago.  Pain is for the most part resolved but she persists with fullness that is  slowly improving.  She wants to make sure nothing damaging is going on.  EXAM shows normal left external canal and tympanic membrane.  Right    Essential hypertension, benign    Gastroesophageal reflux disease 10/28/2021  Hemangioma of liver 09/10/2010   09/09/10 Abdominopelvic CT found incidental 1.8 cm round mass in the left lobe of the liver which is likely a solitary cavernous hemangioma     High blood pressure    High cholesterol    History of gestational diabetes    History of postmenopausal bleeding 12/16/2013   History of pulmonary function tests 2002   Normal values.   Hypertension    IBS (irritable bowel syndrome)    Kidney cyst, acquired    left   Lesion of liver 02/13/2015   MIGRAINE, UNSPEC., W/O INTRACTABLE MIGRAINE 06/01/2006        Mondor's disease    Paresthesias 12/10/2015   Patellar tendinitis/bursitis 12/18/2012   Pelvic pain 06/05/2019   Postmenopause    Pre-diabetes    Reactive depression (situational) 03/05/2012   Urinary, incontinence, stress female 04/08/2014   Uterus, adenomyosis    Per patient's recall   Vasomotor flushing 12/16/2013   Vitamin D deficiency     Past Surgical History:  Procedure Laterality Date   Bladder "Stretched"     CARDIOVASCULAR STRESS TEST  12/03/2000   Stress Echocargiogram - 12/03/2000, NORMAL   CHOLECYSTECTOMY N/A 11/07/2019   Procedure: LAPAROSCOPIC CHOLECYSTECTOMY;  Surgeon: Andria Meuse, MD;  Location: [facility];  Service: General;  Laterality: N/A;   COLONOSCOPY  05/05/2000    Dr Chales Abrahams Rosalita Levan)- Internal Hemorrhoids   SPIROMETRY  12/03/2000   Normal Pulmonary Function Testing    TUBAL LIGATION       Current Outpatient Medications on File Prior to Visit  Medication Sig Dispense Refill   atorvastatin (LIPITOR) 20 MG tablet Take 1 tablet (20 mg total) by mouth daily. 90 tablet 0   indomethacin (INDOCIN) 50 MG capsule Take one capsule three times a day with food. Continue for three days beyond resolution of pain. 30  capsule 2   loperamide (IMODIUM A-D) 2 MG tablet Take 1 tablet (2 mg total) by mouth 4 (four) times daily as needed for diarrhea or loose stools. 30 tablet 0   metFORMIN (GLUCOPHAGE-XR) 500 MG 24 hr tablet Take 1 tablet (500 mg total) by mouth daily with breakfast for 7 days, THEN 2 tablets (1,000 mg total) daily with breakfast. 180 tablet 0   tirzepatide (MOUNJARO) 5 MG/0.5ML Pen Inject 5 mg into the skin once a week. 6.5 mL 0   No current facility-administered medications on file prior to visit.    Allergies  Allergen Reactions   Ace Inhibitors Other (See Comments)    Atopic rash   Amlodipine Besylate Other (See Comments)    Peripheral edema and dry mouth.      DIAGNOSTIC DATA (LABS, IMAGING, TESTING) - I reviewed patient records, labs, notes, testing and imaging myself where available.   HBA1 was diagnosed with DM last months, July 2024/   Lab Results  Component Value Date   WBC 7.5 09/28/2022   HGB 14.4 09/28/2022   HCT 42.4 09/28/2022   MCV 91 09/28/2022   PLT 324 09/28/2022      Component Value Date/Time   NA 138 09/28/2022 1537   K 3.9 09/28/2022 1537   CL 99 09/28/2022 1537   CO2 24 09/28/2022 1537   GLUCOSE 89 09/28/2022 1537   GLUCOSE 98 10/30/2019 1441   BUN 17 09/28/2022 1537   CREATININE 0.74 09/28/2022 1537   CREATININE 0.60 12/10/2015 0951   CALCIUM 10.0 09/28/2022 1537   PROT 7.3 09/28/2022 1537   ALBUMIN 4.2 09/28/2022 1537   AST 18 09/28/2022  1537   ALT 23 09/28/2022 1537   ALKPHOS 76 09/28/2022 1537   BILITOT 0.5 09/28/2022 1537   GFRNONAA 101 03/10/2020 1223   GFRNONAA >89 12/10/2015 0951   GFRAA 116 03/10/2020 1223   GFRAA >89 12/10/2015 0951   Lab Results  Component Value Date   CHOL 260 (H) 09/28/2022   HDL 70 09/28/2022   LDLCALC 170 (H) 09/28/2022   LDLDIRECT 190 (H) 04/19/2018   TRIG 112 09/28/2022   CHOLHDL 3.7 09/28/2022   Lab Results  Component Value Date   HGBA1C 6.9 (H) 10/03/2022   Lab Results  Component Value Date    VITAMINB12 519 12/18/2017   Lab Results  Component Value Date   TSH 0.737 10/28/2021   1 Patient Communication     1 HM Topic          Component Ref Range & Units 1 mo ago (10/03/22) 1 mo ago (09/28/22) 1 yr ago (10/28/21) 2 yr ago (04/16/20) 3 yr ago (10/30/19) 3 yr ago (05/16/19) 4 yr ago (04/19/18)  Hgb A1c MFr Bld 4.8 - 5.6 % 6.9 High  7.2 High  CM 6.2 Abnormal  R 6.0 Abnormal  R 6.0 High  CM 6.5 Abnormal  R 6.1 Abnormal  R  Comment:          Prediabetes: 5.7 - 6.4      PHYSICAL EXAM:  Today's Vitals   11/23/22 1459  BP: (!) 147/92  Pulse: 81  Weight: 198 lb (89.8 kg)  Height: 5\' 3"  (1.6 m)   Body mass index is 35.07 kg/m.   Wt Readings from Last 3 Encounters:  11/23/22 198 lb (89.8 kg)  11/10/22 199 lb 9.6 oz (90.5 kg)  09/28/22 204 lb (92.5 kg)     Ht Readings from Last 3 Encounters:  11/23/22 5\' 3"  (1.6 m)  11/10/22 5\' 4"  (1.626 m)  06/24/22 5\' 4"  (1.626 m)      General: The patient is awake, alert and appears not in acute distress. The patient is well groomed. Head: Normocephalic, atraumatic.  Neck is supple. Mallampati 3,  neck circumference:14 inches . Nasal airflow is patent.   Retrognathia is strongly  seen.  Dental status: biological  Cardiovascular:  Regular rate and cardiac rhythm by pulse,  without distended neck veins. Respiratory: Lungs are clear to auscultation.  Skin:  Without evidence of ankle edema, or rash. Trunk: The patient's posture is erect.   NEUROLOGIC EXAM: The patient is awake and alert, oriented to place and time.   Memory subjective described as intact.  Attention span & concentration ability appears normal.  Speech is fluent,  without dysarthria, dysphonia or aphasia.  Mood and affect are appropriate.   Cranial nerves: no loss of smell or taste reported  Pupils are equal and briskly reactive to light. Funduscopic exam deferred. .  Extraocular movements in vertical and horizontal planes were intact and without nystagmus. No  Diplopia. Visual fields by finger perimetry are intact. Hearing was intact to soft voice and finger rubbing.    Facial sensation intact to fine touch.  Facial motor strength is symmetric and tongue and uvula move midline.  Neck ROM : rotation, tilt and flexion extension were normal for age and shoulder shrug was symmetrical.    Motor exam:  Symmetric bulk, tone and ROM.   Normal tone without cog -wheeling, symmetric grip strength .   Sensory:   normal. Feet and hands tingle at times.  Proprioception tested in the upper extremities was normal.  Coordination: Rapid alternating movements in the fingers/hands were of normal speed.  The Finger-to-nose maneuver was intact without evidence of ataxia, dysmetria or tremor. Gait and station: Patient could rise unassisted from a seated position, walked without assistive device.  Stance is of normal width/ base and the patient turned with 3 steps.  Toe and heel walk were deferred.  Deep tendon reflexes: in the  upper and lower extremities are symmetric and intact.  Babinski response was deferred.    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   2) rather recent onset of snoring, risk  factors for OSA are retrognathia and high grade Mallampati   3) newly diagnosed as diabetic.   I like for this patient to undergo a screening test for sleep apnea first and a PSG with MSLT  at a later point if her Epworth score remains elevated.  I will order an HLA test .    I plan to follow up through our NP within 3-5 months.   I would like to thank Cole, Melanie Roach, MD for allowing me to meet with and to take care of this pleasant patient.     After spending a total time of  45  minutes face to face and additional time for physical and neurologic examination, review of laboratory studies,  personal review of  imaging studies, reports and results of other testing and review of referral information / records as far as provided in visit,   Electronically signed by: Melvyn Novas, MD 11/23/2022 3:29 PM  [facility] Board certified by The [employer] of Sleep Medicine and Diplomate of the [employer] of Sleep Medicine. Board certified In Neurology through the ABPN, Fellow of the [employer] of Neu[employer].

## 2022-11-23 NOTE — Patient Instructions (Signed)
Narcolepsy Narcolepsy is a disorder that causes people to fall asleep suddenly and without control (have sleep attacks) during the daytime. It is a lifelong disorder. Narcolepsy disrupts the sleep cycle at night, which then causes daytime sleepiness. What are the causes? The cause of narcolepsy is not fully understood, but it may be related to: Low levels of hypocretin, a chemical (neurotransmitter) in the brain that controls sleep and wake cycles. Hypocretin imbalance may be caused by: Abnormal genes that are passed from parent to child (inherited). An autoimmune disease in which the body's defense system (immune system) attacks the brain cells that make hypocretin. Infection, tumor, or injury in the area of the brain that controls sleep. Exposure to poisons (toxins), such as heavy metals, pesticides, and secondhand smoke. What are the signs or symptoms? Symptoms of this condition include: Excessive daytime sleepiness. This is the most common symptom and is usually the first symptom you will notice. This may affect your performance at work or school. Sleep attacks. You may fall asleep in the middle of an activity, especially low-energy activities like reading or watching TV. Feeling like you cannot think clearly and having trouble focusing or remembering things. You may also feel depressed. Sudden muscle weakness (cataplexy). When this occurs, your speech may become slurred, or your knees may buckle. Cataplexy is usually triggered by surprise, anger, fear, or laughter. Losing the ability to speak or move (sleep paralysis). This may occur just as you start to fall asleep or wake up. You will be aware of the paralysis. It usually lasts for just a few seconds or minutes. Seeing, hearing, tasting, smelling, or feeling things that are not real (hallucinations). Hallucinations may occur with sleep paralysis. They can happen when you are falling asleep, waking up, or dozing. Trouble staying asleep at  night (insomnia) and restless sleep. How is this diagnosed? This condition may be diagnosed based on: A physical exam to rule out any other problems that may be causing your symptoms. You may be asked to write down your sleeping patterns for several weeks in a sleep diary. This will help your health care provider make a diagnosis. Sleep studies that measure how well your REM sleep is regulated. These tests also measure your heart rate, breathing, movement, and brain waves. These tests include: An overnight sleep study (polysomnogram). A daytime sleep study that is done while you take several naps during the day (multiple sleep latency test, MSLT). This test measures how quickly you fall asleep and how quickly you enter REM sleep. Removal of spinal fluid to measure hypocretin levels. How is this treated? There is no cure for this condition, but treatment can help relieve symptoms. Treatment may include: Lifestyle and sleeping strategies to help you cope with the condition, such as: Exercising regularly. Maintaining a regular sleep schedule. Avoiding caffeine and large meals before bed. Medicines. These may include: Medicines that help keep you awake and alert (stimulants) to fight daytime sleepiness. Medicines that treat depression (antidepressants). These may be used to treat cataplexy. Sodium oxybate. This is a strong medicine to help you relax (sedative) that you may take at night. It can help control daytime sleepiness and cataplexy. Other treatments may include mental health counseling or joining a support group. Follow these instructions at home: Sleeping habits  Get about 8 hours of sleep every night. Go to sleep and get up at about the same time every day. Keep your bedroom dark, quiet, and comfortable. When you feel very tired, take short naps. Schedule naps  so that you take them at about the same time every day. Before bedtime: Avoid bright lights and screens. Relax. Try  activities like reading or taking a warm bath. Activity Get at least 20 minutes of exercise every day. This will help you sleep better at night and reduce daytime sleepiness. Avoid exercising within 3 hours of bedtime. Do not drive or use machinery if you are sleepy. If possible, take a nap before driving. Do not swim or go out on the water without a life jacket. Eating and drinking Do not drink alcohol or caffeinated beverages within 4-5 hours of bedtime. Do not eat a large meal before bedtime. Eat meals at about the same times every day. General instructions  Take over-the-counter and prescription medicines only as told by your health care provider. Keep a sleep diary as told by your health care provider. Tell your employer or teachers that you have narcolepsy. You may be able to adjust your schedule to include time for naps. Do not use any products that contain nicotine or tobacco. These products include cigarettes, chewing tobacco, and vaping devices, such as e-cigarettes. If you need help quitting, ask your health care provider. Where to find more information [employer] of Neurological Disorders and Stroke: BasicFM.no Contact a health care provider if: Your symptoms are not getting better. You have fast or irregular heartbeats (palpitations). You are having a hard time determining what is real and what is not (psychosis). Get help right away if: You hurt yourself during a sleep attack or an attack of cataplexy. You have chest pain. You have trouble breathing. These symptoms may be an emergency. Get help right away. Call 911. Do not wait to see if the symptoms will go away. Do not drive yourself to the hospital. Summary Narcolepsy is a disorder that causes people to fall asleep suddenly and without control during the daytime (sleep attacks). Narcolepsy is a lifelong disorder with no cure. Treatment can help relieve symptoms. Go to sleep and get up at about the same time  every day. Follow instructions about sleep and activities as told by your health care provider. Take over-the-counter and prescription medicines only as told by your health care provider. This information is not intended to replace advice given to you by your health care provider. Make sure you discuss any questions you have with your health care provider. Document Revised: 07/23/2021 Document Reviewed: 07/23/2021 Elsevier Patient Education  2024 Elsevier Inc.      Hypersomnia Hypersomnia is a condition in which a person feels very tired during the day even though the person gets plenty of sleep at night. A person with this condition may take naps during the day and may find it very difficult to wake up from sleep. Hypersomnia may affect a person's ability to think, concentrate, drive, or remember things. What are the causes? The cause of this condition may not be known. Possible causes include: Taking certain medicines. Using drugs or alcohol. Sleep disorders, such as narcolepsy and sleep apnea. Injury to the head, brain, or spinal cord. Tumors. Certain medical conditions. These include: Depression. Diabetes. Gastroesophageal reflux disease (GERD). An underactive thyroid gland (hypothyroidism). What are the signs or symptoms? The main symptoms of hypersomnia include: Feeling very tired throughout the day, regardless of how much sleep you got the night before. Having trouble waking up. Others may find it difficult to wake you up when you are sleeping. Sleeping for longer and longer periods at a time. Taking naps throughout the day. Other  symptoms may include: Feeling restless, anxious, or annoyed. Lacking energy. Having trouble with: Remembering. Speaking. Thinking. Loss of appetite. Seeing, hearing, tasting, smelling, or feeling things that are not real (hallucinations). How is this diagnosed? This condition may be diagnosed based on: Your symptoms and medical  history. Your sleeping habits. Your health care provider may ask you to write down your sleeping habits in a daily sleep log, along with any symptoms you have. A series of tests that are done while you sleep (sleep study or polysomnogram). A test that measures how quickly you can fall asleep during the day (daytime nap study or multiple sleep latency test). How is this treated? This condition may be treated by: Following a regular sleep routine. Making lifestyle changes, such as changing your eating habits, getting regular exercise, and avoiding alcohol or caffeinated beverages. Taking medicines to make you more alert (stimulants) during the day. Treating any underlying medical causes of hypersomnia. Follow these instructions at home: Sleep habits Stick to a routine that includes going to bed and waking up at the same times every day and night. Practice a relaxing bedtime routine. This may include reading, meditation, deep breathing, or taking a warm bath before going to sleep. Exercise regularly as told by your health care provider. However, avoid exercising in the hours right before bedtime. Keep your sleep environment at a cooler temperature, darkened, and quiet. Sleep with pillows and a mattress that are comfortable and supportive. Schedule short 20-minute naps for when you feel sleepiest during the day. Talk with your employer or teachers about your hypersomnia. If possible, adjust your schedule so that: You have a regular daytime work schedule. You can take a scheduled nap during the day. You do not have to work or be active at night. Do not eat a heavy meal for a few hours before bedtime. Eat your meals at about the same times every day. Safety  Do not drive or use machinery if you are sleepy. Ask your health care provider if it is safe for you to drive. Wear a life jacket when swimming or spending time near water. General instructions  Take over-the-counter and prescription  medicines only as told by your health care provider. This includes supplements. Avoid drinking alcohol or caffeinated beverages. Keep a sleep log that will help your health care provider manage your condition. This may include information about: What time you go to bed each night. How often you wake up at night. How many hours you sleep at night. How often and for how long you nap during the day. Any observations from others, such as leg movements during sleep, sleep walking, or snoring. Keep all follow-up visits. This is important. Contact a health care provider if: You have new symptoms. Your symptoms get worse. Get help right away if: You have thoughts about hurting yourself or someone else. Get help right away if you feel like you may hurt yourself or others, or have thoughts about taking your own life. Go to your nearest emergency room or: Call 911. Call the National Suicide Prevention Lifeline at (360) 372-8549 or 988. This is open 24 hours a day. Text the Crisis Text Line at 212-818-2688. Summary Hypersomnia refers to a condition in which you feel very tired during the day even though you get plenty of sleep at night. A person with this condition may take naps during the day and may find it very difficult to wake up from sleep. Hypersomnia may affect a person's ability to think, concentrate, drive,  or remember things. Treatment may include a regular sleep routine and making some lifestyle changes. This information is not intended to replace advice given to you by your health care provider. Make sure you discuss any questions you have with your health care provider. Document Revised: 03/01/2021 Document Reviewed: 03/01/2021 Elsevier Patient Education  2024 Elsevier Inc. ASSESSMENT AND PLAN 57 y.o. [name], RN, seen here with: HYPERSOMNIA EDS      1) lifelong hypersomnia, sleepier than her peers, lifelong vivid dreams, isolated sleep paralysis.    2) rather recent onset of  snoring, risk  factors for OSA are retrognathia and high grade Mallampati    3) newly diagnosed as diabetic.    I like for this patient to undergo a screening test for sleep apnea first and a PSG with MSLT  at a later point if her Epworth score remains elevated.  I will order an HLA test .     I plan to follow up through our NP within 3-5 months.    I would like to thank McDiarmid, Leighton Roach, MD for allowing me to meet with and to take care of this pleasant patient.

## 2022-12-01 ENCOUNTER — Encounter: Payer: Self-pay | Admitting: Family Medicine

## 2022-12-01 ENCOUNTER — Ambulatory Visit (INDEPENDENT_AMBULATORY_CARE_PROVIDER_SITE_OTHER): Payer: 59 | Admitting: Family Medicine

## 2022-12-01 VITALS — BP 138/88 | HR 84 | Ht 64.0 in | Wt 195.0 lb

## 2022-12-01 DIAGNOSIS — I10 Essential (primary) hypertension: Secondary | ICD-10-CM

## 2022-12-01 DIAGNOSIS — Z6833 Body mass index (BMI) 33.0-33.9, adult: Secondary | ICD-10-CM

## 2022-12-01 DIAGNOSIS — E669 Obesity, unspecified: Secondary | ICD-10-CM | POA: Diagnosis not present

## 2022-12-01 DIAGNOSIS — E119 Type 2 diabetes mellitus without complications: Secondary | ICD-10-CM | POA: Diagnosis not present

## 2022-12-01 MED ORDER — MOUNJARO 7.5 MG/0.5ML ~~LOC~~ SOAJ: 7.5000 mg | SUBCUTANEOUS | 0 refills | Status: DC

## 2022-12-01 NOTE — Patient Instructions (Addendum)
Our scales have you with a 9 pound weight loss since your start of Mounjaro. This is a good response to the medication.    Please contact Dr Emil Weigold by Earleen Reaper a few days befo9re it is time to refill the [facility].  Let us know if you continue to lose weight and are tolerating the medication.  We will increase the dose of Mounjaro by 2.5 mg every 4 weeks to a maximum of 15 mg, as long as you are tolerating it and it is effective.    We will plan to recheck your A1c once you have reached the maximally tolerated dose or 15 mg dose.   You will find online a list of home blood pressure monitors approved by the American Heart Association at [email or web address]

## 2022-12-02 ENCOUNTER — Encounter: Payer: Self-pay | Admitting: Family Medicine

## 2022-12-02 NOTE — Progress Notes (Signed)
[name] is alone Sources of clinical information for visit is/are patient. Nursing assessment for this office visit was reviewed with the patient for accuracy and revision.     Previous Report(s) Reviewed: none     12/01/2022    3:27 PM  Depression screen PHQ 2/9  Decreased Interest 0  Down, Depressed, Hopeless 0  PHQ - 2 Score 0  Altered sleeping 0  Tired, decreased energy 0  Change in appetite 0  Feeling bad or failure about yourself  0  Trouble concentrating 0  Moving slowly or fidgety/restless 0  Suicidal thoughts 0  PHQ-9 Score 0  Difficult doing work/chores Not difficult at all   [employer] Office Visit from 12/01/2022 in [facility] Office Visit from 09/28/2022 in [facility] Office Visit from 10/28/2021 in [facility]  Thoughts that you would be better off dead, or of hurting yourself in some way Not at all Not at all Not at all  PHQ-9 Total Score 0 4 0          03/10/2020   11:30 AM  Fall Risk   Falls in the past year? 0  Number falls in past yr: 0  Injury with Fall? 0       12/01/2022    3:27 PM 09/28/2022    2:52 PM 10/28/2021    3:44 PM  PHQ9 SCORE ONLY  PHQ-9 Total Score 0 4 0    There are no preventive care reminders to display for this patient.  Health Maintenance Due  Topic Date Due   FOOT EXAM  Never done   OPHTHALMOLOGY EXAM  Never done   Diabetic kidney evaluation - Urine ACR  Never done   Zoster Vaccines- Shingrix (1 of 2) Never done   COVID-19 Vaccine (3 - Moderna risk series) 01/23/2020   INFLUENZA VACCINE  11/03/2022      History/P.E. limitations: none  There are no preventive care reminders to display for this patient.  Diabetes Health Maintenance Due  Topic Date Due   FOOT EXAM  Never done   OPHTHALMOLOGY EXAM  Never done   HEMOGLOBIN A1C  04/05/2023    Health Maintenance Due  Topic Date Due   FOOT EXAM  Never done   OPHTHALMOLOGY EXAM  Never done    Diabetic kidney evaluation - Urine ACR  Never done   Zoster Vaccines- Shingrix (1 of 2) Never done   COVID-19 Vaccine (3 - Moderna risk series) 01/23/2020   INFLUENZA VACCINE  11/03/2022     Chief Complaint  Patient presents with   4 week f/u     --------------------------------------------------------------------------------------------------------------------------------------------- Visit Problem List with A/P  No problem-specific Assessment & Plan notes found for this encounter.

## 2022-12-02 NOTE — Assessment & Plan Note (Addendum)
Established problem Discrepancy between patient's Home BP monitor arm cuff device and our office device.  Patient's device measuring SBP significantly higher.  Recommend purchase of new BP device choosing from those Validate BP website.   Suspect BP will continue to improve with further weight loss, approaching goal of < 130/80

## 2022-12-02 NOTE — Assessment & Plan Note (Signed)
Established problem that has improved with 9 pounds loss over last 8 weeks.

## 2022-12-02 NOTE — Assessment & Plan Note (Addendum)
Established problem that has improved and has meet goal of A1c < 7.0.  Will increase Mounjaro to 7.5 mg weekly for 4 weeks.  Patient may relay her medication tolerance and wiehgt reduction every 4 weeks via MyChart. Will continue to increase by 2.5 mg increments every 4 weeks towards goal of 15 mg [place] each week for maintenance.

## 2022-12-07 ENCOUNTER — Ambulatory Visit: Payer: 59 | Admitting: Neurology

## 2022-12-07 DIAGNOSIS — F518 Other sleep disorders not due to a substance or known physiological condition: Secondary | ICD-10-CM

## 2022-12-07 DIAGNOSIS — G4753 Recurrent isolated sleep paralysis: Secondary | ICD-10-CM

## 2022-12-07 DIAGNOSIS — G4733 Obstructive sleep apnea (adult) (pediatric): Secondary | ICD-10-CM | POA: Diagnosis not present

## 2022-12-07 DIAGNOSIS — I10 Essential (primary) hypertension: Secondary | ICD-10-CM

## 2022-12-07 DIAGNOSIS — G4736 Sleep related hypoventilation in conditions classified elsewhere: Secondary | ICD-10-CM

## 2022-12-07 DIAGNOSIS — G4719 Other hypersomnia: Secondary | ICD-10-CM

## 2022-12-07 DIAGNOSIS — R0683 Snoring: Secondary | ICD-10-CM

## 2022-12-08 LAB — NARCOLEPSY EVALUATION
DQA1*01:02: NEGATIVE
DQB1*06:02: NEGATIVE

## 2022-12-13 ENCOUNTER — Encounter: Payer: Self-pay | Admitting: Neurology

## 2022-12-15 NOTE — Progress Notes (Signed)
Copy

## 2022-12-28 NOTE — Procedures (Signed)
[facility] Melanie Cole 57 year old female 03/22/1966   HOME SLEEP TEST REPORT ( by Watch PAT)   STUDY DATE:  12-15-2022    ORDERING CLINICIAN: Melvyn Novas, MD  REFERRING CLINICIAN: Dr McDiarmid, Tawanna Cooler.    CLINICAL INFORMATION/HISTORY: 11-23-2022 Chief concern according to patient :  " I have sometimes an irresistible urge to sleep, I have always been sleepier than my peers, even in childhood.  I always fell asleep on the way to church 40 minutes from home, and today I feel sleepy and like in trance". I used to have vivid dreams but not as frequent recently." She reports Hypnopompic hallucinations, and sleep paralysis- a very scary sensation.     Snoring began a couple of years ago, and most recently she went to [facility] with 2 cousins  and those reported she snored loudly, they couldn't get rest.  Her husband is witnessing her snoring, but he will go to another room to sleep and feels not bothered.      Epworth sleepiness score: Total = 13/ 24 points   FSS endorsed at 26/ 63 points.    BMI: 35 kg/m   Neck Circumference: 14'   FINDINGS:   Sleep Summary:   Total Recording Time (hours, min): 9 hours 46 minutes      Total Sleep Time (hours, min):     8 hours 33 minutes            Percent REM (%): 23%                                      Respiratory Indices by AASM criteria:   Calculated pAHI (per hour):     53.8/h   -with no central events being indicated.                     REM pAHI:    43.6/h                                             NREM pAHI:   57/h                           Positional AHI:     The patient slept 422 minutes in supine position associated with an AHI of 57.3, she slept 50 minutes on the right side with an AHI of 36.2 and 42 minutes in prone position with the AHI being 33/h.                                              Oxygen Saturation Statistics:    O2 Saturation Range (%):    Between a nadir of 82% oxygen and a maximum saturation of  97% and a mean saturation of 91%.  There were 264 desaturation events throughout the night.                                   O2 Saturation (minutes) <89%:    46 minutes the equivalent of 9% of total  sleep time. O2 saturation in minutes under 90%: 86.4 minutes or 17% of total sleep time       Pulse Rate Statistics:   Pulse Mean (bpm): 76 bpm               Pulse Range:   Between 61 and 110 bpm              IMPRESSION:  This HST confirms the presence of severe presumed obstructive sleep apnea associated with severe sleep hypoxia.  The patient had critical hypoxemia levels and a total time in hypoxia that exceeded 16.8% of the total sleep time   RECOMMENDATION: Immediately starting auto titration CPAP device.  The patient may need to return for an in lab titration to add oxygen if indicated by overnight pulse oximetry testing.  I have ordered an auto titration CPAP device between 5 and 20 cm water, 2 cm EPR, heated humidification and interface of patient's choice.  Within the first 30 days of CPAP therapy, and overnight pulse oximetry on CPAP will be performed.  A follow-up as a virtual visit should be scheduled within 30 to 45 days.    INTERPRETING PHYSICIAN:   Melvyn Novas, MD

## 2022-12-28 NOTE — Addendum Note (Signed)
Addended by: Melvyn Novas on: 12/28/2022 08:14 AM   Modules accepted: Orders

## 2022-12-30 ENCOUNTER — Other Ambulatory Visit: Payer: Self-pay | Admitting: Family Medicine

## 2022-12-30 ENCOUNTER — Encounter: Payer: Self-pay | Admitting: Family Medicine

## 2022-12-30 DIAGNOSIS — E1159 Type 2 diabetes mellitus with other circulatory complications: Secondary | ICD-10-CM

## 2022-12-30 DIAGNOSIS — E669 Obesity, unspecified: Secondary | ICD-10-CM

## 2022-12-30 DIAGNOSIS — E119 Type 2 diabetes mellitus without complications: Secondary | ICD-10-CM

## 2022-12-30 MED ORDER — MOUNJARO 7.5 MG/0.5ML ~~LOC~~ SOAJ
7.5000 mg | SUBCUTANEOUS | 0 refills | Status: DC
Start: 2022-12-30 — End: 2023-01-27

## 2022-12-30 NOTE — Progress Notes (Signed)
MyChart message form patient that she has lost weight down to 185 pounds on home scales.  She is tolerating the Mounjaro 7.5 mg [place] weekly and would like to stay at this dose for now.   She is having constipation - recommended start polyethylene glycol powder should hard stools develop.   Mild nausea - encouraged consciously ensuring taking in adequate fluid each day.  Prescription Mounjaro 7.5 mg weekly, Disp: 2 ml. RF 0  Requested patient to contact me next month to let me know how she is doing and tolerating the med.

## 2023-01-03 NOTE — Telephone Encounter (Signed)
Melanie Cole, [name]  [name], [name]; [name]; [name] New orders have been placed for the above pt, DOB: [id number] Thanks  New, [name], [name], [name]; [name]; [name]; 1 other Received, thank you!

## 2023-01-09 ENCOUNTER — Encounter: Payer: Self-pay | Admitting: Family Medicine

## 2023-01-09 DIAGNOSIS — G4733 Obstructive sleep apnea (adult) (pediatric): Secondary | ICD-10-CM | POA: Insufficient documentation

## 2023-01-27 MED ORDER — TIRZEPATIDE 10 MG/0.5ML ~~LOC~~ SOAJ: 10.0000 mg | SUBCUTANEOUS | 0 refills | Status: AC

## 2023-01-27 NOTE — Telephone Encounter (Signed)
Prescription sent for 28 day supply Mounjaro 10 mg [place] weekly.  No refill.  Request patient to contact me before further continuance of current medication dosage or change in dosage.

## 2023-02-27 MED ORDER — MOUNJARO 12.5 MG/0.5ML ~~LOC~~ SOAJ
12.5000 mg | SUBCUTANEOUS | 0 refills | Status: DC
Start: 2023-02-27 — End: 2023-03-24

## 2023-02-27 NOTE — Addendum Note (Signed)
Addended byPerley Jain, Jen Eppinger D on: 02/27/2023 10:25 AM   Modules accepted: Orders

## 2023-03-08 ENCOUNTER — Encounter: Payer: Self-pay | Admitting: Family Medicine

## 2023-03-08 MED ORDER — DILTIAZEM HCL ER 90 MG PO CP12
90.0000 mg | ORAL_CAPSULE | Freq: Two times a day (BID) | ORAL | 0 refills | Status: DC
Start: 2023-03-08 — End: 2023-10-19

## 2023-03-08 NOTE — Addendum Note (Signed)
Addended byPerley Jain, Christyna Letendre D on: 03/08/2023 04:00 PM   Modules accepted: Orders

## 2023-03-08 NOTE — Progress Notes (Signed)
Phone message from Ms Privette with blood pressure readings with several over 130/80.  Prescription sent for Diltiazem SR 90 mg twice a day. Disp#30, RF-0  Message sent to patient to send report of BPs after two weeks.

## 2023-03-20 ENCOUNTER — Telehealth: Payer: Self-pay | Admitting: Neurology

## 2023-03-20 NOTE — Telephone Encounter (Signed)
Received a ONO on CPAP that pt completed 03/19/2023-03/20/2023. It was 7 hours 13 min and 20 sec recorded. The lowest the oxygen level dipped was 90% but thankfully never stayed low. There is no need for oxygen to be added. Will have Dr Dohmeier review as well and add input if she has any.

## 2023-03-22 ENCOUNTER — Encounter: Payer: Self-pay | Admitting: Neurology

## 2023-03-22 ENCOUNTER — Ambulatory Visit (INDEPENDENT_AMBULATORY_CARE_PROVIDER_SITE_OTHER): Payer: 59 | Admitting: Neurology

## 2023-03-22 VITALS — BP 125/79 | HR 82 | Ht 63.0 in | Wt 172.0 lb

## 2023-03-22 DIAGNOSIS — R634 Abnormal weight loss: Secondary | ICD-10-CM | POA: Diagnosis not present

## 2023-03-22 DIAGNOSIS — G4733 Obstructive sleep apnea (adult) (pediatric): Secondary | ICD-10-CM | POA: Diagnosis not present

## 2023-03-22 NOTE — Patient Instructions (Addendum)
CPAP and BIPAP Information CPAP and BIPAP are methods that use air pressure to keep your airways open and to help you breathe well. CPAP and BIPAP use different amounts of pressure. Your health care provider will tell you whether CPAP or BIPAP would be more helpful for you. CPAP stands for "continuous positive airway pressure." With CPAP, the amount of pressure stays the same while you breathe in (inhale) and out (exhale). BIPAP stands for "bi-level positive airway pressure." With BIPAP, the amount of pressure will be higher when you inhale and lower when you exhale. This allows you to take larger breaths. CPAP or BIPAP may be used in the hospital, or your health care provider may want you to use it at home. You may need to have a sleep study before your health care provider can order a machine for you to use at home. What are the advantages? CPAP or BIPAP can be helpful if you have: Sleep apnea. Chronic obstructive pulmonary disease (COPD). Heart failure. Medical conditions that cause muscle weakness, including muscular dystrophy or amyotrophic lateral sclerosis (ALS). Other problems that cause breathing to be shallow, weak, abnormal, or difficult. CPAP and BIPAP are most commonly used for obstructive sleep apnea (OSA) to keep the airways from collapsing when the muscles relax during sleep. What are the risks? Generally, this is a safe treatment. However, problems may occur, including: Irritated skin or skin sores if the mask does not fit properly. Dry or stuffy nose or nosebleeds. Dry mouth. Feeling gassy or bloated. Sinus or lung infection if the equipment is not cleaned properly. When should CPAP or BIPAP be used? In most cases, the mask only needs to be worn during sleep. Generally, the mask needs to be worn throughout the night and during any daytime naps. People with certain medical conditions may also need to wear the mask at other times, such as when they are awake. Follow  instructions from your health care provider about when to use the machine. What happens during CPAP or BIPAP?  Both CPAP and BIPAP are provided by a small machine with a flexible plastic tube that attaches to a plastic mask that you wear. Air is blown through the mask into your nose or mouth. The amount of pressure that is used to blow the air can be adjusted on the machine. Your health care provider will set the pressure setting and help you find the best mask for you. Tips for using the mask Because the mask needs to be snug, some people feel trapped or closed-in (claustrophobic) when first using the mask. If you feel this way, you may need to get used to the mask. One way to do this is to hold the mask loosely over your nose or mouth and then gradually apply the mask more snugly. You can also gradually increase the amount of time that you use the mask. Masks are available in various types and sizes. If your mask does not fit well, talk with your health care provider about getting a different one. Some common types of masks include: Full face masks, which fit over the mouth and nose. Nasal masks, which fit over the nose. Nasal pillow or prong masks, which fit into the nostrils. If you are using a mask that fits over your nose and you tend to breathe through your mouth, a chin strap may be applied to help keep your mouth closed. Use a skin barrier to protect your skin as told by your health care provider.  Some CPAP and BIPAP machines have alarms that may sound if the mask comes off or develops a leak. If you have trouble with the mask, it is very important that you talk with your health care provider about finding a way to make the mask easier to tolerate. Do not stop using the mask. There could be a negative impact on your health if you stop using the mask. Tips for using the machine Place your CPAP or BIPAP machine on a secure table or stand near an electrical outlet. Know where the on/off switch  is on the machine. Follow instructions from your health care provider about how to set the pressure on your machine and when you should use it. Do not eat or drink while the CPAP or BIPAP machine is on. Food or fluids could get pushed into your lungs by the pressure of the CPAP or BIPAP. For home use, CPAP and BIPAP machines can be rented or purchased through home health care companies. Many different brands of machines are available. Renting a machine before purchasing may help you find out which particular machine works well for you. Your health insurance company may also decide which machine you may get. Keep the CPAP or BIPAP machine and attachments clean. Ask your health care provider for specific instructions. Check the humidifier if you have a dry stuffy nose or nosebleeds. Make sure it is working correctly. Follow these instructions at home: Take over-the-counter and prescription medicines only as told by your health care provider. Ask if you can take sinus medicine if your sinuses are blocked. Do not use any products that contain nicotine or tobacco. These products include cigarettes, chewing tobacco, and vaping devices, such as e-cigarettes. If you need help quitting, ask your health care provider. Keep all follow-up visits. This is important. Contact a health care provider if: You have redness or pressure sores on your head, face, mouth, or nose from the mask or head gear. You have trouble using the CPAP or BIPAP machine. You cannot tolerate wearing the CPAP or BIPAP mask. Someone tells you that you snore even when wearing your CPAP or BIPAP. Get help right away if: You have trouble breathing. You feel confused. Summary CPAP and BIPAP are methods that use air pressure to keep your airways open and to help you breathe well. If you have trouble with the mask, it is very important that you talk with your health care provider about finding a way to make the mask easier to tolerate. Do not  stop using the mask. There could be a negative impact to your health if you stop using the mask. Follow instructions from your health care provider about when to use the machine. This information is not intended to replace advice given to you by your health care provider. Make sure you discuss any questions you have with your health care provider. Document Revised: 10/28/2020 Document Reviewed: 02/28/2020 Elsevier Patient Education  2023 Elsevier Inc. ASSESSMENT AND PLAN 57 y.o. year old female  here with:    1) Nasal mask and auto CPAP , highly compliant and showing great clinical benefit.  She resisted the urging of the DME employees to switch to a fullface mask and she has done very well on a nasal interface.  She has retrognathia.  She feels much more alert and sleeps deeper and longer.    And her additional weight loss certainly will help to control of apnea.  She may experience aerophagia if the CPAP pressure is too  high.   At this time there is no sleep paralysis no longer loud snoring and no excessive daytime sleepiness present.  Rv in 12 months with NP .      I would like to thank McDiarmid, Leighton Roach, MD ,  for allowing me to meet with and to take care of this pleasant patient.

## 2023-03-22 NOTE — Progress Notes (Signed)
Provider:  Melvyn Novas, MD  Primary Care Physician:  McDiarmid, Leighton Roach, MD [address] [place] [address]     Referring Provider: Etta Grandchild, Md [address] [place],  [place] [address]          Chief Complaint according to patient   Patient presents with:     New Patient (Initial Visit)           HISTORY OF PRESENT ILLNESS:  Melanie Cole is a 57 y.o. female patient who is here for revisit 03/22/2023 for  CPAP compliance and after recent ( negative ) ONO.  [name]  Chief concern according to patient :  " I have not felt as refreshed as I did initially with CPAP, but still much better than I used to feel before CPAP".  The home sleep test then diagnosed the patient with ongoing sleep apnea was performed on 9-12 2024.  And at her initial visit the patient reported an irresistible urge to sleep she had always been sleepier than her peers even in childhood.  She always fell asleep on her way to church 40 minutes from home and she feels still sleepy and sometimes like in toss when she is at work or in social situations.  She used to have vivid dreams but these have not been as frequently recently she reported hypnopompic hallucinations and sleep paralysis which have been as very scary sensation.  Snoring began a couple of years ago and most recently she she had a room in [place] with 2 cousins and those reported to her that she snored loudly.  She had endorsed the Epworth sleepiness score at 13 and the fatigue severity score at 26 points.  Her home sleep test confirms the presence of sleep apnea and a severe degree of  Overall AHI, apnea hypopnea index, was 53.8/h and higher in non-REM sleep than in REM sleep.  This is based on AASM criteria of scoring.  She had 23% REM sleep, a total recording time of sleep was 8 hours 33 minutes, her oxygen nadir was 82%, her heart rate varied between 61 and 110 bpm.    The patient started on an auto titration  device with a setting between 5 and 18 cm water, 3 cm EPR. The residual AHI is 0.8/h this is a reduction of over 95%.  An overnight pulse oximeter she had been obtained after she fell asleep alertness weaning off a little bit it was done through [facility].  And it was done on the night of the 15th through 16 December just 2 days ago.  It showed no need for oxygen her total time of sleep recorded was 7 hours 13 minutes, and she did not drop which below 89% oxygen saturation for the whole night.   She was started on [facility] and has lost over 30 pounds since the treatment began.  This was in July 2024.  I am sure that this has also helped her underlying apnea condition.             Review of Systems: Out of a complete 14 system review, the patient complains of only the following symptoms, and all other reviewed systems are negative.:  Fatigue, sleepiness , snoring,    How likely are you to doze in the following situations: 0 = not likely, 1 = slight chance, 2 = moderate chance, 3 = high chance   Sitting and Reading? Watching Television?  Sitting inactive in a public place (theater or meeting)? As a passenger in a car for an hour without a break? Lying down in the afternoon when circumstances permit? Sitting and talking to someone? Sitting quietly after lunch without alcohol? In a car, while stopped for a few minutes in traffic?   Total = 8/ 24 points   FSS endorsed at 19 point/ 63 points.   Social History   Socioeconomic History   Marital status: Married    Spouse name: Theatre stage manager   Number of children: 3   Years of education: 16   Highest education level: Not on file  Occupational History   Occupation: Copywriter, advertising: [employer]   Occupation: Copywriter, advertising: Advertising copywriter    Comment: Social worker  Tobacco Use   Smoking status: Never    Passive exposure: Never   Smokeless tobacco: Never  Vaping Use   Vaping status: Never Used   Substance and Sexual Activity   Alcohol use: No   Drug use: No   Sexual activity: Yes    Birth control/protection: Post-menopausal  Other Topics Concern   Not on file  Social History Narrative   Married at age 26yrs old, [id number].    Registered Nurse, works as Social worker for [employer]   no tobacco   no alcohol   no illicit drugs      Colonoscopy_DrGupta,Int hemorr. - 05/05/2000,       Flexible Sigmoidoscopy - 05/05/2000,      HBV vaccination 3-series completed `93 - 01/03/1992,                Social Drivers of Corporate investment banker Strain: Not on file  Food Insecurity: Not on file  Transportation Needs: Not on file  Physical Activity: Not on file  Stress: Not on file  Social Connections: Not on file    Family History  Problem Relation Age of Onset   Diabetes Mother    Thyroid disease Mother    High blood pressure Mother    High Cholesterol Mother    Pancreatic cancer Father        Died age 40   Arrhythmia Father        Required a permanent pacemaker   Diabetes Father    Lung cancer Maternal Uncle    Colon cancer Maternal Uncle    Colon cancer Paternal Aunt    Lung cancer Paternal Uncle    Colon cancer Cousin        Colorectal Cancer   Breast cancer Cousin    Sleep apnea Neg Hx     Past Medical History:  Diagnosis Date   Acute right-sided thoracic back pain 10/30/2021   Adenomyosis    Atopic dermatitis 12/18/2012   Bloating 12/10/2015   Chest pain, unspecified 12/16/2013   Chronic idiopathic constipation 10/28/2021   Constipation    Diabetes mellitus without complication (HCC)    Dysfunction of right eustachian tube 03/24/2021   Last Assessment & Plan:   Formatting of this note might be different from the original.  Right ear fullness.  Sounds like she was treated for right acute otitis media a few weeks ago.  Pain is for the most part resolved but she persists with fullness that is slowly improving.  She wants to make sure nothing  damaging is going on.  EXAM shows normal left external canal and tympanic membrane.  Right    Essential hypertension, benign  Gastroesophageal reflux disease 10/28/2021   Hemangioma of liver 09/10/2010   09/09/10 Abdominopelvic CT found incidental 1.8 cm round mass in the left lobe of the liver which is likely a solitary cavernous hemangioma     High blood pressure    High cholesterol    History of gestational diabetes    History of postmenopausal bleeding 12/16/2013   History of pulmonary function tests 2002   Normal values.   Hypertension    IBS (irritable bowel syndrome)    Kidney cyst, acquired    left   Lesion of liver 02/13/2015   MIGRAINE, UNSPEC., W/O INTRACTABLE MIGRAINE 06/01/2006        Mondor's disease    Paresthesias 12/10/2015   Patellar tendinitis/bursitis 12/18/2012   Pelvic pain 06/05/2019   Postmenopause    Pre-diabetes    Reactive depression (situational) 03/05/2012   Urinary, incontinence, stress female 04/08/2014   Uterus, adenomyosis    Per patient's recall   Vasomotor flushing 12/16/2013   Vitamin D deficiency     Past Surgical History:  Procedure Laterality Date   Bladder "Stretched"     CARDIOVASCULAR STRESS TEST  12/03/2000   Stress Echocargiogram - 12/03/2000, NORMAL   CHOLECYSTECTOMY N/A 11/07/2019   Procedure: LAPAROSCOPIC CHOLECYSTECTOMY;  Surgeon: Andria Meuse, MD;  Location: [facility];  Service: General;  Laterality: N/A;   COLONOSCOPY  05/05/2000    Dr Chales Abrahams Rosalita Levan)- Internal Hemorrhoids   SPIROMETRY  12/03/2000   Normal Pulmonary Function Testing    TUBAL LIGATION       Current Outpatient Medications on File Prior to Visit  Medication Sig Dispense Refill   diltiazem (CARDIZEM SR) 90 MG 12 hr capsule Take 1 capsule (90 mg total) by mouth 2 (two) times daily. 60 capsule 0   indomethacin (INDOCIN) 50 MG capsule Take one capsule three times a day with food. Continue for three days beyond resolution of pain. 30 capsule 2   loperamide  (IMODIUM A-D) 2 MG tablet Take 1 tablet (2 mg total) by mouth 4 (four) times daily as needed for diarrhea or loose stools. 30 tablet 0   tirzepatide (MOUNJARO) 12.5 MG/0.5ML Pen Inject 12.5 mg into the skin once a week for 28 days. 2 mL 0   atorvastatin (LIPITOR) 20 MG tablet Take 1 tablet (20 mg total) by mouth daily. (Patient not taking: Reported on 03/22/2023) 90 tablet 0   metFORMIN (GLUCOPHAGE-XR) 500 MG 24 hr tablet Take 1 tablet (500 mg total) by mouth daily with breakfast for 7 days, THEN 2 tablets (1,000 mg total) daily with breakfast. 180 tablet 0   No current facility-administered medications on file prior to visit.    Allergies  Allergen Reactions   Ace Inhibitors Other (See Comments)    Atopic rash   Amlodipine Besylate Other (See Comments)    Peripheral edema and dry mouth.      DIAGNOSTIC DATA (LABS, IMAGING, TESTING) - I reviewed patient records, labs, notes, testing and imaging myself where available.  Lab Results  Component Value Date   WBC 7.5 09/28/2022   HGB 14.4 09/28/2022   HCT 42.4 09/28/2022   MCV 91 09/28/2022   PLT 324 09/28/2022      Component Value Date/Time   NA 138 09/28/2022 1537   K 3.9 09/28/2022 1537   CL 99 09/28/2022 1537   CO2 24 09/28/2022 1537   GLUCOSE 89 09/28/2022 1537   GLUCOSE 98 10/30/2019 1441   BUN 17 09/28/2022 1537   CREATININE 0.74 09/28/2022 1537  CREATININE 0.60 12/10/2015 0951   CALCIUM 10.0 09/28/2022 1537   PROT 7.3 09/28/2022 1537   ALBUMIN 4.2 09/28/2022 1537   AST 18 09/28/2022 1537   ALT 23 09/28/2022 1537   ALKPHOS 76 09/28/2022 1537   BILITOT 0.5 09/28/2022 1537   GFRNONAA 101 03/10/2020 1223   GFRNONAA >89 12/10/2015 0951   GFRAA 116 03/10/2020 1223   GFRAA >89 12/10/2015 0951   Lab Results  Component Value Date   CHOL 260 (H) 09/28/2022   HDL 70 09/28/2022   LDLCALC 170 (H) 09/28/2022   LDLDIRECT 190 (H) 04/19/2018   TRIG 112 09/28/2022   CHOLHDL 3.7 09/28/2022   Lab Results  Component Value  Date   HGBA1C 6.9 (H) 10/03/2022   Lab Results  Component Value Date   VITAMINB12 519 12/18/2017   Lab Results  Component Value Date   TSH 0.737 10/28/2021    PHYSICAL EXAM:  Today's Vitals   03/22/23 1554  BP: 125/79  Pulse: 82  Weight: 172 lb (78 kg)  Height: 5\' 3"  (1.6 m)   Body mass index is 30.47 kg/m.   Wt Readings from Last 3 Encounters:  03/22/23 172 lb (78 kg)  12/01/22 195 lb (88.5 kg)  11/23/22 198 lb (89.8 kg)     Ht Readings from Last 3 Encounters:  03/22/23 5\' 3"  (1.6 m)  12/01/22 5\' 4"  (1.626 m)  11/23/22 5\' 3"  (1.6 m)      General: The patient is awake, alert and appears not in acute distress. The patient is well groomed. The patient is awake, alert and appears not in acute distress. The patient is well groomed. Head: Normocephalic, atraumatic.  Neck is supple. Mallampati 3,  neck circumference:14 inches . Nasal airflow is patent.   Retrognathia is strongly  seen.  Dental status: biological  Cardiovascular:  Regular rate and cardiac rhythm by pulse,  without distended neck veins. Respiratory: Lungs are clear to auscultation.  Skin:  Without evidence of ankle edema, or rash. Trunk: The patient's posture is erect.   NEUROLOGIC EXAM: The patient is awake and alert, oriented to place and time.   Memory subjective described as intact.  Attention span & concentration ability appears normal.  Speech is fluent,  without dysarthria, dysphonia or aphasia.  Mood and affect are appropriate.   Cranial nerves: no loss of smell or taste reported  Pupils are equal and briskly reactive to light. Funduscopic exam deferred. .  Extraocular movements in vertical and horizontal planes were intact and without nystagmus. No Diplopia. Visual fields by finger perimetry are intact. Hearing was intact to soft voice and finger rubbing.    Facial sensation intact to fine touch.  Facial motor strength is symmetric and tongue and uvula move midline.  Neck ROM : rotation,  tilt and flexion extension were normal for age and shoulder shrug was symmetrical.    Motor exam:  Symmetric bulk, tone and ROM.   Normal tone without cog -wheeling, symmetric grip strength .   Sensory:   normal. Feet and hands tingle at times.  Proprioception tested in the upper extremities was normal.   Coordination: Rapid alternating movements in the fingers/hands were of normal speed.  The Finger-to-nose maneuver was intact without evidence of ataxia, dysmetria or tremor. Gait and station: Patient could rise unassisted from a seated position, walked without assistive device.  Stance is of normal width/ base and the patient turned with 3 steps.  Toe and heel walk were deferred.  Deep tendon reflexes: in the  upper and lower extremities are symmetric and intact.  Babinski response was deferred.    ASSESSMENT AND PLAN 57 y.o. year old female  here with:    1) Nasal mask and auto CPAP , highly compliant and showing great clinical benefit.  She resisted the urging of the DME employees to switch to a fullface mask and she has done very well on a nasal interface.  She has retrognathia.  She feels much more alert and sleeps deeper and longer.    And her additional weight loss certainly will help to control of apnea.  She may experience aerophagia if the CPAP pressure is too high.   At this time there is no sleep paralysis no longer loud snoring and no excessive daytime sleepiness present.  Rv in 12 months. With NP .      I would like to thank McDiarmid, Leighton Roach, MD and Etta Grandchild, Md [address] [place],  [place] [address] for allowing me to meet with and to take care of this pleasant patient.    After spending a total time of  23  minutes face to face and additional time for physical and neurologic examination, review of laboratory studies,  personal review of imaging studies, reports and results of other testing and review of referral information / records as far as  provided in visit,   Electronically signed by: Melvyn Novas, MD 03/22/2023 4:16 PM  [facility] Board certified by The [employer] of Sleep Medicine and Diplomate of the [employer] of [employer] Medicine. Board certified In Neurology through the ABPN, Fellow of the [employer] of Neu[employer].

## 2023-03-24 MED ORDER — MOUNJARO 12.5 MG/0.5ML ~~LOC~~ SOAJ
12.5000 mg | SUBCUTANEOUS | 0 refills | Status: DC
Start: 2023-03-24 — End: 2023-04-24

## 2023-03-24 NOTE — Addendum Note (Signed)
Addended by: Acquanetta Belling D on: 03/24/2023 02:51 PM   Modules accepted: Orders

## 2023-04-12 ENCOUNTER — Encounter: Payer: Self-pay | Admitting: Neurology

## 2023-04-23 ENCOUNTER — Other Ambulatory Visit: Payer: Self-pay | Admitting: Family Medicine

## 2023-04-23 DIAGNOSIS — E119 Type 2 diabetes mellitus without complications: Secondary | ICD-10-CM

## 2023-06-07 ENCOUNTER — Ambulatory Visit: Admitting: Family Medicine

## 2023-06-07 ENCOUNTER — Encounter: Payer: Self-pay | Admitting: Family Medicine

## 2023-06-07 VITALS — BP 124/90 | HR 97 | Ht 63.0 in | Wt 157.1 lb

## 2023-06-07 DIAGNOSIS — E1169 Type 2 diabetes mellitus with other specified complication: Secondary | ICD-10-CM

## 2023-06-07 DIAGNOSIS — E785 Hyperlipidemia, unspecified: Secondary | ICD-10-CM

## 2023-06-07 DIAGNOSIS — E049 Nontoxic goiter, unspecified: Secondary | ICD-10-CM | POA: Diagnosis not present

## 2023-06-07 DIAGNOSIS — E790 Hyperuricemia without signs of inflammatory arthritis and tophaceous disease: Secondary | ICD-10-CM | POA: Diagnosis not present

## 2023-06-07 DIAGNOSIS — R0789 Other chest pain: Secondary | ICD-10-CM | POA: Diagnosis not present

## 2023-06-07 DIAGNOSIS — Z7689 Persons encountering health services in other specified circumstances: Secondary | ICD-10-CM

## 2023-06-07 DIAGNOSIS — E1159 Type 2 diabetes mellitus with other circulatory complications: Secondary | ICD-10-CM

## 2023-06-07 DIAGNOSIS — Z7985 Long-term (current) use of injectable non-insulin antidiabetic drugs: Secondary | ICD-10-CM

## 2023-06-07 DIAGNOSIS — I152 Hypertension secondary to endocrine disorders: Secondary | ICD-10-CM

## 2023-06-07 DIAGNOSIS — E7800 Pure hypercholesterolemia, unspecified: Secondary | ICD-10-CM

## 2023-06-07 DIAGNOSIS — E119 Type 2 diabetes mellitus without complications: Secondary | ICD-10-CM

## 2023-06-07 LAB — POCT GLYCOSYLATED HEMOGLOBIN (HGB A1C): HbA1c, POC (controlled diabetic range): 5.4 % (ref 0.0–7.0)

## 2023-06-07 NOTE — Assessment & Plan Note (Signed)
 Check TSH andc palpate thyroid next office visit

## 2023-06-07 NOTE — Assessment & Plan Note (Signed)
 Lab Results  Component Value Date   HGBA1C 5.4 06/07/2023  Down from 6.9% in July last year. Wt Readings from Last 3 Encounters:  06/07/23 157 lb 2 oz (71.3 kg)  03/22/23 172 lb (78 kg)  12/01/22 195 lb (88.5 kg)    No longer taking metformin Taking Mounjaro 12.5 mg [place] weekly.  Great improvement.  Into non-diabetic/non-prediabetic range.  Needs UACR next office visit.

## 2023-06-07 NOTE — Assessment & Plan Note (Addendum)
 Established problem that has greatly improved and has meet goal of initial goals of reducing weight to nearly below overweight category. Melanie Cole would like to lose another 7 to 10 pounds. She is tolerating the Mounjaro.  Denies persistent abdominal pain.  No nausea & vomiting  (+) belching, but not significantly worse from baseline.   Continue Mounjaro 12.5 mg weekly [place]. Melanie Cole may request refills for next 6 months.   Recommended Melanie Cole take MVI daily

## 2023-06-07 NOTE — Progress Notes (Signed)
 Melanie Cole is alone Sources of clinical information for visit is/are patient. Nursing assessment for this office visit was reviewed with the patient for accuracy and revision.     Previous Report(s) Reviewed: none     06/07/2023    8:27 AM  Depression screen PHQ 2/9  Decreased Interest 0  Down, Depressed, Hopeless 0  PHQ - 2 Score 0  Altered sleeping 0  Tired, decreased energy 0  Change in appetite 0  Feeling bad or failure about yourself  0  Trouble concentrating 0  Moving slowly or fidgety/restless 0  Suicidal thoughts 0  PHQ-9 Score 0   Flowsheet Row Office Visit from 06/07/2023 in [facility] - A Dept Of [place]. [facility] Office Visit from 12/01/2022 in [facility] - A Dept Of Eligha Bridegroom. [facility] Office Visit from 09/28/2022 in [facility] - A Dept Of Eligha Bridegroom. [facility]  Thoughts that you would be better off dead, or of hurting yourself in some way Not at all Not at all Not at all  PHQ-9 Total Score 0 0 4          03/10/2020   11:30 AM  Fall Risk   Falls in the past year? 0  Number falls in past yr: 0  Injury with Fall? 0       06/07/2023    8:27 AM 12/01/2022    3:27 PM 09/28/2022    2:52 PM  PHQ9 SCORE ONLY  PHQ-9 Total Score 0 0 4    There are no preventive care reminders to display for this patient.  Health Maintenance Due  Topic Date Due   Pneumococcal Vaccine 67-16 Years old (1 of 2 - PCV) Never done   FOOT EXAM  Never done   OPHTHALMOLOGY EXAM  Never done   Diabetic kidney evaluation - Urine ACR  Never done   Zoster Vaccines- Shingrix (1 of 2) Never done      History/P.E. limitations: none  There are no preventive care reminders to display for this patient.  Diabetes Health Maintenance Due  Topic Date Due   FOOT EXAM  Never done   OPHTHALMOLOGY EXAM  Never done   HEMOGLOBIN A1C  12/08/2023    Health Maintenance Due  Topic Date Due   Pneumococcal Vaccine 41-85  Years old (1 of 2 - PCV) Never done   FOOT EXAM  Never done   OPHTHALMOLOGY EXAM  Never done   Diabetic kidney evaluation - Urine ACR  Never done   Zoster Vaccines- Shingrix (1 of 2) Never done     Chief Complaint  Patient presents with   Follow-up    A1C      --------------------------------------------------------------------------------------------------------------------------------------------- Visit Problem List with A/P  Chest pain, non-cardiac Recurrent concern Onset: yesterday Location: left lower precordial Quality: sharp Severity: moderate Function: occurred at rest in bed Duration: ~ 10 minutes Pattern: solitary Radiation: into back Relief: spontaneous Precipitant: none Associated Symptoms: No tearing sensation described No migration of pain described No significant impact on ability to function       Trauma (Acute or Chronic): no Prior Diagnostic Testing or Treatments:  Nuc Med cardiac study 2019: low risk significant CAD Relevant PMH/PSH: DMT2, Hypertension, hyperlipidemia  VS reviewed Chest: Tender to palpation left lower precordium, no rash  A/ Atypical presentation in patient with moderate risk CAD P/ Monitor for recurrence, or worsening. Report changes to our office.  Thyroid enlargement Check TSH andc palpate thyroid next office visit   Hyperuricemia Established problem Well Controlled. Patient is at goal of no recurrence of acute arthritis. Will recheck UA with her wt loss.    Type 2 diabetes mellitus without complications [facility]) Lab Results  Component Value Date   HGBA1C 5.4 06/07/2023  Down from 6.9% in July last year. Wt Readings from Last 3 Encounters:  06/07/23 157 lb 2 oz (71.3 kg)  03/22/23 172 lb (78 kg)  12/01/22 195 lb (88.5 kg)    No longer taking metformin Taking Mounjaro 12.5 mg [place] weekly.  Great improvement.  Into non-diabetic/non-prediabetic range.  Needs UACR next office visit.     Hypertension associated  with diabetes (HCC) Established problem Well Controlled. Patient is at goal of SBP < 130. Ms Oka has stopped her diltiazem. She is no longer taking any antihypertensives.   Significant weight loss has relieved Ms Giampietro of the need to take antihypertenisve medications.  Will monitor BP at office visit, but as long as weight reduction remains, will not need visits specifically for hypertension.    Encounter for weight management Established problem that has greatly improved and has meet goal of initial goals of reducing weight to nearly below overweight category. Ms Rutkowski would like to lose another 7 to 10 pounds. She is tolerating the Mounjaro.  Denies persistent abdominal pain.  No nausea & vomiting  (+) belching, but not significantly worse from baseline.   Continue Mounjaro 12.5 mg weekly [place]. Ms Eddleman may request refills for next 6 months.   Recommended Ms Folkerts take MVI daily

## 2023-06-07 NOTE — Progress Notes (Signed)
 Melanie Cole is alone Sources of clinical information for visit is/are patient. Nursing assessment for this office visit was reviewed with the patient for accuracy and revision.     Previous Report(s) Reviewed: none     06/07/2023    8:27 AM  Depression screen PHQ 2/9  Decreased Interest 0  Down, Depressed, Hopeless 0  PHQ - 2 Score 0  Altered sleeping 0  Tired, decreased energy 0  Change in appetite 0  Feeling bad or failure about yourself  0  Trouble concentrating 0  Moving slowly or fidgety/restless 0  Suicidal thoughts 0  PHQ-9 Score 0   Flowsheet Row Office Visit from 06/07/2023 in [facility] - A Dept Of [place]. [facility] Office Visit from 12/01/2022 in [facility] - A Dept Of Eligha Bridegroom. [facility] Office Visit from 09/28/2022 in [facility] - A Dept Of Eligha Bridegroom. [facility]  Thoughts that you would be better off dead, or of hurting yourself in some way Not at all Not at all Not at all  PHQ-9 Total Score 0 0 4          03/10/2020   11:30 AM  Fall Risk   Falls in the past year? 0  Number falls in past yr: 0  Injury with Fall? 0       06/07/2023    8:27 AM 12/01/2022    3:27 PM 09/28/2022    2:52 PM  PHQ9 SCORE ONLY  PHQ-9 Total Score 0 0 4    There are no preventive care reminders to display for this patient.  Health Maintenance Due  Topic Date Due   Pneumococcal Vaccine 61-31 Years old (1 of 2 - PCV) Never done   FOOT EXAM  Never done   OPHTHALMOLOGY EXAM  Never done   Diabetic kidney evaluation - Urine ACR  Never done   Zoster Vaccines- Shingrix (1 of 2) Never done      History/P.E. limitations: none  There are no preventive care reminders to display for this patient.  Diabetes Health Maintenance Due  Topic Date Due   FOOT EXAM  Never done   OPHTHALMOLOGY EXAM  Never done   HEMOGLOBIN A1C  12/08/2023    Health Maintenance Due  Topic Date Due   Pneumococcal Vaccine 69-40  Years old (1 of 2 - PCV) Never done   FOOT EXAM  Never done   OPHTHALMOLOGY EXAM  Never done   Diabetic kidney evaluation - Urine ACR  Never done   Zoster Vaccines- Shingrix (1 of 2) Never done     Chief Complaint  Patient presents with   Follow-up    A1C      --------------------------------------------------------------------------------------------------------------------------------------------- Visit Problem List with A/P  Chest pain, non-cardiac Recurrent concern Onset: yesterday Location: left lower precordial Quality: sharp Severity: moderate Function: occurred at rest in bed Duration: ~ 10 minutes Pattern: solitary Radiation: into back Relief: spontaneous Precipitant: none Associated Symptoms: No tearing sensation described No migration of pain described No significant impact on ability to function       Trauma (Acute or Chronic): no Prior Diagnostic Testing or Treatments:  Nuc Med cardiac study 2019: low risk significant CAD Relevant PMH/PSH: DMT2, Hypertension, hyperlipidemia  VS reviewed Chest: Tender to palpation left lower precordium, no rash  A/ Atypical presentation in patient with moderate risk CAD P/ Monitor for recurrence, or worsening. Report changes to our office.  Thyroid enlargement Check TSH andc palpate thyroid next office visit   Hyperuricemia Established problem Well Controlled. Patient is at goal of no recurrence of acute arthritis. Will recheck UA with her wt loss.    Type 2 diabetes mellitus without complications [facility]) Lab Results  Component Value Date   HGBA1C 5.4 06/07/2023  Down from 6.9% in July last year. Wt Readings from Last 3 Encounters:  06/07/23 157 lb 2 oz (71.3 kg)  03/22/23 172 lb (78 kg)  12/01/22 195 lb (88.5 kg)    No longer taking metformin Taking Mounjaro 12.5 mg [place] weekly.  Great improvement.  Into non-diabetic/non-prediabetic range.  Needs UACR next office visit.     Hypertension associated  with diabetes (HCC) Established problem Well Controlled. Patient is at goal of SBP < 130. Melanie Cole has stopped her diltiazem. She is no longer taking any antihypertensives.   Significant weight loss has relieved Melanie Cole of the need to take antihypertenisve medications.  Will monitor BP at office visit, but as long as weight reduction remains, will not need visits specifically for hypertension.    Encounter for weight management Established problem that has greatly improved and has meet goal of initial goals of reducing weight to nearly below overweight category. Melanie Cole would like to lose another 7 to 10 pounds. She is tolerating the Mounjaro.  Denies persistent abdominal pain.  No nausea & vomiting  (+) belching, but not significantly worse from baseline.   Continue Mounjaro 12.5 mg weekly [place]. Melanie Cole may request refills for next 6 months.   Recommended Melanie Cole take MVI daily    Hyperlipidemia associated with type 2 diabetes mellitus [facility]) Established problem Melanie Cole has stopped atorvastatin. Will check LDL and advise.

## 2023-06-07 NOTE — Assessment & Plan Note (Addendum)
 Established problem Uncontrolled.  Melanie Cole has stopped atorvastatin.  LDL-direct 214 mg/dL (086 mg/dl 57/8469). Melanie Cole is not at goal of taking statin. Restart: atorvastatin 20 mg daily.  If tolerating, then advance to high dose

## 2023-06-07 NOTE — Assessment & Plan Note (Signed)
 Established problem Well Controlled. Patient is at goal of SBP < 130. Melanie Cole has stopped her diltiazem. She is no longer taking any antihypertensives.   Significant weight loss has relieved Melanie Cole of the need to take antihypertenisve medications.  Will monitor BP at office visit, but as long as weight reduction remains, will not need visits specifically for hypertension.

## 2023-06-07 NOTE — Patient Instructions (Signed)
 Great A1c.  No longer in range of diabetes, or even Pre-diabetes.  Blood pressure is also great.   Off meds is also great.    Let me know if you need adjectmnets in Mounjaro dose.   Consider starting a Multivitamin just beccause of how Mounjaro can change our appetite.    If your chest pain becomes worse, please let Dr Wyley Hack know.   We are checking your LDL cholesterol,  LDL cholesterol, kidney function, and uric acid levels. .   Dr Jawan Chavarria will let you know it they are abnormal.  If they are normal, he will send your a message through your MyChart.

## 2023-06-07 NOTE — Assessment & Plan Note (Signed)
 Recurrent concern Onset: yesterday Location: left lower precordial Quality: sharp Severity: moderate Function: occurred at rest in bed Duration: ~ 10 minutes Pattern: solitary Radiation: into back Relief: spontaneous Precipitant: none Associated Symptoms: No tearing sensation described No migration of pain described No significant impact on ability to function       Trauma (Acute or Chronic): no Prior Diagnostic Testing or Treatments:  Nuc Med cardiac study 2019: low risk significant CAD Relevant PMH/PSH: DMT2, Hypertension, hyperlipidemia  VS reviewed Chest: Tender to palpation left lower precordium, no rash  A/ Atypical presentation in patient with moderate risk CAD P/ Monitor for recurrence, or worsening. Report changes to our office.

## 2023-06-07 NOTE — Assessment & Plan Note (Signed)
 Established problem Well Controlled. Patient is at goal of no recurrence of acute arthritis. Will recheck UA with her wt loss.

## 2023-06-08 LAB — BASIC METABOLIC PANEL
BUN/Creatinine Ratio: 17 (ref 9–23)
BUN: 12 mg/dL (ref 6–24)
CO2: 23 mmol/L (ref 20–29)
Calcium: 10 mg/dL (ref 8.7–10.2)
Chloride: 102 mmol/L (ref 96–106)
Creatinine, Ser: 0.72 mg/dL (ref 0.57–1.00)
Glucose: 73 mg/dL (ref 70–99)
Potassium: 4.3 mmol/L (ref 3.5–5.2)
Sodium: 143 mmol/L (ref 134–144)
eGFR: 97 mL/min/{1.73_m2} (ref >59)

## 2023-06-08 LAB — LDL CHOLESTEROL, DIRECT: LDL Direct: 214 mg/dL — ABNORMAL HIGH (ref 0–99)

## 2023-06-08 LAB — URIC ACID: Uric Acid: 5.7 mg/dL (ref 3.0–7.2)

## 2023-06-12 MED ORDER — ATORVASTATIN CALCIUM 20 MG PO TABS: 20.0000 mg | ORAL_TABLET | Freq: Every day | ORAL | 3 refills | Status: AC

## 2023-06-12 NOTE — Assessment & Plan Note (Addendum)
 Melanie Cole

## 2023-06-12 NOTE — Addendum Note (Signed)
 Addended by: Acquanetta Belling D on: 06/12/2023 03:19 PM   Modules accepted: Orders

## 2023-07-06 ENCOUNTER — Ambulatory Visit: Payer: 59 | Admitting: Family Medicine

## 2023-09-11 ENCOUNTER — Other Ambulatory Visit (HOSPITAL_COMMUNITY): Payer: Self-pay

## 2023-10-06 ENCOUNTER — Other Ambulatory Visit: Payer: Self-pay | Admitting: Family Medicine

## 2023-10-06 DIAGNOSIS — E119 Type 2 diabetes mellitus without complications: Secondary | ICD-10-CM

## 2023-10-19 ENCOUNTER — Ambulatory Visit (INDEPENDENT_AMBULATORY_CARE_PROVIDER_SITE_OTHER): Admitting: Family Medicine

## 2023-10-19 ENCOUNTER — Encounter: Payer: Self-pay | Admitting: Family Medicine

## 2023-10-19 VITALS — BP 120/78 | HR 97 | Ht 63.0 in | Wt 151.4 lb

## 2023-10-19 DIAGNOSIS — M542 Cervicalgia: Secondary | ICD-10-CM

## 2023-10-19 DIAGNOSIS — T148XXA Other injury of unspecified body region, initial encounter: Secondary | ICD-10-CM

## 2023-10-19 DIAGNOSIS — M546 Pain in thoracic spine: Secondary | ICD-10-CM

## 2023-10-19 DIAGNOSIS — I152 Hypertension secondary to endocrine disorders: Secondary | ICD-10-CM

## 2023-10-19 DIAGNOSIS — E785 Hyperlipidemia, unspecified: Secondary | ICD-10-CM

## 2023-10-19 DIAGNOSIS — E119 Type 2 diabetes mellitus without complications: Secondary | ICD-10-CM

## 2023-10-19 DIAGNOSIS — E1159 Type 2 diabetes mellitus with other circulatory complications: Secondary | ICD-10-CM

## 2023-10-19 DIAGNOSIS — E1169 Type 2 diabetes mellitus with other specified complication: Secondary | ICD-10-CM | POA: Diagnosis not present

## 2023-10-19 DIAGNOSIS — R791 Abnormal coagulation profile: Secondary | ICD-10-CM

## 2023-10-19 DIAGNOSIS — G8929 Other chronic pain: Secondary | ICD-10-CM

## 2023-10-19 DIAGNOSIS — R09A2 Foreign body sensation, throat: Secondary | ICD-10-CM

## 2023-10-19 DIAGNOSIS — Z7689 Persons encountering health services in other specified circumstances: Secondary | ICD-10-CM

## 2023-10-19 DIAGNOSIS — R1011 Right upper quadrant pain: Secondary | ICD-10-CM

## 2023-10-19 LAB — POCT GLYCOSYLATED HEMOGLOBIN (HGB A1C): HbA1c, POC (controlled diabetic range): 5.2 % (ref 0.0–7.0)

## 2023-10-19 NOTE — Patient Instructions (Addendum)
  PLanning on at ultrasound of your thyroid to see if it could be casuing your throat sensation (globus).  Planning a ultrasound of your right upper abdominal quadrant to see if the liver could be contributing to this pain.   If the thyroid ultrasound does not explain your globus sensation, then will likely recommend a Barium Esophagram to look for esophageal blockages.   We are checking liver, electrolytes, kidney and pancreas blood levels today.   We are checking your cholesterol level.    Your A1c was in normal range.    We are checking your kidney health looking for protein in your urine.   Will plan on continuing the Mounjaro  at current dose but will decrease to 10 mg [place] weekly at next refill.   CDC does recommend you take the Pneumoncoccal vaccination (Prevnar20 or Prevnar 21).  It should be covered by your insurance pharmacy plan.  Though it may still have a copay.

## 2023-10-20 ENCOUNTER — Encounter: Payer: Self-pay | Admitting: Family Medicine

## 2023-10-20 DIAGNOSIS — G8929 Other chronic pain: Secondary | ICD-10-CM | POA: Insufficient documentation

## 2023-10-20 DIAGNOSIS — R09A2 Foreign body sensation, throat: Secondary | ICD-10-CM | POA: Insufficient documentation

## 2023-10-20 LAB — CBC WITH DIFFERENTIAL/PLATELET
Basophils Absolute: 0 x10E3/uL (ref 0.0–0.2)
Basos: 1 %
EOS (ABSOLUTE): 0.1 x10E3/uL (ref 0.0–0.4)
Eos: 2 %
Hematocrit: 47 % — ABNORMAL HIGH (ref 34.0–46.6)
Hemoglobin: 15.1 g/dL (ref 11.1–15.9)
Immature Grans (Abs): 0 x10E3/uL (ref 0.0–0.1)
Immature Granulocytes: 0 %
Lymphocytes Absolute: 2.1 x10E3/uL (ref 0.7–3.1)
Lymphs: 43 %
MCH: 30.1 pg (ref 26.6–33.0)
MCHC: 32.1 g/dL (ref 31.5–35.7)
MCV: 94 fL (ref 79–97)
Monocytes Absolute: 0.3 x10E3/uL (ref 0.1–0.9)
Monocytes: 6 %
Neutrophils Absolute: 2.3 x10E3/uL (ref 1.4–7.0)
Neutrophils: 48 %
Platelets: 324 x10E3/uL (ref 150–450)
RBC: 5.02 x10E6/uL (ref 3.77–5.28)
RDW: 13.1 % (ref 11.7–15.4)
WBC: 4.8 x10E3/uL (ref 3.4–10.8)

## 2023-10-20 LAB — PROTIME-INR
INR: 0.9 (ref 0.9–1.2)
Prothrombin Time: 10.1 s (ref 9.1–12.0)

## 2023-10-20 LAB — MICROALBUMIN / CREATININE URINE RATIO
Creatinine, Urine: 164.1 mg/dL
Microalb/Creat Ratio: 6 mg/g{creat} (ref 0–29)
Microalbumin, Urine: 10.5 ug/mL

## 2023-10-20 LAB — CMP14+EGFR
ALT: 13 IU/L (ref 0–32)
AST: 15 IU/L (ref 0–40)
Albumin: 4.4 g/dL (ref 3.8–4.9)
Alkaline Phosphatase: 87 IU/L (ref 44–121)
BUN/Creatinine Ratio: 16 (ref 9–23)
BUN: 11 mg/dL (ref 6–24)
Bilirubin Total: 0.7 mg/dL (ref 0.0–1.2)
CO2: 20 mmol/L (ref 20–29)
Calcium: 9.7 mg/dL (ref 8.7–10.2)
Chloride: 103 mmol/L (ref 96–106)
Creatinine, Ser: 0.67 mg/dL (ref 0.57–1.00)
Globulin, Total: 3 g/dL (ref 1.5–4.5)
Glucose: 84 mg/dL (ref 70–99)
Potassium: 4.1 mmol/L (ref 3.5–5.2)
Sodium: 142 mmol/L (ref 134–144)
Total Protein: 7.4 g/dL (ref 6.0–8.5)
eGFR: 102 mL/min/1.73 (ref >59)

## 2023-10-20 LAB — LDL CHOLESTEROL, DIRECT: LDL Direct: 152 mg/dL — ABNORMAL HIGH (ref 0–99)

## 2023-10-20 LAB — APTT: aPTT: 25 s (ref 24–33)

## 2023-10-20 LAB — LIPASE: Lipase: 70 U/L (ref 14–72)

## 2023-10-20 LAB — TSH: TSH: 0.648 u[IU]/mL (ref 0.450–4.500)

## 2023-10-20 NOTE — Assessment & Plan Note (Signed)
 Established problem Well Controlled with weight loss and lifestyle changes.  Patient is at goal of <130/80. No signs of complications, medication side effects, or red flags. Continue lifestyle and maintain current weight.

## 2023-10-20 NOTE — Progress Notes (Signed)
 Melanie Cole is alone Sources of clinical information for visit is/are patient. Nursing assessment for this office visit was reviewed with the patient for accuracy and revision.     Previous Report(s) Reviewed: historical medical records, lab reports, and x-ray reports     10/19/2023    8:34 AM  Depression screen PHQ 2/9  Decreased Interest 0  Down, Depressed, Hopeless 0  PHQ - 2 Score 0  Altered sleeping 0  Tired, decreased energy 0  Change in appetite 0  Feeling bad or failure about yourself  0  Trouble concentrating 0  Moving slowly or fidgety/restless 0  Suicidal thoughts 0  PHQ-9 Score 0   Flowsheet Row Office Visit from 10/19/2023 in [facility] - A Dept Of [place]. [facility] Office Visit from 06/07/2023 in [facility] - A Dept Of [place]. [facility] Office Visit from 12/01/2022 in [facility] - A Dept Of Jolynn VEAR. [facility]  Thoughts that you would be better off dead, or of hurting yourself in some way Not at all Not at all Not at all  PHQ-9 Total Score 0 0 0       03/10/2020   11:30 AM  Fall Risk   Falls in the past year? 0  Number falls in past yr: 0  Injury with Fall? 0       10/19/2023    8:34 AM 06/07/2023    8:27 AM 12/01/2022    3:27 PM  PHQ9 SCORE ONLY  PHQ-9 Total Score 0 0 0    There are no preventive care reminders to display for this patient.  Health Maintenance Due  Topic Date Due   OPHTHALMOLOGY EXAM  Never done   Diabetic kidney evaluation - Urine ACR  Never done   Pneumococcal Vaccine 75-56 Years old (1 of 2 - PCV) Never done   Hepatitis B Vaccines (1 of 3 - 19+ 3-dose series) Never done   Zoster Vaccines- Shingrix (1 of 2) Never done   COVID-19 Vaccine (3 - Moderna risk series) 01/23/2020      History/P.E. limitations: none  There are no preventive care reminders to display for this patient.  Diabetes Health Maintenance Due  Topic Date Due   OPHTHALMOLOGY  EXAM  Never done   HEMOGLOBIN A1C  04/20/2024    Health Maintenance Due  Topic Date Due   OPHTHALMOLOGY EXAM  Never done   Diabetic kidney evaluation - Urine ACR  Never done   Pneumococcal Vaccine 55-2 Years old (1 of 2 - PCV) Never done   Hepatitis B Vaccines (1 of 3 - 19+ 3-dose series) Never done   Zoster Vaccines- Shingrix (1 of 2) Never done   COVID-19 Vaccine (3 - Moderna risk series) 01/23/2020     Chief Complaint  Patient presents with   Medical Management of Chronic Issues     --------------------------------------------------------------------------------------------------------------------------------------------- Visit Problem List with A/P  No problem-specific Assessment & Plan notes found for this encounter.

## 2023-10-20 NOTE — Assessment & Plan Note (Signed)
 Onset: few months ago Location: over right upper abdomin to right lower ribs Quality: sharp Severity: severe Function: no impairment Pattern: intermittent, last few minutes Course: stable Radiation: none Relief: none  Precipitant: no known trauma, S/P Lap Chole for chronic cholecystitis 2021. Associated Symptoms:    - rare nausea, no vomiting, no change in bowel habits, no blood in BM, no relation to eating Prior Diagnostic Testing or Treatments: 08/2019 abdomin US  showed 1.4 cm calculus in the gallbladder lumen. No gallbladder wall thickening or pericholecystic fluid. 07/2019 CT abdomin unremarkable.   A/ RUQ intermittent pain/right anterior lower rib cage pain - Suspect benign cause. Uncertain if origin from visceral/parietal abdominal pain or from ribcage.    P/ Will Check RUQ US  and LFTs. Monitor for changes.

## 2023-10-20 NOTE — Assessment & Plan Note (Addendum)
 Established problem. Wt Readings from Last 3 Encounters:  10/19/23 151 lb 6 oz (68.7 kg)  06/07/23 157 lb 2 oz (71.3 kg)  03/22/23 172 lb (78 kg)  Estimated body mass index is 26.81 kg/m as calculated from the following:   Height as of this encounter: 5' 3 (1.6 m).   Weight as of this encounter: 151 lb 6 oz (68.7 kg).   Melanie Cole has lost over 50 pounds since starting Mounjaro .   No abdominal pain other than RUQ mentioned above.  Stable. Patient is achieving sustained weight loss She would like to lose an additional ten pounds.  No signs of complications, medication side effects, or red flags. Continue current medications and other regiments. Melanie Cole wants to consider reducing Mounjaro  dose at next refill. Checking lipase

## 2023-10-20 NOTE — Assessment & Plan Note (Signed)
 Established problem Taking atorv 20 mg dailyAwait Lipid panel.

## 2023-10-20 NOTE — Assessment & Plan Note (Signed)
 Lab Results  Component Value Date   HGBA1C 5.2 10/19/2023   HGBA1C 5.4 06/07/2023   HGBA1C 6.9 (H) 10/03/2022   Established problem. Adequate glycemic control.  Pt is tolerating the current medication regiment. Continue current treatment plan.              Statin: atorvastatin              Recent eGFR: Estimated Glomerular Filtration Rate: 102.1 mL/min/1.50m2 (by CKD-EPI based on SCr of 0.67 mg/dL).              ACEI: ACEi adverse   Eye Exam: Need ROI  Diet pattern: Melanie Cole feels like should could improve by decreasing sweets in her diet.

## 2023-10-20 NOTE — Assessment & Plan Note (Signed)
 New complaint Onset few months ago. Sensation present continuously. No dysphagia. No odynophagia. No GERD history. No history of esophagitis.  No unintentional weight loss.  Patient has lost weight while taking GLP-1.   Exam Neck: no masses, trachea midline, palpable enlarged without nodularity nor asymmetry, No cervical LAN Normal phonation  A/ Globus sensation - Broad categories of motility issue, muscosal problem, or structura lesion.   P/ Thyroid US  to look for thyromegaly If US  unrevealing, likely Barium swallow  Consider empiric acid suppression

## 2023-10-23 ENCOUNTER — Ambulatory Visit: Payer: Self-pay | Admitting: Family Medicine

## 2023-10-23 DIAGNOSIS — E119 Type 2 diabetes mellitus without complications: Secondary | ICD-10-CM

## 2023-10-23 DIAGNOSIS — R1319 Other dysphagia: Secondary | ICD-10-CM

## 2023-10-23 DIAGNOSIS — E1169 Type 2 diabetes mellitus with other specified complication: Secondary | ICD-10-CM

## 2023-10-23 DIAGNOSIS — R09A2 Foreign body sensation, throat: Secondary | ICD-10-CM

## 2023-10-23 NOTE — Telephone Encounter (Signed)
 We discussed her LDL is still elevated even with her significant wieght loss.  We agreed she would restart taking her atorvastatin  20 mg each morning.    Melanie Cole will come in for a lab visit in 6 weeks to have her LDL-Direct checked.

## 2023-10-24 ENCOUNTER — Ambulatory Visit
Admission: RE | Admit: 2023-10-24 | Discharge: 2023-10-24 | Disposition: A | Discharge status: Home / Self Care | Source: Ambulatory Visit | Attending: Family Medicine | Admitting: Family Medicine

## 2023-10-24 DIAGNOSIS — G8929 Other chronic pain: Secondary | ICD-10-CM

## 2023-10-24 DIAGNOSIS — Z7689 Persons encountering health services in other specified circumstances: Secondary | ICD-10-CM

## 2023-10-24 NOTE — Progress Notes (Signed)
 Spoke with patient. Made lab appt for 9/2 at 4:15. Nelson Land, [name]

## 2023-10-30 NOTE — Telephone Encounter (Signed)
 I spoke with Melanie Cole about her reassuring thyroid  US .  It does not appear to have a role in her sensation of globus.   We agreed to proceed with a diagnostic barium esophagram to look for en esophageal origin of this sensation.

## 2023-11-16 ENCOUNTER — Other Ambulatory Visit

## 2023-12-05 ENCOUNTER — Other Ambulatory Visit

## 2023-12-06 ENCOUNTER — Encounter: Payer: Self-pay | Admitting: Family Medicine

## 2023-12-06 DIAGNOSIS — E119 Type 2 diabetes mellitus without complications: Secondary | ICD-10-CM

## 2023-12-07 ENCOUNTER — Other Ambulatory Visit

## 2023-12-07 ENCOUNTER — Ambulatory Visit
Admission: RE | Admit: 2023-12-07 | Discharge: 2023-12-07 | Disposition: A | Discharge status: Home / Self Care | Source: Ambulatory Visit | Attending: Family Medicine | Admitting: Family Medicine

## 2023-12-07 DIAGNOSIS — R1319 Other dysphagia: Secondary | ICD-10-CM

## 2023-12-07 DIAGNOSIS — R09A2 Foreign body sensation, throat: Secondary | ICD-10-CM

## 2023-12-07 DIAGNOSIS — E1169 Type 2 diabetes mellitus with other specified complication: Secondary | ICD-10-CM

## 2023-12-08 LAB — LDL CHOLESTEROL, DIRECT: LDL Direct: 120 mg/dL — ABNORMAL HIGH (ref 0–99)

## 2023-12-08 MED ORDER — MOUNJARO 7.5 MG/0.5ML ~~LOC~~ SOAJ: 7.5000 mg | SUBCUTANEOUS | 0 refills | Status: DC

## 2023-12-11 ENCOUNTER — Ambulatory Visit: Payer: Self-pay | Admitting: Family Medicine

## 2023-12-12 NOTE — Telephone Encounter (Signed)
 I spoke with Melanie Cole about her Barium Swallow esophagram results.  Discussed the tablet not delayed passage at GE junction.   Melanie Cole raised reasonable concern as to whether her tirzepatide  could cause esophageal dysmotility.  My brief review of the literature did not have reveal reports of tirzepatide -associated esophageal dysmotility.    I will request Melanie Cole' gastroenterologist, Dr Leigh, to look at the Barium Esophagram's results with the specific question whether this finding warrants consultation with him.   For now, will observe.

## 2023-12-13 ENCOUNTER — Telehealth: Payer: Self-pay | Admitting: *Deleted

## 2023-12-13 NOTE — Telephone Encounter (Signed)
-----   Message from Elspeth SHAUNNA Naval sent at 12/12/2023  5:33 PM EDT ----- Regarding: EGD to be scheduled POD A RN, I spoke with Dr. Keneth about this patient.  I think an EGD is reasonable to evaluate the barium study change and potentially dilate her esophagus to see if that will help her symptoms.  Can you book her for an EGD at the [facility] if she is willing?  Of note I believe she is on Mounjaro  which would need to be held for this exam.  Thanks ----- Message ----- From: McDiarmid, Krystal BIRCH, MD Sent: 12/12/2023  11:40 AM EDT To: Elspeth SHAUNNA Naval, MD Subject: Question about need for consultation           Dear Dr Naval,  Apologies for the intrusion, but I'd appreciate your thoughts on a recent Barium Swallow for our mutual patient, [name]. You saw her in 2021 for persistent epigastric pain; her EGD was unremarkable aside from a small hiatal hernia.  She now reports a persistent globus sensation over several months, with normal external exam and thyroid  ultrasound. A Barium esophagram-ordered to assess for Zenker's (though not classic)-incidentally showed delayed passage of a barium tablet at the GE junction, possibly suggesting a motility issue.  In the absence of esophageal dysphagia, do you think this warrants GI referral for repeat EGD?  Thanks as always-I value your opinion.  Warm regards, Krystal Krystal Krystal McDiarmid, Md Family & Geriatric Medicine [facility].

## 2023-12-13 NOTE — Telephone Encounter (Signed)
-----   Message from Elspeth SHAUNNA Naval sent at 12/12/2023  5:32 PM EDT ----- Regarding: RE: Question about need for consultation Hi Arneisha Kincannon,  Thanks for reaching out.  I hope you are well.  Given there is some delay at the GE junction for a few minutes and her last endoscopy was back in 2021, I do think a repeat EGD would be useful and at least dilate the area to see if that is something we can help with.  I can have the office help coordinate for her if she is willing to proceed.  Thanks.   Marcey ----- Message ----- From: Jenyfer Trawick, Krystal BIRCH, MD Sent: 12/12/2023  11:40 AM EDT To: Elspeth SHAUNNA Naval, MD Subject: Question about need for consultation           Dear Dr Naval,  Apologies for the intrusion, but I'd appreciate your thoughts on a recent Barium Swallow for our mutual patient, [name]. You saw her in 2021 for persistent epigastric pain; her EGD was unremarkable aside from a small hiatal hernia.  She now reports a persistent globus sensation over several months, with normal external exam and thyroid  ultrasound. A Barium esophagram-ordered to assess for Zenker's (though not classic)-incidentally showed delayed passage of a barium tablet at the GE junction, possibly suggesting a motility issue.  In the absence of esophageal dysphagia, do you think this warrants GI referral for repeat EGD?  Thanks as always-I value your opinion.  Warm regards, Krystal Krystal Krystal Keidan Aumiller, Md Family & Geriatric Medicine [facility].

## 2023-12-13 NOTE — Telephone Encounter (Signed)
 I have spoken to patient regarding Dr Hassan recommendation for endoscopy. She has been scheduled for endoscopy on 01/18/24 and in person previsit on 12/28/23. She is also advised that she will need to hold monjauro 7 days prior to her endoscopy procedure.

## 2023-12-28 ENCOUNTER — Ambulatory Visit (AMBULATORY_SURGERY_CENTER): Admitting: *Deleted

## 2023-12-28 VITALS — Ht 63.0 in | Wt 147.0 lb

## 2023-12-28 DIAGNOSIS — R09A2 Foreign body sensation, throat: Secondary | ICD-10-CM

## 2023-12-28 NOTE — Progress Notes (Addendum)
 Pt's name and DOB verified at the beginning of the pre-visit with 2 identifiers  Pt denies any difficulty with ambulating,sitting, laying down or rolling side to side  Pt has no issues moving head neck or swallowing  No egg or soy allergy known to patient   No issues known to pt with past sedation  No FH of Malignant Hyperthermia  Pt is not on home 02   Pt is not on blood thinners   Pt denies issues with constipation   Pt has frequent issues with constipation RN instructed pt to use Miralax per bottles instructions a week before prep days. Pt states they will  Pt is not on dialysis  Pt denise any abnormal heart rhythms   Patient's chart reviewed by Norleen Schillings CNRA prior to pre-visit and patient appropriate for the [facility].  Pre-visit completed and red dot placed by patient's name on their procedure day (on provider's schedule).     Visit in person  Pt states weight is 147 lb  Pt given  both [facility] main # and MD on call # prior to instructions.  Informed pt to come in at the time discussed and is shown on PV instructions.  Pt instructed to use Singlecare.com or GoodRx for a price reduction on prep  Instructed pt where to find PV instructions in My Chart  Instructed pt on all aspects of written instructions including med holds clothing to wear and foods to eat and not eat as well as after procedure legal restrictions and to call MD on call if needed.. Pt states understanding. Instructed pt to review instructions again prior to procedure and call main # given if has any questions or any issues. Pt states they will.

## 2024-01-02 ENCOUNTER — Other Ambulatory Visit: Payer: Self-pay | Admitting: Family Medicine

## 2024-01-02 DIAGNOSIS — E119 Type 2 diabetes mellitus without complications: Secondary | ICD-10-CM

## 2024-01-12 ENCOUNTER — Encounter: Payer: Self-pay | Admitting: Gastroenterology

## 2024-01-18 ENCOUNTER — Ambulatory Visit (AMBULATORY_SURGERY_CENTER): Admitting: Gastroenterology

## 2024-01-18 ENCOUNTER — Encounter: Payer: Self-pay | Admitting: Gastroenterology

## 2024-01-18 VITALS — BP 128/79 | HR 83 | Temp 98.0°F | Resp 15 | Ht 63.0 in | Wt 147.0 lb

## 2024-01-18 DIAGNOSIS — K449 Diaphragmatic hernia without obstruction or gangrene: Secondary | ICD-10-CM

## 2024-01-18 DIAGNOSIS — R1310 Dysphagia, unspecified: Secondary | ICD-10-CM

## 2024-01-18 DIAGNOSIS — R933 Abnormal findings on diagnostic imaging of other parts of digestive tract: Secondary | ICD-10-CM

## 2024-01-18 DIAGNOSIS — R09A2 Foreign body sensation, throat: Secondary | ICD-10-CM

## 2024-01-18 MED ORDER — OMEPRAZOLE 40 MG PO CPDR: 40.0000 mg | DELAYED_RELEASE_CAPSULE | Freq: Every day | ORAL | 0 refills | Status: AC

## 2024-01-18 MED ORDER — SODIUM CHLORIDE 0.9 % IV SOLN: 500.0000 mL | INTRAVENOUS | Status: DC

## 2024-01-18 NOTE — Op Note (Signed)
 [facility] Patient Name: Melanie Cole Procedure Date: 01/18/2024 3:34 PM MRN: [id number] Endoscopist: Elspeth P. Leigh , MD, [id number] Age: 58 Referring MD:  Date of Birth: 01/29/1966 Gender: Female Account #: [id number] Procedure:                Upper GI endoscopy Indications:              Globus - occasional dysphagia, GERD - currently not                            on therapy, barium swallow done to evaluate this                            showed tablet unable to advance beyond GEJ                            secondary to spasm or stricture, etc. EGD to                            evaluate. Medicines:                Monitored Anesthesia Care Procedure:                Pre-Anesthesia Assessment:                           - Prior to the procedure, a History and Physical                            was performed, and patient medications and                            allergies were reviewed. The patient's tolerance of                            previous anesthesia was also reviewed. The risks                            and benefits of the procedure and the sedation                            options and risks were discussed with the patient.                            All questions were answered, and informed consent                            was obtained. Prior Anticoagulants: The patient has                            taken no anticoagulant or antiplatelet agents. ASA                            Grade Assessment: II - A patient with mild systemic  disease. After reviewing the risks and benefits,                            the patient was deemed in satisfactory condition to                            undergo the procedure.                           After obtaining informed consent, the endoscope was                            passed under direct vision. Throughout the                            procedure, the patient's blood pressure, pulse, and                             oxygen saturations were monitored continuously. The                            GIF [id number] #[id number] was introduced through the                            mouth, and advanced to the second part of duodenum.                            The upper GI endoscopy was accomplished without                            difficulty. The patient tolerated the procedure                            well. Scope In: Scope Out: Findings:                 Esophagogastric landmarks were identified: the                            Z-line was found at 38 cm, the gastroesophageal                            junction was found at 38 cm and the upper extent of                            the gastric folds was found at 40 cm from the                            incisors.                           A 2 cm hiatal hernia was present.                           The exam of the esophagus was  otherwise normal.                           A TTS dilator was passed through the scope.                            Dilation with an 18-19-20 mm balloon dilator was                            performed to 18 mm, 19 mm and 20 mm at the                            gastroesophageal junction and distal esophagus. No                            heme noted.                           The entire examined stomach was normal.                           The examined duodenum was normal. Complications:            No immediate complications. Estimated blood loss:                            Minimal. Estimated Blood Loss:     Estimated blood loss was minimal. Impression:               - Esophagogastric landmarks identified.                           - 2 cm hiatal hernia.                           - Normal esophagus otherwise - GEJ / lower                            esophagus dilated to 20mm given barium study                            findings and dysphagia / globus, see if it helps                            symptoms.                            - Normal stomach.                           - Normal examined duodenum. Recommendation:           - Patient has a contact number available for                            emergencies. The signs and symptoms of potential  delayed complications were discussed with the                            patient. Return to normal activities tomorrow.                            Written discharge instructions were provided to the                            patient.                           - Resume previous diet.                           - Continue present medications.                           - Await course post dilation                           - Start omeprazole  40mg  / day for 30 day trial to                            see if helps globus symptoms.                           - If symptoms persist despite PPI and dilation,                            consideration for esophageal manometry and ENT                            evaluation, will discuss with the patient Elspeth SQUIBB. Vaishnav Demartin, MD 01/18/2024 4:04:09 PM This report has been signed electronically.

## 2024-01-18 NOTE — Patient Instructions (Signed)
 Resume previous diet. Continue present medications. Await course post dilation. Start omeprazole  40 mg/ day for 30 day trial to see if helps globus symptoms.  If symptoms persists despite PPI and dilation, consider for esophageal manometry and ENT evaluation. Handout provided on hiatal hernia and esophageal dilation.   YOU HAD AN ENDOSCOPIC PROCEDURE TODAY AT THE [facility]:   Refer to the procedure report that was given to you for any specific questions about what was found during the examination.  If the procedure report does not answer your questions, please call your gastroenterologist to clarify.  If you requested that your care partner not be given the details of your procedure findings, then the procedure report has been included in a sealed envelope for you to review at your convenience later.  YOU SHOULD EXPECT: Some feelings of bloating in the abdomen. Passage of more gas than usual.  Walking can help get rid of the air that was put into your GI tract during the procedure and reduce the bloating. If you had a lower endoscopy (such as a colonoscopy or flexible sigmoidoscopy) you may notice spotting of blood in your stool or on the toilet paper. If you underwent a bowel prep for your procedure, you may not have a normal bowel movement for a few days.  Please Note:  You might notice some irritation and congestion in your nose or some drainage.  This is from the oxygen used during your procedure.  There is no need for concern and it should clear up in a day or so.  SYMPTOMS TO REPORT IMMEDIATELY:  Following upper endoscopy (EGD)  Vomiting of blood or coffee ground material  New chest pain or pain under the shoulder blades  Painful or persistently difficult swallowing  New shortness of breath  Fever of 100F or higher  Black, tarry-looking stools  For urgent or emergent issues, a gastroenterologist can be reached at any hour by calling [phone]. Do not use MyChart  messaging for urgent concerns.    DIET:  We do recommend a small meal at first, but then you may proceed to your regular diet.  Drink plenty of fluids but you should avoid alcoholic beverages for 24 hours.  ACTIVITY:  You should plan to take it easy for the rest of today and you should NOT DRIVE or use heavy machinery until tomorrow (because of the sedation medicines used during the test).    FOLLOW UP: Our staff will call the number listed on your records the next business day following your procedure.  We will call around 7:15- 8:00 am to check on you and address any questions or concerns that you may have regarding the information given to you following your procedure. If we do not reach you, we will leave a message.     If any biopsies were taken you will be contacted by phone or by letter within the next 1-3 weeks.  Please call us  at [phone] if you have not heard about the biopsies in 3 weeks.    SIGNATURES/CONFIDENTIALITY: You and/or your care partner have signed paperwork which will be entered into your electronic medical record.  These signatures attest to the fact that that the information above on your After Visit Summary has been reviewed and is understood.  Full responsibility of the confidentiality of this discharge information lies with you and/or your care-partner.

## 2024-01-18 NOTE — Progress Notes (Signed)
 Report to PACU, RN, vss, BBS= Clear.

## 2024-01-18 NOTE — Progress Notes (Signed)
 [facility] History and Physical   Primary Care Physician:  McDiarmid, Krystal BIRCH, MD   Reason for Procedure:   Abnormal barium swallow, globus  Plan:    EGD with possible dilation      HPI: Melanie Cole is a 58 y.o. female  here for EGD with possible dilation. She had a barium swallow to evaluate globus, noted to have delay of tablet at the GEJ for a few minutes, concern for spasm vs. Stricture vs. Other. Last EGD 2021. She does have some occasional dysphagia, but globus is most bothersome. We discussed options. EGD to evaluate, she would want to dilate the esophagus at the GEJ to see if this helps her symptoms, based on barium study.     Otherwise feels well without any cardiopulmonary symptoms.   I have discussed risks / benefits of anesthesia and endoscopic procedure with Melanie Cole and they wish to proceed with the exams as outlined today.    Past Medical History:  Diagnosis Date   Acute right-sided thoracic back pain 10/30/2021   Adenomyosis    Atopic dermatitis 12/18/2012   Bloating 12/10/2015   Chest pain, unspecified 12/16/2013   Chronic idiopathic constipation 10/28/2021   Constipation    Constipation    Diabetes mellitus without complication (HCC)    Dysfunction of right eustachian tube 03/24/2021   Last Assessment & Plan:   Formatting of this note might be different from the original.  Right ear fullness.  Sounds like she was treated for right acute otitis media a few weeks ago.  Pain is for the most part resolved but she persists with fullness that is slowly improving.  She wants to make sure nothing damaging is going on.  EXAM shows normal left external canal and tympanic membrane.  Right    Essential hypertension, benign    Gastroesophageal reflux disease 10/28/2021   Globus sensation 10/20/2023   Hemangioma of liver 09/10/2010   09/09/10 Abdominopelvic CT found incidental 1.8 cm round mass in the left lobe of the liver which is likely a solitary  cavernous hemangioma     High blood pressure    High cholesterol    History of gestational diabetes    History of postmenopausal bleeding 12/16/2013   History of pulmonary function tests 2002   Normal values.   Hypertension    Hyperuricemia 11/11/2022   Possible podagra episode ~ 11/03/22     IBS (irritable bowel syndrome)    Lesion of liver 02/13/2015   MIGRAINE, UNSPEC., W/O INTRACTABLE MIGRAINE 06/01/2006        Mondor's disease    Paresthesias 12/10/2015   Pelvic pain 06/05/2019   Postmenopause    Pre-diabetes    Pure hypercholesterolemia 12/12/2013   ASCVD risk 4% 05/17/19     Sleep apnea    Urinary, incontinence, stress female 04/08/2014   Uterus, adenomyosis    Per patient's recall   Vasomotor flushing 12/16/2013   Vitamin D  deficiency     Past Surgical History:  Procedure Laterality Date   Bladder Stretched     CARDIOVASCULAR STRESS TEST  12/03/2000   Stress Echocargiogram - 12/03/2000, NORMAL   CHOLECYSTECTOMY N/A 11/07/2019   Procedure: LAPAROSCOPIC CHOLECYSTECTOMY;  Surgeon: Teresa Lonni HERO, MD;  Location: [facility];  Service: General;  Laterality: N/A;   COLONOSCOPY  05/05/2000    Dr Charlanne Jennye)- Internal Hemorrhoids   SPIROMETRY  12/03/2000   Normal Pulmonary Function Testing    TUBAL LIGATION      Prior to Admission  medications   Medication Sig Start Date End Date Taking? Authorizing Provider  atorvastatin  (LIPITOR) 20 MG tablet Take 1 tablet (20 mg total) by mouth daily. Patient not taking: Reported on 12/28/2023 06/12/23   McDiarmid, Krystal BIRCH, MD  tirzepatide  (MOUNJARO ) 7.5 MG/0.5ML Pen INJECT 7.5 MG SUBCUTANEOUSLY  ONCE A WEEK 01/02/24   McDiarmid, Krystal BIRCH, MD    Current Outpatient Medications  Medication Sig Dispense Refill   atorvastatin  (LIPITOR) 20 MG tablet Take 1 tablet (20 mg total) by mouth daily. (Patient not taking: Reported on 12/28/2023) 90 tablet 3   tirzepatide  (MOUNJARO ) 7.5 MG/0.5ML Pen INJECT 7.5 MG SUBCUTANEOUSLY  ONCE A WEEK 4 mL 2    Current Facility-Administered Medications  Medication Dose Route Frequency Provider Last Rate Last Admin   0.9 %  sodium chloride  infusion  500 mL Intravenous Continuous Emitt Maglione, Elspeth SQUIBB, MD        Allergies as of 01/18/2024 - Review Complete 01/18/2024  Allergen Reaction Noted   Ace inhibitors Other (See Comments) 03/03/2008   Amlodipine  besylate Other (See Comments) 10/13/2016    Family History  Problem Relation Age of Onset   Diabetes Mother    Thyroid  disease Mother    High blood pressure Mother    High Cholesterol Mother    Pancreatic cancer Father        Died age 52   Arrhythmia Father        Required a permanent pacemaker   Diabetes Father    Lung cancer Maternal Uncle    Colon cancer Maternal Uncle    Colon cancer Paternal Aunt    Lung cancer Paternal Uncle    Colon cancer Cousin        Colorectal Cancer   Breast cancer Cousin    Sleep apnea Neg Hx    Esophageal cancer Neg Hx    Stomach cancer Neg Hx    Rectal cancer Neg Hx    Crohn's disease Neg Hx     Social History   Socioeconomic History   Marital status: Married    Spouse name: Theatre stage manager   Number of children: 3   Years of education: 16   Highest education level: Not on file  Occupational History   Occupation: Copywriter, advertising: [place]   Occupation: Copywriter, advertising: Advertising copywriter    Comment: Social worker  Tobacco Use   Smoking status: Never    Passive exposure: Never   Smokeless tobacco: Never  Vaping Use   Vaping status: Never Used  Substance and Sexual Activity   Alcohol use: No   Drug use: No   Sexual activity: Yes    Birth control/protection: Post-menopausal  Other Topics Concern   Not on file  Social History Narrative   Married at age 42yrs old, [id number].    Registered Nurse, works as Social worker for [employer]   no tobacco   no alcohol   no illicit drugs      Colonoscopy_DrGupta,Int hemorr. - 05/05/2000,       Flexible  Sigmoidoscopy - 05/05/2000,      HBV vaccination 3-series completed `93 - 01/03/1992,                Social Drivers of Corporate investment banker Strain: Not on file  Food Insecurity: Not on file  Transportation Needs: Not on file  Physical Activity: Not on file  Stress: Not on file  Social Connections: Not on file  Intimate Partner  Violence: Not on file    Review of Systems: All other review of systems negative except as mentioned in the HPI.  Physical Exam: Vital signs BP (!) 144/88   Pulse 71   Temp 98 F (36.7 C) (Temporal)   Ht 5' 3 (1.6 m)   Wt 147 lb (66.7 kg)   LMP 11/16/2012   SpO2 98%   BMI 26.04 kg/m   General:   Alert,  Well-developed, pleasant and cooperative in NAD Lungs:  Clear throughout to auscultation.   Heart:  Regular rate and rhythm Abdomen:  Soft, nontender and nondistended.   Neuro/Psych:  Alert and cooperative. Normal mood and affect. A and O x 3  Marcey Naval, MD [facility]

## 2024-01-18 NOTE — Progress Notes (Signed)
 Called to room to assist during endoscopic procedure.  Patient ID and intended procedure confirmed with present staff. Received instructions for my participation in the procedure from the performing physician.

## 2024-01-18 NOTE — Progress Notes (Signed)
 Pt's states no medical or surgical changes since previsit or office visit.  Patient's CBG was 56 in pre-op. D5W gtt started at 1535. CRNA taking patient back and CBG will be rechecked.

## 2024-01-19 ENCOUNTER — Telehealth: Payer: Self-pay

## 2024-01-19 NOTE — Telephone Encounter (Signed)
  Follow up Call-     01/18/2024    3:24 PM  Call back number  Post procedure Call Back phone  # [phone]  Permission to leave phone message Yes     Patient questions:  Do you have a fever, pain , or abdominal swelling? No. Pain Score  0 *  Have you tolerated food without any problems? Yes.    Have you been able to return to your normal activities? Yes.    Do you have any questions about your discharge instructions: Diet   No. Medications  No. Follow up visit  No.  Do you have questions or concerns about your Care? No.  Actions: * If pain score is 4 or above: No action needed, pain <4.

## 2024-01-26 ENCOUNTER — Other Ambulatory Visit (HOSPITAL_BASED_OUTPATIENT_CLINIC_OR_DEPARTMENT_OTHER): Payer: Self-pay

## 2024-01-26 MED ORDER — FLUZONE 0.5 ML IM SUSY
0.5000 mL | PREFILLED_SYRINGE | Freq: Once | INTRAMUSCULAR | 0 refills | Status: AC
  Filled 2024-01-26: qty 0.5, 1d supply, fill #0

## 2024-03-13 ENCOUNTER — Encounter: Payer: Self-pay | Admitting: Family Medicine

## 2024-03-13 ENCOUNTER — Telehealth: Payer: Self-pay | Admitting: Adult Health

## 2024-03-13 DIAGNOSIS — E119 Type 2 diabetes mellitus without complications: Secondary | ICD-10-CM

## 2024-03-13 NOTE — Telephone Encounter (Signed)
 Pt called to Cancel appt  due to  not neediin git now   Appt Canceled

## 2024-03-14 MED ORDER — MOUNJARO 7.5 MG/0.5ML ~~LOC~~ SOAJ: 7.5000 mg | SUBCUTANEOUS | 5 refills | Status: AC

## 2024-03-19 ENCOUNTER — Ambulatory Visit: Payer: 59 | Admitting: Adult Health

## 2024-04-15 ENCOUNTER — Other Ambulatory Visit: Payer: Self-pay | Admitting: Family Medicine

## 2024-04-15 DIAGNOSIS — Z1231 Encounter for screening mammogram for malignant neoplasm of breast: Secondary | ICD-10-CM

## 2024-04-18 ENCOUNTER — Inpatient Hospital Stay: Admission: RE | Admit: 2024-04-18 | Discharge: 2024-04-18 | Attending: Family Medicine | Admitting: Family Medicine

## 2024-04-18 DIAGNOSIS — Z1231 Encounter for screening mammogram for malignant neoplasm of breast: Secondary | ICD-10-CM
